# Patient Record
Sex: Female | Born: 1945 | Race: White | Hispanic: No | Smoking: Former smoker
Health system: Southern US, Community
[De-identification: ages and names within clinical notes are randomized; demographics above are authoritative.]

## PROBLEM LIST (undated history)

## (undated) DIAGNOSIS — K219 Gastro-esophageal reflux disease without esophagitis: Secondary | ICD-10-CM

## (undated) DIAGNOSIS — C801 Malignant (primary) neoplasm, unspecified: Secondary | ICD-10-CM

## (undated) DIAGNOSIS — R002 Palpitations: Secondary | ICD-10-CM

## (undated) DIAGNOSIS — M1990 Unspecified osteoarthritis, unspecified site: Secondary | ICD-10-CM

## (undated) DIAGNOSIS — J40 Bronchitis, not specified as acute or chronic: Secondary | ICD-10-CM

## (undated) DIAGNOSIS — I10 Essential (primary) hypertension: Secondary | ICD-10-CM

## (undated) DIAGNOSIS — E785 Hyperlipidemia, unspecified: Secondary | ICD-10-CM

## (undated) DIAGNOSIS — I739 Peripheral vascular disease, unspecified: Secondary | ICD-10-CM

## (undated) DIAGNOSIS — J189 Pneumonia, unspecified organism: Secondary | ICD-10-CM

## (undated) HISTORY — DX: Malignant (primary) neoplasm, unspecified: C80.1

## (undated) HISTORY — DX: Hyperlipidemia, unspecified: E78.5

## (undated) HISTORY — DX: Essential (primary) hypertension: I10

## (undated) HISTORY — DX: Bronchitis, not specified as acute or chronic: J40

## (undated) HISTORY — DX: Peripheral vascular disease, unspecified: I73.9

## (undated) HISTORY — DX: Unspecified osteoarthritis, unspecified site: M19.90

## (undated) HISTORY — DX: Pneumonia, unspecified organism: J18.9

## (undated) HISTORY — DX: Gastro-esophageal reflux disease without esophagitis: K21.9

## (undated) HISTORY — DX: Palpitations: R00.2

---

## 1969-02-10 HISTORY — PX: ABDOMINAL HYSTERECTOMY: SHX81

## 2002-02-10 HISTORY — PX: PR VEIN BYPASS GRAFT,AORTO-FEM-POP: 35551

## 2004-02-11 HISTORY — PX: VENTRAL HERNIA REPAIR: SHX424

## 2008-12-11 ENCOUNTER — Emergency Department (HOSPITAL_COMMUNITY): Admission: EM | Admit: 2008-12-11 | Discharge: 2008-12-11 | Payer: Self-pay | Admitting: Emergency Medicine

## 2010-03-14 ENCOUNTER — Encounter (INDEPENDENT_AMBULATORY_CARE_PROVIDER_SITE_OTHER): Payer: Medicare Other

## 2010-03-14 ENCOUNTER — Ambulatory Visit: Admit: 2010-03-14 | Payer: Self-pay | Admitting: Vascular Surgery

## 2010-03-14 ENCOUNTER — Encounter (INDEPENDENT_AMBULATORY_CARE_PROVIDER_SITE_OTHER): Payer: Medicare Other | Admitting: Vascular Surgery

## 2010-03-14 DIAGNOSIS — Z48812 Encounter for surgical aftercare following surgery on the circulatory system: Secondary | ICD-10-CM

## 2010-03-14 DIAGNOSIS — I739 Peripheral vascular disease, unspecified: Secondary | ICD-10-CM

## 2010-03-14 DIAGNOSIS — I70219 Atherosclerosis of native arteries of extremities with intermittent claudication, unspecified extremity: Secondary | ICD-10-CM

## 2010-03-25 NOTE — Assessment & Plan Note (Signed)
OFFICE VISIT  CHRISSI, CROW DOB:  October 15, 1945                                       03/14/2010 CHART#:[id number]  HISTORY OF PRESENT ILLNESS:  The patient is a 65 year old female referred by Dr. Duanne Guess for followup of peripheral arterial disease.  The patient had aortobifemoral bypass graft performed in 2004 in [place] by Dr. Addison Lank.  She recently moved to [place] from [place] 3 years ago and wanted to have followup with a vascular surgeon in the area. Apparently before her aortobifemoral bypass she had 3 toes on the right foot which were black in color and these healed up after her operation. Of note, she did have to have a ventral hernia repair with mesh in 2005 after her aortobifemoral bypass graft.  She has not really had any problems since then.  She currently denies any symptoms of claudication or rest pain.  She has had no problems of nonhealing ulcers.  CHRONIC MEDICAL PROBLEMS:  Include hypertension and elevated cholesterol.  These are both currently followed by Dr. Duanne Guess.  PAST SURGICAL HISTORY:  As mentioned above.  In addition, she had a hysterectomy in 1971.  SOCIAL HISTORY:  She is single.  She has 2 children.  She is a former smoker, quit in 2004.  She does not consume alcohol regularly.  FAMILY HISTORY:  Remarkable for her mother who had pancreatic cancer, her father who had coronary disease at age 19.  Her brothers have both had cardiac disease at age less than 76.  REVIEW OF SYSTEMS:  Full 12 point review of systems was performed with the patient today.  This was remarkable for some recent weight gain. She is 5 feet 4 inches, 210 pounds.  She has some occasional pain in her thighs when walking.  This did not sound like claudication, it sounded more like muscle strain. CARDIAC:  She has a history of palpitations. GI:  She has a history of reflux, hiatal hernia. NEUROLOGIC:  She has some occasional headaches and  dizziness. MUSCULOSKELETAL:  She has multiple joint and arthritis pain. URINARY:  She has some urinary frequency. SKIN:  She recently had a rash on her right pretibial area. All other systems were negative.  MEDICATIONS: 1. Include Lipitor 40 mg once a day. 2. Aspirin 325 mg once a day. 3. Lisinopril 20 mg once a day. 4. Loratadine 10 mg once a day. 5. Nexium 40 mg once a day.  ALLERGIES:  She has an allergy listed to mercurochrome.  PHYSICAL EXAMINATION:  Vital signs:  Blood pressure is 106/69 in the left arm, heart rate 65 and regular, respirations 16.  HEENT: Unremarkable.  Neck:  Has 2+ carotid pulses without bruit.  Chest: Clear to auscultation.  Cardiac:  Regular rate and rhythm without murmur.  Abdomen:  Obese, soft, nontender, nondistended.  Well-healed midline incision with no obvious hernia.  Musculoskeletal:  Shows no major joint deformities.  Neurologic:  Shows symmetric upper extremity and lower extremity motor strength which is 5/5.  Skin:  Has a papular rash on the right pretibial area.  Otherwise no other ulcers or rashes. Extremities:  She has 2+ radial, femoral and dorsalis pedis pulses bilaterally.  She had bilateral ABIs performed today which were 1.12 on the right, 1.05 on the left with biphasic normal waveforms.  In summary, the patient has a history of peripheral arterial disease. She currently  is asymptomatic.  She has had a previous aortobifemoral bypass graft.  Will continue to follow her long-term to make sure that she does not have these symptoms or recurrence of problems.  She will be scheduled for repeat ABIs in 1 year's time.  She will follow up sooner if she has any problems before then.    Janetta Hora. Aaleigha Bozza, MD Electronically Signed  CEF/MEDQ  D:  03/14/2010  T:  03/15/2010  Job:  [id number]  cc:   Maryelizabeth Rowan, M.D.

## 2010-04-30 ENCOUNTER — Other Ambulatory Visit: Payer: Self-pay | Admitting: Family Medicine

## 2010-04-30 DIAGNOSIS — M8580 Other specified disorders of bone density and structure, unspecified site: Secondary | ICD-10-CM

## 2010-04-30 DIAGNOSIS — M1990 Unspecified osteoarthritis, unspecified site: Secondary | ICD-10-CM

## 2010-04-30 DIAGNOSIS — Z1231 Encounter for screening mammogram for malignant neoplasm of breast: Secondary | ICD-10-CM

## 2010-05-10 ENCOUNTER — Ambulatory Visit
Admission: RE | Admit: 2010-05-10 | Discharge: 2010-05-10 | Disposition: A | Discharge status: Home / Self Care | Payer: Medicare Other | Source: Ambulatory Visit | Attending: Family Medicine | Admitting: Family Medicine

## 2010-05-10 DIAGNOSIS — M8580 Other specified disorders of bone density and structure, unspecified site: Secondary | ICD-10-CM

## 2010-05-10 DIAGNOSIS — Z1231 Encounter for screening mammogram for malignant neoplasm of breast: Secondary | ICD-10-CM

## 2011-03-18 ENCOUNTER — Encounter (INDEPENDENT_AMBULATORY_CARE_PROVIDER_SITE_OTHER): Payer: Medicare Other | Admitting: *Deleted

## 2011-03-18 DIAGNOSIS — Z01818 Encounter for other preprocedural examination: Secondary | ICD-10-CM

## 2011-03-18 DIAGNOSIS — I70309 Unspecified atherosclerosis of unspecified type of bypass graft(s) of the extremities, unspecified extremity: Secondary | ICD-10-CM

## 2011-03-18 DIAGNOSIS — I70409 Unspecified atherosclerosis of autologous vein bypass graft(s) of the extremities, unspecified extremity: Secondary | ICD-10-CM

## 2011-03-18 DIAGNOSIS — Z48812 Encounter for surgical aftercare following surgery on the circulatory system: Secondary | ICD-10-CM

## 2011-03-21 ENCOUNTER — Other Ambulatory Visit: Payer: Self-pay | Admitting: *Deleted

## 2011-03-21 DIAGNOSIS — Z48812 Encounter for surgical aftercare following surgery on the circulatory system: Secondary | ICD-10-CM

## 2011-03-21 DIAGNOSIS — I739 Peripheral vascular disease, unspecified: Secondary | ICD-10-CM

## 2011-03-24 ENCOUNTER — Encounter: Payer: Self-pay | Admitting: Vascular Surgery

## 2012-02-11 DIAGNOSIS — J189 Pneumonia, unspecified organism: Secondary | ICD-10-CM

## 2012-02-11 HISTORY — DX: Pneumonia, unspecified organism: J18.9

## 2012-03-09 ENCOUNTER — Encounter: Payer: Self-pay | Admitting: Vascular Surgery

## 2012-03-17 ENCOUNTER — Encounter: Payer: Self-pay | Admitting: Neurosurgery

## 2012-03-18 ENCOUNTER — Ambulatory Visit (INDEPENDENT_AMBULATORY_CARE_PROVIDER_SITE_OTHER): Payer: Medicare Other | Admitting: Neurosurgery

## 2012-03-18 ENCOUNTER — Encounter (INDEPENDENT_AMBULATORY_CARE_PROVIDER_SITE_OTHER): Payer: Medicare Other | Admitting: *Deleted

## 2012-03-18 ENCOUNTER — Encounter: Payer: Self-pay | Admitting: Neurosurgery

## 2012-03-18 VITALS — BP 90/54 | HR 76 | Resp 16 | Ht 65.5 in | Wt 194.0 lb

## 2012-03-18 DIAGNOSIS — Z48812 Encounter for surgical aftercare following surgery on the circulatory system: Secondary | ICD-10-CM

## 2012-03-18 DIAGNOSIS — I739 Peripheral vascular disease, unspecified: Secondary | ICD-10-CM

## 2012-03-18 DIAGNOSIS — M25559 Pain in unspecified hip: Secondary | ICD-10-CM

## 2012-03-18 DIAGNOSIS — R209 Unspecified disturbances of skin sensation: Secondary | ICD-10-CM

## 2012-03-18 DIAGNOSIS — R200 Anesthesia of skin: Secondary | ICD-10-CM | POA: Insufficient documentation

## 2012-03-18 NOTE — Progress Notes (Signed)
[facility] P[facility]/PVD Office Note  CC: PAD surveillance Referring Physician: Fields  History of Present Illness: 67 year old female patient of Dr. Darrick Penna who is status post aortobifemoral bypass graft in 2005. The patient denies claudication or rest pain. The patient does have some complaints of numbness in her right third digit of her hand which is most likely carpal tunel according to the patient.  Past Medical History  Diagnosis Date  . Hypertension   . Hyperlipidemia   . Heart palpitations   . GERD (gastroesophageal reflux disease)     With Hiatal Hernia  . Arthritis   . Bronchitis     ROS: [x]  Positive   [ ]  Denies    General: [ ]  Weight loss, [ ]  Fever, [ ]  chills Neurologic: [ ]  Dizziness, [ ]  Blackouts, [ ]  Seizure [ ]  Stroke, [ ]  "Mini stroke", [ ]  Slurred speech, [ ]  Temporary blindness; [ ]  weakness in arms or legs, [ ]  Hoarseness Cardiac: [ ]  Chest pain/pressure, [ ]  Shortness of breath at rest [ ]  Shortness of breath with exertion, [ ]  Atrial fibrillation or irregular heartbeat Vascular: [ ]  Pain in legs with walking, [ ]  Pain in legs at rest, [ ]  Pain in legs at night,  [ ]  Non-healing ulcer, [ ]  Blood clot in vein/DVT,   Pulmonary: [ ]  Home oxygen, [ ]  Productive cough, [ ]  Coughing up blood, [ ]  Asthma,  [ ]  Wheezing Musculoskeletal:  [ ]  Arthritis, [ ]  Low back pain, [ ]  Joint pain Hematologic: [ ]  Easy Bruising, [ ]  Anemia; [ ]  Hepatitis Gastrointestinal: [ ]  Blood in stool, [ ]  Gastroesophageal Reflux/heartburn, [ ]  Trouble swallowing Urinary: [ ]  chronic Kidney disease, [ ]  on HD - [ ]  MWF or [ ]  TTHS, [ ]  Burning with urination, [ ]  Difficulty urinating Skin: [ ]  Rashes, [ ]  Wounds Psychological: [ ]  Anxiety, [ ]  Depression   Social History History  Substance Use Topics  . Smoking status: Former Smoker    Quit date: 04/11/2002  . Smokeless tobacco: Not on file  . Alcohol Use: No    Family History Family History  Problem  Relation Age of Onset  . Cancer Mother   . Heart disease Father   . Heart disease Brother   . Heart disease Brother     No Known Allergies  Current Outpatient Prescriptions  Medication Sig Dispense Refill  . aspirin 325 MG tablet Take 325 mg by mouth daily.      Marland Kitchen atorvastatin (LIPITOR) 40 MG tablet Take 40 mg by mouth daily.      Marland Kitchen esomeprazole (NEXIUM) 40 MG capsule Take 40 mg by mouth daily before breakfast.      . lisinopril (PRINIVIL,ZESTRIL) 20 MG tablet Take 20 mg by mouth daily.      Marland Kitchen loratadine (CLARITIN) 10 MG tablet Take 10 mg by mouth daily.        Physical Examination  Filed Vitals:   03/18/12 1404  BP: 90/54  Pulse: 76  Resp: 16    Body mass index is 31.79 kg/(m^2).  General:  WDWN in NAD Gait: Normal HEENT: WNL Eyes: Pupils equal Pulmonary: normal non-labored breathing , without Rales, rhonchi,  wheezing Cardiac: RRR, without  Murmurs, rubs or gallops; No carotid bruits Abdomen: soft, NT, no masses Skin: no rashes, ulcers noted Vascular Exam/Pulses: Palpable femoral and lower extremity pulses bilaterally  Extremities without ischemic changes, no Gangrene , no cellulitis; no open wounds;  Musculoskeletal: no muscle wasting or atrophy  Neurologic: A&O X 3; Appropriate Affect ; SENSATION: normal; MOTOR FUNCTION:  moving all extremities equally. Speech is fluent/normal  Non-Invasive Vascular Imaging: ABIs today were 0.94 and triphasic on the right, 1.08 and triphasic on the left which is consistent with previous exam one year ago  ASSESSMENT/PLAN: Asymptomatic patient will followup in one year with repeat ABIs. The patient's questions were encouraged and answered, she is in agreement with this plan.  Lauree Chandler ANP  Clinic M.D.: [facility]

## 2012-03-19 NOTE — Addendum Note (Signed)
Addended by: Sharee Pimple on: 03/19/2012 01:25 PM   Modules accepted: Orders

## 2013-03-17 ENCOUNTER — Encounter: Payer: Self-pay | Admitting: Family

## 2013-03-18 ENCOUNTER — Inpatient Hospital Stay (HOSPITAL_COMMUNITY): Admission: RE | Admit: 2013-03-18 | Payer: Medicare Other | Source: Ambulatory Visit

## 2013-03-18 ENCOUNTER — Ambulatory Visit: Payer: Medicare Other | Admitting: Family

## 2018-11-09 ENCOUNTER — Other Ambulatory Visit: Payer: Self-pay

## 2018-11-09 ENCOUNTER — Ambulatory Visit (HOSPITAL_COMMUNITY)
Admission: EM | Admit: 2018-11-09 | Discharge: 2018-11-09 | Disposition: A | Discharge status: Home / Self Care | Payer: Medicare Other | Attending: Family Medicine | Admitting: Family Medicine

## 2018-11-09 ENCOUNTER — Encounter (HOSPITAL_COMMUNITY): Payer: Self-pay | Admitting: Emergency Medicine

## 2018-11-09 DIAGNOSIS — H60332 Swimmer's ear, left ear: Secondary | ICD-10-CM

## 2018-11-09 DIAGNOSIS — H66002 Acute suppurative otitis media without spontaneous rupture of ear drum, left ear: Secondary | ICD-10-CM

## 2018-11-09 MED ORDER — FLUCONAZOLE 150 MG PO TABS: 150.0000 mg | ORAL_TABLET | Freq: Once | ORAL | 0 refills | Status: AC

## 2018-11-09 MED ORDER — AMOXICILLIN-POT CLAVULANATE 875-125 MG PO TABS: 1.0000 | ORAL_TABLET | Freq: Two times a day (BID) | ORAL | 0 refills | Status: AC

## 2018-11-09 MED ORDER — FLUTICASONE PROPIONATE 50 MCG/ACT NA SUSP: 1.0000 | Freq: Every day | NASAL | 0 refills | Status: AC

## 2018-11-09 MED ORDER — NEOMYCIN-POLYMYXIN-HC 3.5-10000-1 OT SUSP: 3.0000 [drp] | Freq: Three times a day (TID) | OTIC | 0 refills | Status: AC

## 2018-11-09 NOTE — ED Provider Notes (Signed)
[facility]    CSN: [id number] Arrival date & time: 11/09/18  1032      History   Chief Complaint Chief Complaint  Patient presents with  . Otalgia    HPI Diane Mendoza is a 73 y.o. female history of hypertension, hyperlipidemia, GERD, presenting today for evaluation of left ear pain.  Patient states that over the past week she initially had muffled sensation and sensation of water in her ear.  Recently she she has started to develop pain.  She feels as if her outer ear is itchy and wet.  She notes that she has history of previous issues with her ears.  She takes daily loratadine.  Denies fevers.  Denies associated congestion cough or sore throat.  Denies fevers.  HPI  Past Medical History:  Diagnosis Date  . Arthritis   . Bronchitis   . Cancer (HCC)    Cervical  . GERD (gastroesophageal reflux disease)    With Hiatal Hernia  . Heart palpitations   . Hyperlipidemia   . Hypertension   . Peripheral vascular disease ([place])   . Pneumonia Jan. 2014    Patient Active Problem List   Diagnosis Date Noted  . Peripheral vascular disease, unspecified ([place]) 03/18/2012  . Numbness of finger 03/18/2012  . Hip pain 03/18/2012    Past Surgical History:  Procedure Laterality Date  . ABDOMINAL HYSTERECTOMY  1971  . PR VEIN BYPASS GRAFT,AORTO-FEM-POP  2004   Done at [facility] in [place],  Dr. Gavin Pound  . VENTRAL HERNIA REPAIR  2006   x6 repairs with mesh insertion    OB History   No obstetric history on file.      Home Medications    Prior to Admission medications   Medication Sig Start Date End Date Taking? Authorizing Provider  amoxicillin-clavulanate (AUGMENTIN) 875-125 MG tablet Take 1 tablet by mouth every 12 (twelve) hours for 7 days. 11/09/18 11/16/18  Lakshya Mcgillicuddy C, PA-C  aspirin 325 MG tablet Take 325 mg by mouth daily.    [provider]  atorvastatin (LIPITOR) 40 MG tablet Take 40 mg by mouth daily.    [provider]   esomeprazole (NEXIUM) 40 MG capsule Take 40 mg by mouth daily before breakfast.    [provider]  fluconazole (DIFLUCAN) 150 MG tablet Take 1 tablet (150 mg total) by mouth once for 1 dose. 11/09/18 11/09/18  Randee Huston C, PA-C  fluticasone (FLONASE) 50 MCG/ACT nasal spray Place 1-2 sprays into both nostrils daily for 7 days. 11/09/18 11/16/18  Mataio Mele C, PA-C  lisinopril (PRINIVIL,ZESTRIL) 20 MG tablet Take 20 mg by mouth daily.    [provider]  lisinopril-hydrochlorothiazide (PRINZIDE,ZESTORETIC) 10-12.5 MG per tablet Take 1 tablet by mouth daily.    [provider]  loratadine (CLARITIN) 10 MG tablet Take 10 mg by mouth daily.    [provider]  neomycin-polymyxin-hydrocortisone (CORTISPORIN) 3.5-10000-1 OTIC suspension Place 3 drops into the left ear 3 (three) times daily for 7 days. 11/09/18 11/16/18  Geovani Tootle, Elesa Hacker, PA-C    Family History Family History  Problem Relation Age of Onset  . Cancer Mother        Pancreatic  . Heart disease Father        Heart Disease before age 24  . Cancer Father   . Diabetes Father   . Heart attack Father   . Heart disease Brother        Heart Disease before age 76  . Heart  attack Brother   . Heart disease Brother   . Heart attack Paternal Aunt   . Heart attack Paternal Uncle   . Stroke Paternal Grandmother     Social History Social History   Tobacco Use  . Smoking status: Former Smoker    Quit date: 04/11/2002    Years since quitting: 16.5  . Smokeless tobacco: Never Used  Substance Use Topics  . Alcohol use: No  . Drug use: No     Allergies   Chromium   Review of Systems Review of Systems  Constitutional: Negative for activity change, appetite change, chills, fatigue and fever.  HENT: Positive for ear discharge, ear pain and hearing loss. Negative for congestion, rhinorrhea, sinus pressure, sore throat and trouble swallowing.   Eyes: Negative for discharge and redness.   Respiratory: Negative for cough, chest tightness and shortness of breath.   Cardiovascular: Negative for chest pain.  Gastrointestinal: Negative for abdominal pain, diarrhea, nausea and vomiting.  Musculoskeletal: Negative for myalgias.  Skin: Negative for rash.  Neurological: Negative for dizziness, light-headedness and headaches.     Physical Exam Triage Vital Signs ED Triage Vitals  Enc Vitals Group     BP 11/09/18 1058 139/72     Pulse Rate 11/09/18 1058 77     Resp 11/09/18 1058 18     Temp 11/09/18 1058 97.6 F (36.4 C)     Temp Source 11/09/18 1058 Temporal     SpO2 11/09/18 1058 95 %     Weight --      Height --      Head Circumference --      Peak Flow --      Pain Score 11/09/18 1059 4     Pain Loc --      Pain Edu? --      Excl. in [place]? --    No data found.  Updated Vital Signs BP 139/72 (BP Location: Left Arm)   Pulse 77   Temp 97.6 F (36.4 C) (Temporal)   Resp 18   SpO2 95%   Visual Acuity Right Eye Distance:   Left Eye Distance:   Bilateral Distance:    Right Eye Near:   Left Eye Near:    Bilateral Near:     Physical Exam Vitals signs and nursing note reviewed.  Constitutional:      General: She is not in acute distress.    Appearance: She is well-developed.  HENT:     Head: Normocephalic and atraumatic.     Ears:     Comments: Right canal and TM without abnormality  Left canal edematous swollen and with debris present within canal, TM partially visualized and appears irregular and dull    Nose:     Comments: Nasal mucosa pink, nonswollen turbinates    Mouth/Throat:     Comments: Oral mucosa pink and moist, no tonsillar enlargement or exudate. Posterior pharynx patent and nonerythematous, no uvula deviation or swelling. Normal phonation.  Eyes:     Conjunctiva/sclera: Conjunctivae normal.  Neck:     Musculoskeletal: Neck supple.  Cardiovascular:     Rate and Rhythm: Normal rate and regular rhythm.     Heart sounds: No murmur.   Pulmonary:     Effort: Pulmonary effort is normal. No respiratory distress.     Breath sounds: Normal breath sounds.  Abdominal:     Palpations: Abdomen is soft.     Tenderness: There is no abdominal tenderness.  Skin:    General: Skin is  warm and dry.  Neurological:     Mental Status: She is alert.      UC Treatments / Results  Labs (all labs ordered are listed, but only abnormal results are displayed) Labs Reviewed - No data to display  EKG   Radiology No results found.  Procedures Procedures (including critical care time)  Medications Ordered in UC Medications - No data to display  Initial Impression / Assessment and Plan / UC Course  I have reviewed the triage vital signs and the nursing notes.  Pertinent labs & imaging results that were available during my care of the patient were reviewed by me and considered in my medical decision making (see chart for details).     Patient appears to have otitis media and externa.  Will initiate on Augmentin as well as provide Cortisporin to help with itching/pain sensation.  Also initiating on Flonase to further help with any underlying eustachian tube dysfunction that contributed to onset of symptoms.  Diflucan for any yeast symptoms.  Monitor for resolution,Discussed strict return precautions. Patient verbalized understanding and is agreeable with plan.  Final Clinical Impressions(s) / UC Diagnoses   Final diagnoses:  Non-recurrent acute suppurative otitis media of left ear without spontaneous rupture of tympanic membrane  Acute swimmer's ear of left side     Discharge Instructions     Begin taking Augmentin twice daily for the next week to treat your infection.  Please take with food on stomach to avoid diarrhea. You may also use Cortisporin eardrops in left ear to help with itching swelling and pain Flonase nasal spray 1 to 2 spray in each nostril daily, continue Claritin/loratadine May use Diflucan if developing  symptoms of yeast Keep ear clean and dry  Follow-up if not resolving   ED Prescriptions    Medication Sig Dispense Auth. Provider   amoxicillin-clavulanate (AUGMENTIN) 875-125 MG tablet Take 1 tablet by mouth every 12 (twelve) hours for 7 days. 14 tablet Honi Name C, PA-C   fluconazole (DIFLUCAN) 150 MG tablet Take 1 tablet (150 mg total) by mouth once for 1 dose. 2 tablet Patryce Depriest C, PA-C   fluticasone (FLONASE) 50 MCG/ACT nasal spray Place 1-2 sprays into both nostrils daily for 7 days. 1 g Jahrell Hamor C, PA-C   neomycin-polymyxin-hydrocortisone (CORTISPORIN) 3.5-10000-1 OTIC suspension Place 3 drops into the left ear 3 (three) times daily for 7 days. 10 mL Mayumi Summerson, [place] C, PA-C     PDMP not reviewed this encounter.   Janith Lima, [place] 11/09/18 1130

## 2018-11-09 NOTE — Discharge Instructions (Signed)
Begin taking Augmentin twice daily for the next week to treat your infection.  Please take with food on stomach to avoid diarrhea. You may also use Cortisporin eardrops in left ear to help with itching swelling and pain Flonase nasal spray 1 to 2 spray in each nostril daily, continue Claritin/loratadine May use Diflucan if developing symptoms of yeast Keep ear clean and dry  Follow-up if not resolving

## 2018-11-09 NOTE — ED Triage Notes (Signed)
Pt sts left ear pain

## 2020-02-08 ENCOUNTER — Encounter (HOSPITAL_COMMUNITY): Payer: Self-pay | Admitting: Emergency Medicine

## 2020-02-08 ENCOUNTER — Inpatient Hospital Stay (HOSPITAL_COMMUNITY)
Admission: EM | Admit: 2020-02-08 | Discharge: 2020-03-13 | DRG: 871 | Disposition: E | Payer: Medicare PPO | Attending: Internal Medicine | Admitting: Internal Medicine

## 2020-02-08 ENCOUNTER — Other Ambulatory Visit: Payer: Self-pay

## 2020-02-08 ENCOUNTER — Inpatient Hospital Stay (HOSPITAL_COMMUNITY): Payer: Medicare PPO

## 2020-02-08 ENCOUNTER — Encounter (HOSPITAL_COMMUNITY): Payer: Self-pay

## 2020-02-08 ENCOUNTER — Emergency Department (HOSPITAL_COMMUNITY): Payer: Medicare PPO

## 2020-02-08 ENCOUNTER — Ambulatory Visit (HOSPITAL_COMMUNITY)
Admission: EM | Admit: 2020-02-08 | Discharge: 2020-02-08 | Disposition: A | Discharge status: Home / Self Care | Payer: Medicare PPO

## 2020-02-08 DIAGNOSIS — U071 COVID-19: Secondary | ICD-10-CM | POA: Diagnosis present

## 2020-02-08 DIAGNOSIS — R739 Hyperglycemia, unspecified: Secondary | ICD-10-CM | POA: Diagnosis present

## 2020-02-08 DIAGNOSIS — E739 Lactose intolerance, unspecified: Secondary | ICD-10-CM | POA: Diagnosis present

## 2020-02-08 DIAGNOSIS — Z833 Family history of diabetes mellitus: Secondary | ICD-10-CM

## 2020-02-08 DIAGNOSIS — Z87891 Personal history of nicotine dependence: Secondary | ICD-10-CM

## 2020-02-08 DIAGNOSIS — I10 Essential (primary) hypertension: Secondary | ICD-10-CM | POA: Diagnosis present

## 2020-02-08 DIAGNOSIS — R0600 Dyspnea, unspecified: Secondary | ICD-10-CM | POA: Diagnosis not present

## 2020-02-08 DIAGNOSIS — Z90710 Acquired absence of both cervix and uterus: Secondary | ICD-10-CM | POA: Diagnosis not present

## 2020-02-08 DIAGNOSIS — A084 Viral intestinal infection, unspecified: Secondary | ICD-10-CM | POA: Diagnosis present

## 2020-02-08 DIAGNOSIS — J159 Unspecified bacterial pneumonia: Secondary | ICD-10-CM | POA: Diagnosis present

## 2020-02-08 DIAGNOSIS — Z6833 Body mass index (BMI) 33.0-33.9, adult: Secondary | ICD-10-CM | POA: Diagnosis not present

## 2020-02-08 DIAGNOSIS — N179 Acute kidney failure, unspecified: Secondary | ICD-10-CM | POA: Diagnosis present

## 2020-02-08 DIAGNOSIS — E871 Hypo-osmolality and hyponatremia: Secondary | ICD-10-CM | POA: Diagnosis present

## 2020-02-08 DIAGNOSIS — Z515 Encounter for palliative care: Secondary | ICD-10-CM

## 2020-02-08 DIAGNOSIS — Z66 Do not resuscitate: Secondary | ICD-10-CM | POA: Diagnosis not present

## 2020-02-08 DIAGNOSIS — R54 Age-related physical debility: Secondary | ICD-10-CM | POA: Diagnosis present

## 2020-02-08 DIAGNOSIS — Z885 Allergy status to narcotic agent status: Secondary | ICD-10-CM

## 2020-02-08 DIAGNOSIS — Z888 Allergy status to other drugs, medicaments and biological substances status: Secondary | ICD-10-CM

## 2020-02-08 DIAGNOSIS — F419 Anxiety disorder, unspecified: Secondary | ICD-10-CM | POA: Diagnosis present

## 2020-02-08 DIAGNOSIS — E1165 Type 2 diabetes mellitus with hyperglycemia: Secondary | ICD-10-CM | POA: Diagnosis not present

## 2020-02-08 DIAGNOSIS — J9601 Acute respiratory failure with hypoxia: Secondary | ICD-10-CM | POA: Diagnosis present

## 2020-02-08 DIAGNOSIS — J1282 Pneumonia due to coronavirus disease 2019: Secondary | ICD-10-CM | POA: Diagnosis present

## 2020-02-08 DIAGNOSIS — E669 Obesity, unspecified: Secondary | ICD-10-CM | POA: Diagnosis present

## 2020-02-08 DIAGNOSIS — Z79899 Other long term (current) drug therapy: Secondary | ICD-10-CM

## 2020-02-08 DIAGNOSIS — I739 Peripheral vascular disease, unspecified: Secondary | ICD-10-CM | POA: Diagnosis present

## 2020-02-08 DIAGNOSIS — K922 Gastrointestinal hemorrhage, unspecified: Secondary | ICD-10-CM

## 2020-02-08 DIAGNOSIS — Z823 Family history of stroke: Secondary | ICD-10-CM

## 2020-02-08 DIAGNOSIS — R0902 Hypoxemia: Secondary | ICD-10-CM

## 2020-02-08 DIAGNOSIS — Z8541 Personal history of malignant neoplasm of cervix uteri: Secondary | ICD-10-CM

## 2020-02-08 DIAGNOSIS — L409 Psoriasis, unspecified: Secondary | ICD-10-CM | POA: Diagnosis present

## 2020-02-08 DIAGNOSIS — M8580 Other specified disorders of bone density and structure, unspecified site: Secondary | ICD-10-CM | POA: Diagnosis present

## 2020-02-08 DIAGNOSIS — E876 Hypokalemia: Secondary | ICD-10-CM | POA: Diagnosis not present

## 2020-02-08 DIAGNOSIS — E785 Hyperlipidemia, unspecified: Secondary | ICD-10-CM | POA: Diagnosis present

## 2020-02-08 DIAGNOSIS — K219 Gastro-esophageal reflux disease without esophagitis: Secondary | ICD-10-CM | POA: Diagnosis present

## 2020-02-08 DIAGNOSIS — R710 Precipitous drop in hematocrit: Secondary | ICD-10-CM | POA: Diagnosis not present

## 2020-02-08 DIAGNOSIS — I4891 Unspecified atrial fibrillation: Secondary | ICD-10-CM

## 2020-02-08 DIAGNOSIS — A4189 Other specified sepsis: Principal | ICD-10-CM | POA: Diagnosis present

## 2020-02-08 DIAGNOSIS — Z8249 Family history of ischemic heart disease and other diseases of the circulatory system: Secondary | ICD-10-CM

## 2020-02-08 DIAGNOSIS — E860 Dehydration: Secondary | ICD-10-CM | POA: Diagnosis present

## 2020-02-08 DIAGNOSIS — Z95828 Presence of other vascular implants and grafts: Secondary | ICD-10-CM

## 2020-02-08 DIAGNOSIS — Z800 Family history of malignant neoplasm of digestive organs: Secondary | ICD-10-CM

## 2020-02-08 DIAGNOSIS — A419 Sepsis, unspecified organism: Secondary | ICD-10-CM

## 2020-02-08 DIAGNOSIS — F32A Depression, unspecified: Secondary | ICD-10-CM | POA: Diagnosis present

## 2020-02-08 DIAGNOSIS — Z7982 Long term (current) use of aspirin: Secondary | ICD-10-CM

## 2020-02-08 DIAGNOSIS — I4819 Other persistent atrial fibrillation: Secondary | ICD-10-CM | POA: Diagnosis present

## 2020-02-08 DIAGNOSIS — T380X5A Adverse effect of glucocorticoids and synthetic analogues, initial encounter: Secondary | ICD-10-CM | POA: Diagnosis not present

## 2020-02-08 DIAGNOSIS — J90 Pleural effusion, not elsewhere classified: Secondary | ICD-10-CM | POA: Diagnosis present

## 2020-02-08 LAB — CBC
HCT: 45.4 % (ref 36.0–46.0)
Hemoglobin: 15.7 g/dL — ABNORMAL HIGH (ref 12.0–15.0)
MCH: 30.7 pg (ref 26.0–34.0)
MCHC: 34.6 g/dL (ref 30.0–36.0)
MCV: 88.7 fL (ref 80.0–100.0)
Platelets: 196 10*3/uL (ref 150–400)
RBC: 5.12 MIL/uL — ABNORMAL HIGH (ref 3.87–5.11)
RDW: 13.3 % (ref 11.5–15.5)
WBC: 5.7 10*3/uL (ref 4.0–10.5)
nRBC: 0 % (ref 0.0–0.2)

## 2020-02-08 LAB — LACTATE DEHYDROGENASE: LDH: 360 U/L — ABNORMAL HIGH (ref 98–192)

## 2020-02-08 LAB — BASIC METABOLIC PANEL
Anion gap: 13 (ref 5–15)
BUN: 20 mg/dL (ref 8–23)
CO2: 21 mmol/L — ABNORMAL LOW (ref 22–32)
Calcium: 8.2 mg/dL — ABNORMAL LOW (ref 8.9–10.3)
Chloride: 96 mmol/L — ABNORMAL LOW (ref 98–111)
Creatinine, Ser: 1.2 mg/dL — ABNORMAL HIGH (ref 0.44–1.00)
GFR, Estimated: 48 mL/min — ABNORMAL LOW (ref >60)
Glucose, Bld: 172 mg/dL — ABNORMAL HIGH (ref 70–99)
Potassium: 3.9 mmol/L (ref 3.5–5.1)
Sodium: 130 mmol/L — ABNORMAL LOW (ref 135–145)

## 2020-02-08 LAB — LACTIC ACID, PLASMA: Lactic Acid, Venous: 1.5 mmol/L (ref 0.5–1.9)

## 2020-02-08 LAB — HEPATIC FUNCTION PANEL
ALT: 67 U/L — ABNORMAL HIGH (ref 0–44)
AST: 77 U/L — ABNORMAL HIGH (ref 15–41)
Albumin: 2.7 g/dL — ABNORMAL LOW (ref 3.5–5.0)
Alkaline Phosphatase: 42 U/L (ref 38–126)
Bilirubin, Direct: 0.2 mg/dL (ref 0.0–0.2)
Indirect Bilirubin: 0.3 mg/dL (ref 0.3–0.9)
Total Bilirubin: 0.5 mg/dL (ref 0.3–1.2)
Total Protein: 5.6 g/dL — ABNORMAL LOW (ref 6.5–8.1)

## 2020-02-08 LAB — TSH: TSH: 1.246 u[IU]/mL (ref 0.350–4.500)

## 2020-02-08 LAB — POC SARS CORONAVIRUS 2 AG -  ED: SARS Coronavirus 2 Ag: NEGATIVE

## 2020-02-08 LAB — RESP PANEL BY RT-PCR (FLU A&B, COVID) ARPGX2
Influenza A by PCR: NEGATIVE
Influenza B by PCR: NEGATIVE
SARS Coronavirus 2 by RT PCR: POSITIVE — AB

## 2020-02-08 LAB — CBG MONITORING, ED: Glucose-Capillary: 190 mg/dL — ABNORMAL HIGH (ref 70–99)

## 2020-02-08 LAB — FERRITIN: Ferritin: 2256 ng/mL — ABNORMAL HIGH (ref 11–307)

## 2020-02-08 LAB — FIBRINOGEN: Fibrinogen: 466 mg/dL (ref 210–475)

## 2020-02-08 LAB — C-REACTIVE PROTEIN: CRP: 11.8 mg/dL — ABNORMAL HIGH (ref <1.0)

## 2020-02-08 LAB — PROCALCITONIN: Procalcitonin: 18.67 ng/mL

## 2020-02-08 LAB — LIPASE, BLOOD: Lipase: 29 U/L (ref 11–51)

## 2020-02-08 LAB — POC OCCULT BLOOD, ED: Fecal Occult Bld: POSITIVE — AB

## 2020-02-08 LAB — D-DIMER, QUANTITATIVE: D-Dimer, Quant: 2.39 ug/mL-FEU — ABNORMAL HIGH (ref 0.00–0.50)

## 2020-02-08 MED ORDER — PROCHLORPERAZINE EDISYLATE 10 MG/2ML IJ SOLN
10.0000 mg | Freq: Once | INTRAMUSCULAR | Status: AC
  Administered 2020-02-08: 20:00:00 10 mg via INTRAVENOUS
  Filled 2020-02-08: qty 2

## 2020-02-08 MED ORDER — SODIUM CHLORIDE 0.9 % IV SOLN
80.0000 mg | Freq: Once | INTRAVENOUS | Status: AC
  Administered 2020-02-08: 80 mg via INTRAVENOUS
  Filled 2020-02-08: qty 80

## 2020-02-08 MED ORDER — GUAIFENESIN-DM 100-10 MG/5ML PO SYRP
10.0000 mL | ORAL_SOLUTION | ORAL | Status: DC | PRN
  Administered 2020-02-10 – 2020-02-26 (×2): 10 mL via ORAL
  Filled 2020-02-08 (×2): qty 10

## 2020-02-08 MED ORDER — HYDROCOD POLST-CPM POLST ER 10-8 MG/5ML PO SUER
5.0000 mL | Freq: Two times a day (BID) | ORAL | Status: DC | PRN
  Administered 2020-02-12: 5 mL via ORAL
  Filled 2020-02-08: qty 5

## 2020-02-08 MED ORDER — SODIUM CHLORIDE 0.9 % IV SOLN
100.0000 mg | Freq: Every day | INTRAVENOUS | Status: DC
  Administered 2020-02-09 – 2020-02-10 (×2): 100 mg via INTRAVENOUS
  Filled 2020-02-08: qty 20
  Filled 2020-02-08: qty 100
  Filled 2020-02-08: qty 20

## 2020-02-08 MED ORDER — SODIUM CHLORIDE 0.9 % IV SOLN: 200.0000 mg | Freq: Once | INTRAVENOUS | Status: DC

## 2020-02-08 MED ORDER — ONDANSETRON HCL 4 MG/2ML IJ SOLN
4.0000 mg | Freq: Four times a day (QID) | INTRAMUSCULAR | Status: DC | PRN
  Administered 2020-02-08 – 2020-02-15 (×10): 4 mg via INTRAVENOUS
  Filled 2020-02-08 (×11): qty 2

## 2020-02-08 MED ORDER — SODIUM CHLORIDE 0.9 % IV BOLUS: 500.0000 mL | Freq: Once | INTRAVENOUS | Status: DC

## 2020-02-08 MED ORDER — SODIUM CHLORIDE 0.9 % IV BOLUS
1000.0000 mL | Freq: Once | INTRAVENOUS | Status: AC
  Administered 2020-02-08: 18:00:00 1000 mL via INTRAVENOUS

## 2020-02-08 MED ORDER — DILTIAZEM HCL-DEXTROSE 125-5 MG/125ML-% IV SOLN (PREMIX)
10.0000 mg/h | INTRAVENOUS | Status: DC
  Administered 2020-02-08: 18:00:00 10 mg/h via INTRAVENOUS
  Filled 2020-02-08: qty 125

## 2020-02-08 MED ORDER — VITAMIN D 25 MCG (1000 UNIT) PO TABS
1000.0000 [IU] | ORAL_TABLET | Freq: Every day | ORAL | Status: DC
  Administered 2020-02-08 – 2020-02-29 (×20): 1000 [IU] via ORAL
  Filled 2020-02-08 (×21): qty 1

## 2020-02-08 MED ORDER — METHYLPREDNISOLONE SODIUM SUCC 125 MG IJ SOLR
45.0000 mg | Freq: Two times a day (BID) | INTRAMUSCULAR | Status: AC
  Administered 2020-02-08 – 2020-02-11 (×6): 45 mg via INTRAVENOUS
  Filled 2020-02-08 (×6): qty 2

## 2020-02-08 MED ORDER — SODIUM CHLORIDE 0.9 % IV SOLN: INTRAVENOUS | Status: AC

## 2020-02-08 MED ORDER — ASCORBIC ACID 500 MG PO TABS
500.0000 mg | ORAL_TABLET | Freq: Every day | ORAL | Status: DC
  Administered 2020-02-08 – 2020-02-29 (×20): 500 mg via ORAL
  Filled 2020-02-08 (×21): qty 1

## 2020-02-08 MED ORDER — ALBUTEROL SULFATE HFA 108 (90 BASE) MCG/ACT IN AERS
1.0000 | INHALATION_SPRAY | RESPIRATORY_TRACT | Status: DC | PRN
  Administered 2020-02-12 – 2020-02-29 (×3): 2 via RESPIRATORY_TRACT
  Filled 2020-02-08 (×2): qty 6.7

## 2020-02-08 MED ORDER — ZINC SULFATE 220 (50 ZN) MG PO CAPS
220.0000 mg | ORAL_CAPSULE | Freq: Every day | ORAL | Status: DC
  Administered 2020-02-08 – 2020-02-29 (×20): 220 mg via ORAL
  Filled 2020-02-08 (×21): qty 1

## 2020-02-08 MED ORDER — DILTIAZEM HCL-DEXTROSE 125-5 MG/125ML-% IV SOLN (PREMIX)
5.0000 mg/h | INTRAVENOUS | Status: DC
  Administered 2020-02-08: 19:00:00 10 mg/h via INTRAVENOUS
  Administered 2020-02-09: 03:00:00 12.5 mg/h via INTRAVENOUS
  Filled 2020-02-08: qty 125

## 2020-02-08 MED ORDER — IOHEXOL 350 MG/ML SOLN
75.0000 mL | Freq: Once | INTRAVENOUS | Status: AC | PRN
  Administered 2020-02-09: 75 mL via INTRAVENOUS

## 2020-02-08 MED ORDER — ACETAMINOPHEN 325 MG PO TABS
650.0000 mg | ORAL_TABLET | Freq: Four times a day (QID) | ORAL | Status: DC | PRN
  Administered 2020-02-09 – 2020-02-18 (×5): 650 mg via ORAL
  Filled 2020-02-08 (×7): qty 2

## 2020-02-08 MED ORDER — PREDNISONE 5 MG PO TABS
50.0000 mg | ORAL_TABLET | Freq: Every day | ORAL | Status: DC
  Filled 2020-02-08: qty 2

## 2020-02-08 MED ORDER — SODIUM CHLORIDE 0.9 % IV SOLN
8.0000 mg/h | INTRAVENOUS | Status: AC
  Administered 2020-02-08 – 2020-02-11 (×6): 8 mg/h via INTRAVENOUS
  Filled 2020-02-08 (×9): qty 80

## 2020-02-08 MED ORDER — ONDANSETRON HCL 4 MG/2ML IJ SOLN
4.0000 mg | Freq: Once | INTRAMUSCULAR | Status: AC
  Administered 2020-02-08: 18:00:00 4 mg via INTRAVENOUS
  Filled 2020-02-08: qty 2

## 2020-02-08 MED ORDER — PANTOPRAZOLE SODIUM 40 MG IV SOLR
40.0000 mg | Freq: Two times a day (BID) | INTRAVENOUS | Status: DC
  Administered 2020-02-12 – 2020-02-18 (×13): 40 mg via INTRAVENOUS
  Filled 2020-02-08 (×13): qty 40

## 2020-02-08 MED ORDER — ACETAMINOPHEN 325 MG PO TABS
650.0000 mg | ORAL_TABLET | Freq: Once | ORAL | Status: AC
  Administered 2020-02-08: 20:00:00 650 mg via ORAL
  Filled 2020-02-08: qty 2

## 2020-02-08 NOTE — ED Notes (Signed)
Patient is being discharged from the Urgent Care and sent to the Emergency Department via [facility]. Per Dr.Hagler, patient is in need of higher level of care due to significant illness/ further work up. Patient is aware and verbalizes understanding of plan of care.  Vitals:   01/18/2020 1005  BP: (!) 104/35  Pulse: (!) 102  Resp: (!) 24  Temp: 98 F (36.7 C)  SpO2: 92%

## 2020-02-08 NOTE — ED Provider Notes (Signed)
[facility] EMERGENCY DEPARTMENT Provider Note   CSN: [id number] Arrival date & time: 01/24/2020  1057     History Chief Complaint  Patient presents with  . Covid Positive  . Weakness    Diane Mendoza is a 74 y.o. female.  HPI 74 year old female with a history of cervical cancer, hyperlipidemia, hypertension, GERD, bypass surgery presents to the ER with complaints of nausea, vomiting, black stools over the last month.  She states that she was seen at a [facility] ER 2 days ago and tested positive for Covid.  She states that she was sent home and was told to treat her symptoms.  No history of A. fib, not on anticoagulation.  She arrived with a oxygen saturation at 90% on room air.  No home O2 requirements.  2 L nasal cannula applied.  Denies any chest pain or abdominal pain.  Per chart review, no positive Covid test in our system currently.  She is not vaccinated.  Per chart review, patient seen at [facility] with [place] on 12/26.  Originally thought that she tested +3 weeks ago, however this was incorrect.  Had taken an at home test and her son was also positive.  Originally there was not hypoxic.  EKG from that visit showed sinus rhythm with occasional PVC complexes.      Past Medical History:  Diagnosis Date  . Arthritis   . Bronchitis   . Cancer (HCC)    Cervical  . GERD (gastroesophageal reflux disease)    With Hiatal Hernia  . Heart palpitations   . Hyperlipidemia   . Hypertension   . Peripheral vascular disease ([place])   . Pneumonia Jan. 2014    Patient Active Problem List   Diagnosis Date Noted  . COVID-19 virus infection 02/07/2020  . Sepsis ([place]) 01/14/2020  . Acute hypoxemic respiratory failure ([place]) 01/20/2020  . GI bleed 01/29/2020  . Atrial fibrillation with rapid ventricular response ([place]) 01/14/2020  . Peripheral vascular disease, unspecified ([place]) 03/18/2012  . Numbness of finger 03/18/2012  . Hip pain 03/18/2012     Past Surgical History:  Procedure Laterality Date  . ABDOMINAL HYSTERECTOMY  1971  . PR VEIN BYPASS GRAFT,AORTO-FEM-POP  2004   Done at [facility] in [place],  Dr. Gavin Pound  . VENTRAL HERNIA REPAIR  2006   x6 repairs with mesh insertion     OB History   No obstetric history on file.     Family History  Problem Relation Age of Onset  . Cancer Mother        Pancreatic  . Heart disease Father        Heart Disease before age 15  . Cancer Father   . Diabetes Father   . Heart attack Father   . Heart disease Brother        Heart Disease before age 49  . Heart attack Brother   . Heart disease Brother   . Heart attack Paternal Aunt   . Heart attack Paternal Uncle   . Stroke Paternal Grandmother     Social History   Tobacco Use  . Smoking status: Former Smoker    Quit date: 04/11/2002    Years since quitting: 17.8  . Smokeless tobacco: Never Used  Substance Use Topics  . Alcohol use: No  . Drug use: No    Home Medications Prior to Admission medications   Medication Sig Start Date End Date Taking? Authorizing Provider  acetaminophen (TYLENOL) 325 MG tablet  Take 650 mg by mouth every 6 (six) hours as needed for mild pain, moderate pain or fever.   Yes [provider]  ibuprofen (ADVIL) 200 MG tablet Take 600 mg by mouth every 8 (eight) hours as needed for mild pain, moderate pain or headache.   Yes [provider]  fluticasone (FLONASE) 50 MCG/ACT nasal spray Place 1-2 sprays into both nostrils daily for 7 days. Patient not taking: Reported on 02/09/2020 11/09/18 11/16/18  Wieters, Madelynn Done C, PA-C    Allergies    Morphine and related, Chromium, and Lactose intolerance (gi)  Review of Systems   Review of Systems  Constitutional: Negative for chills and fever.  HENT: Negative for ear pain and sore throat.   Eyes: Negative for pain and visual disturbance.  Respiratory: Positive for cough and shortness of breath.   Cardiovascular: Positive for  palpitations. Negative for chest pain and leg swelling.  Gastrointestinal: Positive for blood in stool, nausea and vomiting. Negative for abdominal pain, anal bleeding and diarrhea.  Genitourinary: Negative for dysuria and hematuria.  Musculoskeletal: Negative for arthralgias and back pain.  Skin: Negative for color change and rash.  Neurological: Negative for seizures and syncope.  All other systems reviewed and are negative.   Physical Exam Updated Vital Signs BP (!) 156/83   Pulse (!) 101   Temp 98.1 F (36.7 C) (Oral)   Resp (!) 22   Ht 5\' 5"  (1.651 m)   Wt 89.4 kg   SpO2 91%   BMI 32.78 kg/m   Physical Exam Vitals and nursing note reviewed.  Constitutional:      General: She is not in acute distress.    Appearance: She is well-developed and well-nourished. She is obese. She is ill-appearing. She is not diaphoretic.  HENT:     Head: Normocephalic and atraumatic.  Eyes:     Conjunctiva/sclera: Conjunctivae normal.  Cardiovascular:     Rate and Rhythm: Normal rate. Rhythm irregular.     Pulses: Normal pulses.     Heart sounds: Normal heart sounds. No murmur heard.     Comments: Irregular rhythm, with rates between 115-130 Pulmonary:     Effort: Pulmonary effort is normal. No respiratory distress.     Comments: Mildly tachypneic, however speaking full sentences.  Breath sounds  decreased with mild rales in the bases Abdominal:     General: Abdomen is flat.     Palpations: Abdomen is soft.     Tenderness: There is no abdominal tenderness.  Genitourinary:    Rectum: Guaiac result positive.  Musculoskeletal:        General: No edema.     Cervical back: Normal range of motion and neck supple.  Skin:    General: Skin is warm and dry.     Capillary Refill: Capillary refill takes less than 2 seconds.     Findings: No erythema.  Neurological:     General: No focal deficit present.     Mental Status: She is alert and oriented to person, place, and time.  Psychiatric:         Mood and Affect: Mood and affect normal.     ED Results / Procedures / Treatments   Labs (all labs ordered are listed, but only abnormal results are displayed) Labs Reviewed  RESP PANEL BY RT-PCR (FLU A&B, COVID) ARPGX2 - Abnormal; Notable for the following components:      Result Value   SARS Coronavirus 2 by RT PCR POSITIVE (*)    All other  components within normal limits  BASIC METABOLIC PANEL - Abnormal; Notable for the following components:   Sodium 130 (*)    Chloride 96 (*)    CO2 21 (*)    Glucose, Bld 172 (*)    Creatinine, Ser 1.20 (*)    Calcium 8.2 (*)    GFR, Estimated 48 (*)    All other components within normal limits  CBC - Abnormal; Notable for the following components:   RBC 5.12 (*)    Hemoglobin 15.7 (*)    All other components within normal limits  FERRITIN - Abnormal; Notable for the following components:   Ferritin 2,256 (*)    All other components within normal limits  C-REACTIVE PROTEIN - Abnormal; Notable for the following components:   CRP 11.8 (*)    All other components within normal limits  LACTATE DEHYDROGENASE - Abnormal; Notable for the following components:   LDH 360 (*)    All other components within normal limits  D-DIMER, QUANTITATIVE (NOT AT [facility]) - Abnormal; Notable for the following components:   D-Dimer, Quant 2.39 (*)    All other components within normal limits  CBC WITH DIFFERENTIAL/PLATELET - Abnormal; Notable for the following components:   Lymphs Abs 0.4 (*)    All other components within normal limits  COMPREHENSIVE METABOLIC PANEL - Abnormal; Notable for the following components:   Sodium 130 (*)    CO2 17 (*)    Glucose, Bld 177 (*)    Calcium 7.6 (*)    Total Protein 5.4 (*)    Albumin 2.8 (*)    AST 59 (*)    ALT 60 (*)    All other components within normal limits  C-REACTIVE PROTEIN - Abnormal; Notable for the following components:   CRP 10.9 (*)    All other components within normal limits  D-DIMER,  QUANTITATIVE (NOT AT [facility]) - Abnormal; Notable for the following components:   D-Dimer, Quant 2.40 (*)    All other components within normal limits  HEPATIC FUNCTION PANEL - Abnormal; Notable for the following components:   Total Protein 5.6 (*)    Albumin 2.7 (*)    AST 77 (*)    ALT 67 (*)    All other components within normal limits  CBC WITH DIFFERENTIAL/PLATELET - Abnormal; Notable for the following components:   Lymphs Abs 0.5 (*)    All other components within normal limits  COMPREHENSIVE METABOLIC PANEL - Abnormal; Notable for the following components:   Glucose, Bld 177 (*)    Calcium 7.9 (*)    Total Protein 5.4 (*)    Albumin 2.6 (*)    All other components within normal limits  C-REACTIVE PROTEIN - Abnormal; Notable for the following components:   CRP 6.0 (*)    All other components within normal limits  D-DIMER, QUANTITATIVE (NOT AT [facility]) - Abnormal; Notable for the following components:   D-Dimer, Quant 2.41 (*)    All other components within normal limits  URINALYSIS, ROUTINE W REFLEX MICROSCOPIC - Abnormal; Notable for the following components:   APPearance HAZY (*)    Ketones, ur 20 (*)    Protein, ur 30 (*)    Bacteria, UA FEW (*)    All other components within normal limits  CBC WITH DIFFERENTIAL/PLATELET - Abnormal; Notable for the following components:   Neutro Abs 8.2 (*)    Lymphs Abs 0.4 (*)    Abs Immature Granulocytes 0.22 (*)    All other components within normal limits  COMPREHENSIVE METABOLIC PANEL - Abnormal; Notable for the following components:   Glucose, Bld 186 (*)    Calcium 7.8 (*)    Total Protein 5.5 (*)    Albumin 2.5 (*)    All other components within normal limits  C-REACTIVE PROTEIN - Abnormal; Notable for the following components:   CRP 2.7 (*)    All other components within normal limits  D-DIMER, QUANTITATIVE (NOT AT [facility]) - Abnormal; Notable for the following components:   D-Dimer, Quant 2.51 (*)    All other components  within normal limits  HEPARIN LEVEL (UNFRACTIONATED) - Abnormal; Notable for the following components:   Heparin Unfractionated 1.01 (*)    All other components within normal limits  APTT - Abnormal; Notable for the following components:   aPTT 66 (*)    All other components within normal limits  BRAIN NATRIURETIC PEPTIDE - Abnormal; Notable for the following components:   B Natriuretic Peptide 380.7 (*)    All other components within normal limits  C-REACTIVE PROTEIN - Abnormal; Notable for the following components:   CRP 2.7 (*)    All other components within normal limits  CBC WITH DIFFERENTIAL/PLATELET - Abnormal; Notable for the following components:   RBC 3.40 (*)    Hemoglobin 10.6 (*)    HCT 31.7 (*)    Neutro Abs 8.8 (*)    Lymphs Abs 0.4 (*)    Abs Immature Granulocytes 0.24 (*)    All other components within normal limits  COMPREHENSIVE METABOLIC PANEL - Abnormal; Notable for the following components:   Glucose, Bld 266 (*)    Calcium 7.9 (*)    Total Protein 5.3 (*)    Albumin 2.5 (*)    All other components within normal limits  D-DIMER, QUANTITATIVE (NOT AT [facility]) - Abnormal; Notable for the following components:   D-Dimer, Quant 1.59 (*)    All other components within normal limits  GLUCOSE, CAPILLARY - Abnormal; Notable for the following components:   Glucose-Capillary 203 (*)    All other components within normal limits  HEPARIN LEVEL (UNFRACTIONATED) - Abnormal; Notable for the following components:   Heparin Unfractionated 0.94 (*)    All other components within normal limits  HEMOGLOBIN A1C - Abnormal; Notable for the following components:   Hgb A1c MFr Bld 6.2 (*)    All other components within normal limits  GLUCOSE, CAPILLARY - Abnormal; Notable for the following components:   Glucose-Capillary 249 (*)    All other components within normal limits  C-REACTIVE PROTEIN - Abnormal; Notable for the following components:   CRP 2.6 (*)    All other  components within normal limits  COMPREHENSIVE METABOLIC PANEL - Abnormal; Notable for the following components:   Glucose, Bld 177 (*)    BUN 31 (*)    Calcium 8.0 (*)    Total Protein 5.0 (*)    Albumin 2.3 (*)    All other components within normal limits  D-DIMER, QUANTITATIVE (NOT AT [facility]) - Abnormal; Notable for the following components:   D-Dimer, Quant 3.84 (*)    All other components within normal limits  GLUCOSE, CAPILLARY - Abnormal; Notable for the following components:   Glucose-Capillary 205 (*)    All other components within normal limits  GLUCOSE, CAPILLARY - Abnormal; Notable for the following components:   Glucose-Capillary 158 (*)    All other components within normal limits  GLUCOSE, CAPILLARY - Abnormal; Notable for the following components:   Glucose-Capillary 150 (*)  All other components within normal limits  GLUCOSE, CAPILLARY - Abnormal; Notable for the following components:   Glucose-Capillary 159 (*)    All other components within normal limits  CBC WITH DIFFERENTIAL/PLATELET - Abnormal; Notable for the following components:   RBC 3.82 (*)    Hemoglobin 11.7 (*)    HCT 34.6 (*)    Lymphs Abs 0.5 (*)    Abs Immature Granulocytes 0.13 (*)    All other components within normal limits  GLUCOSE, CAPILLARY - Abnormal; Notable for the following components:   Glucose-Capillary 184 (*)    All other components within normal limits  GLUCOSE, CAPILLARY - Abnormal; Notable for the following components:   Glucose-Capillary 173 (*)    All other components within normal limits  GLUCOSE, CAPILLARY - Abnormal; Notable for the following components:   Glucose-Capillary 189 (*)    All other components within normal limits  GLUCOSE, CAPILLARY - Abnormal; Notable for the following components:   Glucose-Capillary 165 (*)    All other components within normal limits  GLUCOSE, CAPILLARY - Abnormal; Notable for the following components:   Glucose-Capillary 169 (*)    All  other components within normal limits  CBC - Abnormal; Notable for the following components:   WBC 10.6 (*)    All other components within normal limits  BASIC METABOLIC PANEL - Abnormal; Notable for the following components:   Sodium 146 (*)    Glucose, Bld 172 (*)    BUN 42 (*)    Calcium 8.0 (*)    All other components within normal limits  GLUCOSE, CAPILLARY - Abnormal; Notable for the following components:   Glucose-Capillary 153 (*)    All other components within normal limits  GLUCOSE, CAPILLARY - Abnormal; Notable for the following components:   Glucose-Capillary 169 (*)    All other components within normal limits  GLUCOSE, CAPILLARY - Abnormal; Notable for the following components:   Glucose-Capillary 182 (*)    All other components within normal limits  GLUCOSE, CAPILLARY - Abnormal; Notable for the following components:   Glucose-Capillary 166 (*)    All other components within normal limits  BASIC METABOLIC PANEL - Abnormal; Notable for the following components:   Potassium 3.4 (*)    Glucose, Bld 195 (*)    BUN 29 (*)    Calcium 7.8 (*)    All other components within normal limits  MAGNESIUM - Abnormal; Notable for the following components:   Magnesium 2.5 (*)    All other components within normal limits  GLUCOSE, CAPILLARY - Abnormal; Notable for the following components:   Glucose-Capillary 198 (*)    All other components within normal limits  GLUCOSE, CAPILLARY - Abnormal; Notable for the following components:   Glucose-Capillary 170 (*)    All other components within normal limits  GLUCOSE, CAPILLARY - Abnormal; Notable for the following components:   Glucose-Capillary 166 (*)    All other components within normal limits  GLUCOSE, CAPILLARY - Abnormal; Notable for the following components:   Glucose-Capillary 181 (*)    All other components within normal limits  GLUCOSE, CAPILLARY - Abnormal; Notable for the following components:   Glucose-Capillary 216  (*)    All other components within normal limits  GLUCOSE, CAPILLARY - Abnormal; Notable for the following components:   Glucose-Capillary 238 (*)    All other components within normal limits  MAGNESIUM - Abnormal; Notable for the following components:   Magnesium 2.5 (*)    All other components within normal  limits  COMPREHENSIVE METABOLIC PANEL - Abnormal; Notable for the following components:   Glucose, Bld 214 (*)    BUN 27 (*)    Calcium 7.9 (*)    Total Protein 5.1 (*)    Albumin 2.4 (*)    All other components within normal limits  GLUCOSE, CAPILLARY - Abnormal; Notable for the following components:   Glucose-Capillary 194 (*)    All other components within normal limits  GLUCOSE, CAPILLARY - Abnormal; Notable for the following components:   Glucose-Capillary 175 (*)    All other components within normal limits  GLUCOSE, CAPILLARY - Abnormal; Notable for the following components:   Glucose-Capillary 201 (*)    All other components within normal limits  GLUCOSE, CAPILLARY - Abnormal; Notable for the following components:   Glucose-Capillary 173 (*)    All other components within normal limits  GLUCOSE, CAPILLARY - Abnormal; Notable for the following components:   Glucose-Capillary 213 (*)    All other components within normal limits  GLUCOSE, CAPILLARY - Abnormal; Notable for the following components:   Glucose-Capillary 223 (*)    All other components within normal limits  GLUCOSE, CAPILLARY - Abnormal; Notable for the following components:   Glucose-Capillary 232 (*)    All other components within normal limits  GLUCOSE, CAPILLARY - Abnormal; Notable for the following components:   Glucose-Capillary 177 (*)    All other components within normal limits  CBG MONITORING, ED - Abnormal; Notable for the following components:   Glucose-Capillary 190 (*)    All other components within normal limits  POC OCCULT BLOOD, ED - Abnormal; Notable for the following components:    Fecal Occult Bld POSITIVE (*)    All other components within normal limits  CULTURE, BLOOD (ROUTINE X 2)  CULTURE, BLOOD (ROUTINE X 2)  LIPASE, BLOOD  FIBRINOGEN  PROCALCITONIN  TSH  LACTIC ACID, PLASMA  PROCALCITONIN  MAGNESIUM  PROCALCITONIN  MAGNESIUM  PHOSPHORUS  CBC WITH DIFFERENTIAL/PLATELET  COMPREHENSIVE METABOLIC PANEL  CBC  POC SARS CORONAVIRUS 2 AG -  ED    EKG EKG Interpretation  Date/Time:  Wednesday February 08 2020 11:43:42 EST Ventricular Rate:  112 PR Interval:    QRS Duration: 80 QT Interval:  320 QTC Calculation: 436 R Axis:   -32 Text Interpretation: Atrial fibrillation with rapid ventricular response Left axis deviation Septal infarct , age undetermined Abnormal ECG No STEMI Confirmed by Octaviano Glow [phone]) on 02/02/2020 4:10:52 PM   Radiology DG Chest Port 1 View  Result Date: 02/16/2020 CLINICAL DATA:  COVID pneumonia EXAM: PORTABLE CHEST 1 VIEW COMPARISON:  02/11/2020 FINDINGS: Pulmonary insufflation has decreased and lung volumes are small, though symmetric. Superimposed pulmonary infiltrates appear improved, particularly within the perihilar and right lower lung zone. No pneumothorax. Tiny bilateral pleural effusions may be present. Cardiac size within normal limits. No acute bone abnormality. Right upper extremity PICC line tip is seen at the superior cavoatrial junction. IMPRESSION: Improving multifocal pulmonary infiltrates, likely infectious. Diminishing pulmonary insufflation. Electronically Signed   By: Fidela Salisbury MD   On: 02/16/2020 05:17    Procedures .Critical Care Performed by: Garald Balding, PA-C Authorized by: Garald Balding, PA-C   Critical care provider statement:    Critical care time (minutes):  45   Critical care was necessary to treat or prevent imminent or life-threatening deterioration of the following conditions:  Cardiac failure and respiratory failure   Critical care was time spent personally by me on the  following activities:  Discussions  with consultants, evaluation of patient's response to treatment, examination of patient, ordering and performing treatments and interventions, ordering and review of laboratory studies, ordering and review of radiographic studies, pulse oximetry, re-evaluation of patient's condition, obtaining history from patient or surrogate and review of old charts   (including critical care time)  Medications Ordered in ED Medications  guaiFENesin-dextromethorphan (ROBITUSSIN DM) 100-10 MG/5ML syrup 10 mL (10 mLs Oral Given 01/15/2020 0013)  chlorpheniramine-HYDROcodone (TUSSIONEX) 10-8 MG/5ML suspension 5 mL (5 mLs Oral Given 02/19/2020 0503)  ascorbic acid (VITAMIN C) tablet 500 mg (500 mg Oral Given 02/16/20 0924)  zinc sulfate capsule 220 mg (220 mg Oral Given 02/16/20 0924)  cholecalciferol (VITAMIN D3) tablet 1,000 Units (1,000 Units Oral Given 02/16/20 0924)  acetaminophen (TYLENOL) tablet 650 mg (650 mg Oral Given 02/14/20 0930)  0.9 %  sodium chloride infusion (0 mLs Intravenous Stopped 02/09/20 1120)  pantoprazole (PROTONIX) 80 mg in sodium chloride 0.9 % 100 mL (0.8 mg/mL) infusion (0 mg/hr Intravenous Stopped 02/11/20 2324)  pantoprazole (PROTONIX) injection 40 mg (40 mg Intravenous Given 02/16/20 2205)  albuterol (VENTOLIN HFA) 108 (90 Base) MCG/ACT inhaler 1-2 puff (2 puffs Inhalation Given 03/11/2020 0941)  ondansetron (ZOFRAN) injection 4 mg (4 mg Intravenous Given 02/15/20 2236)  atorvastatin (LIPITOR) tablet 40 mg (40 mg Oral Given 02/16/20 0925)  promethazine (PHENERGAN) injection 6.25 mg (6.25 mg Intravenous Given 02/15/20 0455)  nystatin (MYCOSTATIN/NYSTOP) topical powder ( Topical Given 02/16/20 2209)  baricitinib (OLUMIANT) tablet 4 mg (4 mg Oral Given 02/16/20 0925)  sodium chloride flush (NS) 0.9 % injection 10-40 mL (20 mLs Intracatheter Given 02/16/20 2208)  Chlorhexidine Gluconate Cloth 2 % PADS 6 each (6 each Topical Given 02/15/20 0900)  chlorhexidine (PERIDEX) 0.12 % solution  15 mL (15 mLs Mouth Rinse Given 02/16/20 2208)  MEDLINE mouth rinse (15 mLs Mouth Rinse Given 02/16/20 1600)  sodium chloride (OCEAN) 0.65 % nasal spray 1 spray (1 spray Each Nare Given 02/29/2020 0942)  hydrOXYzine (ATARAX/VISTARIL) tablet 25 mg (25 mg Oral Given 02/15/20 2209)  labetalol (NORMODYNE) injection 10-20 mg (20 mg Intravenous Given 02/15/20 1815)  polyethylene glycol (MIRALAX / GLYCOLAX) packet 17 g (17 g Oral Given 02/16/20 2205)  feeding supplement (ENSURE ENLIVE / ENSURE PLUS) liquid 237 mL (237 mLs Oral Given 02/16/20 1400)  diltiazem (CARDIZEM) tablet 60 mg (60 mg Oral Given 02/16/20 2333)  methylPREDNISolone sodium succinate (SOLU-MEDROL) 40 mg/mL injection 40 mg (40 mg Intravenous Given 02/16/20 2209)  mometasone-formoterol (DULERA) 100-5 MCG/ACT inhaler 2 puff (2 puffs Inhalation Given 02/16/20 2025)  insulin aspart (novoLOG) injection 0-15 Units (3 Units Subcutaneous Given 02/16/20 2332)  ondansetron (ZOFRAN) injection 4 mg (4 mg Intravenous Given 03-03-20 1744)  sodium chloride 0.9 % bolus 1,000 mL (0 mLs Intravenous Stopped 03-03-20 1914)  prochlorperazine (COMPAZINE) injection 10 mg (10 mg Intravenous Given 03-03-20 1937)  acetaminophen (TYLENOL) tablet 650 mg (650 mg Oral Given 03-03-2020 1934)  methylPREDNISolone sodium succinate (SOLU-MEDROL) 125 mg/2 mL injection 45 mg (45 mg Intravenous Given 02/11/20 0841)  pantoprazole (PROTONIX) 80 mg in sodium chloride 0.9 % 100 mL IVPB (0 mg Intravenous Stopped 02/09/20 0039)  iohexol (OMNIPAQUE) 350 MG/ML injection 75 mL (75 mLs Intravenous Contrast Given 02/09/20 0008)  remdesivir 100 mg in sodium chloride 0.9 % 100 mL IVPB ( Intravenous Stopped 02/13/20 0956)  azithromycin (ZITHROMAX) 500 mg in sodium chloride 0.9 % 250 mL IVPB ( Intravenous Stopped 02/14/20 1816)  cefTRIAXone (ROCEPHIN) 2 g in sodium chloride 0.9 % 100 mL IVPB ( Intravenous Stopped 02/14/20 1802)  diltiazem (CARDIZEM) 1 mg/mL load via infusion 15 mg (15 mg Intravenous Bolus from Bag 02/11/20  0830)  digoxin (LANOXIN) 0.25 MG/ML injection 0.25 mg (0.25 mg Intravenous Given 02/11/20 1705)  furosemide (LASIX) injection 40 mg (40 mg Intravenous Given 02/11/20 1228)  furosemide (LASIX) injection 40 mg (40 mg Intravenous Given 03/05/2020 1154)  potassium chloride SA (KLOR-CON) CR tablet 40 mEq (40 mEq Oral Given 02/15/20 0639)  furosemide (LASIX) injection 40 mg (40 mg Intravenous Given 02/15/20 1400)  furosemide (LASIX) injection 40 mg (40 mg Intravenous Given 02/16/20 2013)    ED Course  I have reviewed the triage vital signs and the nursing notes.  Pertinent labs & imaging results that were available during my care of the patient were reviewed by me and considered in my medical decision making (see chart for details).  Clinical Course as of 02/16/20 2337  Wed Feb 08, 2020  1658 74 yo female here with covid tested positive 2 days (but feeling unwell for 1 month), having diarrhea, fatigue, fevers, malaise.  Not vaccinated for covid. Also found to be in new onset A Fib here.  Unclear how long in A Fib.  HR 130-150 bpm in the room.  Labs with Cr 1.2 (unknown baseline), Na 130, K 3.9, GFR 48.  Fecal occult positive but hgb 15.7.  Doubt massive GI bleed.  We'll give zofran for nausea, 1 L IVF, and start with 10 mg diltiazem for rate control.  She is also requiring 2L [place] for hypoxia.  She'll be admitted to the hospital for Covid hypoxia and new onset A Fib. [MT]    Clinical Course User Index [MT] Wyvonnia Dusky, MD   MDM Rules/Calculators/A&P                         74 year old female who tested positive for Covid 2 days ago, with nausea, vomiting, weakness, shortness of breath and dark stools.  On arrival, she is alert, oriented, speaking full sentences, chronically ill-appearing.  Vitals on arrival with hypoxia in the high 80s on room air.  Satting mid 90s on 2 L nasal cannula.  Afebrile.  Heart rate between 115-130, irregular consistent with new onset A. Fib.  Labs ordered in triage, reviewed and  interpreted by myself. CBC without leukocytosis, hemoglobin 15.7.  BMP with mild hyponatremia, CO2 of 21, evidence of dehydration with a creatinine of 1.2.  GFR 48.  Glucose of 190.  Hemoccult positive.  X-ray ordered in triage with no evidence of acute cardiopulmonary disease  EKG with evidence of A. fib with RVR.  MDM: Patient with new onset A. fib, heme positive stool.  COVID positive. Not anticoagulated.  Unclear when A. fib started.  We will start with gentle fluid rehydration, Cardizem. Zofran provided.   6:05pm: Reevaluation, patient still remains tachycardic with a rate between 130-150.  Still complaining of nausea.  Receiving fluids currently.  We will add Compazine, start on Cardizem drip.  Consulted hospitalist team. Spoke with Dr. Marlowe Sax who will admit   This was a shared visit with my supervising physician Dr. Langston Masker who independently saw and evaluated the patient & provided guidance in evaluation/management/disposition ,in agreement with care.     Final Clinical Impression(s) / ED Diagnoses Final diagnoses:  Hypoxia  S/P PICC central line placement  Pneumonia due to COVID-19 virus    Rx / DC Orders ED Discharge Orders    None       Garald Balding, PA-C 02/16/20  2337    Wyvonnia Dusky, MD 02/17/20 [phone]

## 2020-02-08 NOTE — ED Triage Notes (Addendum)
Patient has cough and congestion.  Patient reports a fever.  Patient complains of nausea, vomiting, diarrhea states has had these symptoms for 4 weeks, not getting any better Patient has had a positive covid test reported 2 days ago.

## 2020-02-08 NOTE — H&P (Signed)
History and Physical    Diane Mendoza [id number] DOB: [birth year] DOA: 01/25/2020  PCP: Pcp, No Patient coming from: Home  Chief Complaint: Generalized weakness, Covid positive   HPI: Diane Mendoza is a 73 y.o. female with medical history significant of hypertension, hyperlipidemia, PVD, GERD, history of cervical cancer status presenting the ED with complaints of generalized weakness after testing positive for COVID 3 days ago.  Patient states she has been feeling ill for the past 4 weeks.  Her symptoms include fevers, chills, cough, shortness of breath, nausea, vomiting, and diarrhea.  Also having dark stools for the past 4 weeks.  She does not take any blood thinners at home.  Patient states she was previously on aspirin but has not taken it for the past 4 years.  States she went to get tested for Covid at [facility] 3 days ago and the test came back positive.  Also states she had another Covid test done at home 2 days prior to going to [facility] and that was also positive.  Denies chest pain or palpitations.  She has not been vaccinated against Covid.  States she has other family members who are feeling ill at present and her son recently tested positive for Covid.  States she had a colonoscopy done over 10 years ago and was told it was normal.  She does not think she has ever had an endoscopy done   ED Course: Febrile with temperature 101.3 F.  Found to be in new onset A. fib with rate up to 130s. SPO2 88% on room air, improved with 2 L supplemental oxygen.  Tachypneic with respiratory rate up to 30s.  Not hypotensive.  WBC 5.7, hemoglobin 15.7, hematocrit 45.4, platelet 196K.  Sodium 130, potassium 3.9, chloride 96, bicarb 21, anion gap 13, BUN 20, creatinine 1.2, glucose 172.  Lipase normal.  FOBT positive.  Chest x-ray showing no acute disease.  Patient was started on Cardizem infusion.  Also given Tylenol, Zofran, Compazine, and 1 L normal saline bolus.  Review of Systems:  All systems reviewed and  apart from history of presenting illness, are negative.  Past Medical History:  Diagnosis Date  . Arthritis   . Bronchitis   . Cancer (HCC)    Cervical  . GERD (gastroesophageal reflux disease)    With Hiatal Hernia  . Heart palpitations   . Hyperlipidemia   . Hypertension   . Peripheral vascular disease ([place])   . Pneumonia Jan. 2014    Past Surgical History:  Procedure Laterality Date  . ABDOMINAL HYSTERECTOMY  1971  . PR VEIN BYPASS GRAFT,AORTO-FEM-POP  2004   Done at [facility] in [place],  Dr. Gavin Pound  . VENTRAL HERNIA REPAIR  2006   x6 repairs with mesh insertion     reports that she quit smoking about 17 years ago. She has never used smokeless tobacco. She reports that she does not drink alcohol and does not use drugs.  Allergies  Allergen Reactions  . Chromium     Family History  Problem Relation Age of Onset  . Cancer Mother        Pancreatic  . Heart disease Father        Heart Disease before age 92  . Cancer Father   . Diabetes Father   . Heart attack Father   . Heart disease Brother        Heart Disease before age 80  . Heart attack Brother   . Heart disease Brother   . Heart  attack Paternal Aunt   . Heart attack Paternal Uncle   . Stroke Paternal Grandmother     Prior to Admission medications   Medication Sig Start Date End Date Taking? Authorizing Provider  aspirin 325 MG tablet Take 325 mg by mouth daily.    [provider]  atorvastatin (LIPITOR) 40 MG tablet Take 40 mg by mouth daily.    [provider]  esomeprazole (NEXIUM) 40 MG capsule Take 40 mg by mouth daily before breakfast.    [provider]  fluticasone (FLONASE) 50 MCG/ACT nasal spray Place 1-2 sprays into both nostrils daily for 7 days. 11/09/18 11/16/18  Wieters, Hallie C, PA-C  lisinopril (PRINIVIL,ZESTRIL) 20 MG tablet Take 20 mg by mouth daily.    [provider]  lisinopril-hydrochlorothiazide (PRINZIDE,ZESTORETIC) 10-12.5 MG per  tablet Take 1 tablet by mouth daily.    [provider]  loratadine (CLARITIN) 10 MG tablet Take 10 mg by mouth daily.    [provider]  ondansetron (ZOFRAN-ODT) 4 MG disintegrating tablet Take by mouth. 02/06/20   [provider]    Physical Exam: Vitals:   01/18/2020 2000 01/20/2020 2015 01/29/2020 2030 02/10/2020 2100  BP: 129/62 113/60 (!) 134/59 (!) 145/63  Pulse: 79 (!) 103 78 (!) 48  Resp: (!) 35 (!) 32 (!) 27 (!) 33  Temp:      TempSrc:      SpO2: 90% 91% 90% 92%    Physical Exam Constitutional:      General: She is not in acute distress. HENT:     Head: Normocephalic and atraumatic.  Eyes:     Extraocular Movements: Extraocular movements intact.     Conjunctiva/sclera: Conjunctivae normal.  Cardiovascular:     Rate and Rhythm: Tachycardia present. Rhythm irregular.     Pulses: Normal pulses.  Pulmonary:     Breath sounds: No wheezing.     Comments: Slightly tachypneic with respiratory rate in the 20s Coarse breath sounds appreciated at the bases Abdominal:     General: Bowel sounds are normal. There is no distension.     Palpations: Abdomen is soft.     Tenderness: There is no abdominal tenderness. There is no guarding or rebound.  Musculoskeletal:        General: No swelling or tenderness.     Cervical back: Normal range of motion and neck supple.  Skin:    General: Skin is warm and dry.  Neurological:     General: No focal deficit present.     Mental Status: She is alert and oriented to person, place, and time.     Labs on Admission: I have personally reviewed following labs and imaging studies  CBC: Recent Labs  Lab 01/13/2020 1153  WBC 5.7  HGB 15.7*  HCT 45.4  MCV 88.7  PLT [id number]   Basic Metabolic Panel: Recent Labs  Lab 01/26/2020 1153  NA 130*  K 3.9  CL 96*  CO2 21*  GLUCOSE 172*  BUN 20  CREATININE 1.20*  CALCIUM 8.2*   GFR: CrCl cannot be calculated (Unknown ideal weight.). Liver Function Tests: No results for  input(s): AST, ALT, ALKPHOS, BILITOT, PROT, ALBUMIN in the last 168 hours. Recent Labs  Lab 01/19/2020 1153  LIPASE 29   No results for input(s): AMMONIA in the last 168 hours. Coagulation Profile: No results for input(s): INR, PROTIME in the last 168 hours. Cardiac Enzymes: No results for input(s): CKTOTAL, CKMB, CKMBINDEX, TROPONINI in the last 168 hours. BNP (last 3 results)  No results for input(s): PROBNP in the last 8760 hours. HbA1C: No results for input(s): HGBA1C in the last 72 hours. CBG: Recent Labs  Lab 02/07/2020 1148  GLUCAP 190*   Lipid Profile: No results for input(s): CHOL, HDL, LDLCALC, TRIG, CHOLHDL, LDLDIRECT in the last 72 hours. Thyroid Function Tests: No results for input(s): TSH, T4TOTAL, FREET4, T3FREE, THYROIDAB in the last 72 hours. Anemia Panel: No results for input(s): VITAMINB12, FOLATE, FERRITIN, TIBC, IRON, RETICCTPCT in the last 72 hours. Urine analysis: No results found for: COLORURINE, APPEARANCEUR, [place], [place], GLUCOSEU, HGBUR, BILIRUBINUR, KETONESUR, PROTEINUR, UROBILINOGEN, NITRITE, LEUKOCYTESUR  Radiological Exams on Admission: DG Chest Portable 1 View  Result Date: 01/22/2020 CLINICAL DATA:  Cough, congestion, nausea, vomiting, fever and diarrhea for 4 weeks. The patient tested positive for COVID-19 2 days ago. EXAM: PORTABLE CHEST 1 VIEW COMPARISON:  None. FINDINGS: The lungs are clear. Heart size is normal. Aortic atherosclerosis. No pneumothorax or pleural fluid. No acute or focal bony abnormality. IMPRESSION: No acute disease. Aortic Atherosclerosis (ICD10-I70.0). Electronically Signed   By: Inge Rise M.D.   On: 01/12/2020 12:20    EKG: Independently reviewed.  A. fib with RVR, no prior tracing for comparison.  Assessment/Plan Principal Problem:   COVID-19 virus infection Active Problems:   Sepsis ([place])   Acute hypoxemic respiratory failure (HCC)   GI bleed   Atrial fibrillation with rapid ventricular response (HCC)    Sepsis and acute hypoxemic respiratory failure secondary to suspected COVID-19 viral pneumonia: Meets sepsis criteria - 2 SIRS (fever, tachycardia, tachypnea) and has a source of infection. Rapid Covid antigen test done in the ED negative, however, patient had a positive SARS-CoV-2 PCR test on 02/05/2020 at [place] [facility] (result under Care Everywhere).  In addition, she also reports having a positive Covid test at home 2 days prior to her [facility] visit.  Chest x-ray not suggestive of pneumonia, however, suspicion for COVID-19 viral pneumonia remains high given hypoxemia.  SPO2 88% on room air, currently requiring 2 L supplemental oxygen. -Remdesivir -IV Solu-Medrol 0.5 mg/kg every 12 hours -Check inflammatory markers including ferritin, fibrinogen, D-dimer, CRP, LDH -Check procalcitonin level -Check lactic acid level -If D-dimer elevated, order CT angiogram chest to also assess for PE.  Otherwise order CT chest for further evaluation. -Vitamin C, zinc, vitamin D -Antitussives as needed -Tylenol as needed for fevers -Bronchodilator as needed -Daily CBC with differential, CMP, CRP, D-dimer -Airborne and contact precautions -Incentive spirometry, flutter valve -Encourage prone positioning -Continuous pulse ox -Supplemental oxygen as needed to keep oxygen saturation above 90% -Blood culture x2 ordered  Melena/ active GI bleed: Patient reports having melena for the past 4 weeks.  FOBT positive, however, hemoglobin stable at 15.7.  Aspirin is listed in home medications but patient states she has not taken it for the past 4 years.  Tachycardic in the setting of new onset A. fib with RVR.  Blood pressure stable. -IV PPI and bolus started.  I have sent a secure chat message to Dr. Havery Moros from [facility] requesting GI consultation.  New onset A. fib with RVR: No documented history of A. fib and found to be in A. fib with rate up to 130s on arrival to the ED.  She was started on Cardizem infusion.   Continues to be in A. fib but rate now improved to 100-110.  Possible precipitating factor is acute viral illness. -Continue Cardizem infusion. CHA2DS2VASc 4 - after discussion with the patient, decision has been made to hold off starting anticoagulation given positive  Hemoccult testing and concern for active GI bleed.  Echocardiogram ordered.  Check TSH level.  COVID-19 viral gastroenteritis: Patient is complaining of nausea, vomiting, and diarrhea.  Abdominal exam benign.  Lipase normal. -Check LFTs, antiemetic as needed, gentle IV fluid hydration  Mild hyponatremia: Likely due to poor oral intake, vomiting, and diarrhea in the setting of acute viral illness. -Gentle IV fluid hydration and continue to monitor sodium level  Mild normal anion gap metabolic acidosis: Likely due to diarrhea.  Bicarb 21, anion gap 13. -Gentle IV fluid hydration and continue to monitor metabolic panel  Mild AKI: Likely prerenal due to dehydration in the setting of vomiting and diarrhea.  BUN 20, creatinine 1.2.  Per labs under Care Everywhere, creatinine was previously 0.7-0.9. -Gentle IV fluid hydration and continue to monitor renal function  Hypertension: Stable. -Hold antihypertensives at this time given concern for sepsis  Hyperlipidemia -Resume home statin after pharmacy med rec is done.  DVT prophylaxis: SCDs Code Status: Patient wishes to be full code. Family Communication: No family available at this time. Disposition Plan: Status is: Inpatient  Remains inpatient appropriate because:Ongoing diagnostic testing needed not appropriate for outpatient work up, IV treatments appropriate due to intensity of illness or inability to take PO and Inpatient level of care appropriate due to severity of illness   Dispo: The patient is from: Home              Anticipated d/c is to: Home              Anticipated d/c date is: > 3 days              Patient currently is not medically stable to d/c.  The medical  decision making on this patient was of high complexity and the patient is at high risk for clinical deterioration, therefore this is a level 3 visit.  John Giovanni MD [facility]  If 7PM-7AM, please contact night-coverage [email or web address]  01/20/2020, 9:37 PM

## 2020-02-08 NOTE — ED Notes (Signed)
Pt placed on 2L [place] per 88% on RA

## 2020-02-08 NOTE — ED Notes (Signed)
Dr hagler spoke to patient in in-take room.  Patient to go to ED by EMS

## 2020-02-08 NOTE — ED Notes (Signed)
Notified [facility]

## 2020-02-08 NOTE — ED Triage Notes (Signed)
Pt bib ems for generalized weakness, pt tested positive for COVID 2 days ago but has been feeling bad for a month. Pt is not  Vaccinated for COVID. Pt 90% on room air. 2L [place] applied in triage

## 2020-02-08 NOTE — ED Notes (Signed)
Please call son peter when the patient is discharged  his number  is [phone]

## 2020-02-09 ENCOUNTER — Encounter (HOSPITAL_COMMUNITY): Payer: Self-pay | Admitting: Internal Medicine

## 2020-02-09 ENCOUNTER — Inpatient Hospital Stay (HOSPITAL_COMMUNITY): Payer: Medicare PPO

## 2020-02-09 DIAGNOSIS — I4891 Unspecified atrial fibrillation: Secondary | ICD-10-CM

## 2020-02-09 DIAGNOSIS — U071 COVID-19: Secondary | ICD-10-CM

## 2020-02-09 LAB — CBC WITH DIFFERENTIAL/PLATELET
Abs Immature Granulocytes: 0.02 10*3/uL (ref 0.00–0.07)
Basophils Absolute: 0 10*3/uL (ref 0.0–0.1)
Basophils Relative: 0 %
Eosinophils Absolute: 0 10*3/uL (ref 0.0–0.5)
Eosinophils Relative: 0 %
HCT: 40 % (ref 36.0–46.0)
Hemoglobin: 13.2 g/dL (ref 12.0–15.0)
Immature Granulocytes: 1 %
Lymphocytes Relative: 9 %
Lymphs Abs: 0.4 10*3/uL — ABNORMAL LOW (ref 0.7–4.0)
MCH: 30.3 pg (ref 26.0–34.0)
MCHC: 33 g/dL (ref 30.0–36.0)
MCV: 92 fL (ref 80.0–100.0)
Monocytes Absolute: 0.2 10*3/uL (ref 0.1–1.0)
Monocytes Relative: 4 %
Neutro Abs: 3.8 10*3/uL (ref 1.7–7.7)
Neutrophils Relative %: 86 %
Platelets: 151 10*3/uL (ref 150–400)
RBC: 4.35 MIL/uL (ref 3.87–5.11)
RDW: 13.4 % (ref 11.5–15.5)
WBC: 4.4 10*3/uL (ref 4.0–10.5)
nRBC: 0 % (ref 0.0–0.2)

## 2020-02-09 LAB — COMPREHENSIVE METABOLIC PANEL
ALT: 60 U/L — ABNORMAL HIGH (ref 0–44)
AST: 59 U/L — ABNORMAL HIGH (ref 15–41)
Albumin: 2.8 g/dL — ABNORMAL LOW (ref 3.5–5.0)
Alkaline Phosphatase: 41 U/L (ref 38–126)
Anion gap: 12 (ref 5–15)
BUN: 22 mg/dL (ref 8–23)
CO2: 17 mmol/L — ABNORMAL LOW (ref 22–32)
Calcium: 7.6 mg/dL — ABNORMAL LOW (ref 8.9–10.3)
Chloride: 101 mmol/L (ref 98–111)
Creatinine, Ser: 0.95 mg/dL (ref 0.44–1.00)
GFR, Estimated: >60 mL/min (ref >60)
Glucose, Bld: 177 mg/dL — ABNORMAL HIGH (ref 70–99)
Potassium: 4.1 mmol/L (ref 3.5–5.1)
Sodium: 130 mmol/L — ABNORMAL LOW (ref 135–145)
Total Bilirubin: 0.7 mg/dL (ref 0.3–1.2)
Total Protein: 5.4 g/dL — ABNORMAL LOW (ref 6.5–8.1)

## 2020-02-09 LAB — ECHOCARDIOGRAM COMPLETE
Area-P 1/2: 2.95 cm2
Height: 65 in
S' Lateral: 2.6 cm
Weight: 3152 oz

## 2020-02-09 LAB — D-DIMER, QUANTITATIVE: D-Dimer, Quant: 2.4 ug/mL-FEU — ABNORMAL HIGH (ref 0.00–0.50)

## 2020-02-09 LAB — C-REACTIVE PROTEIN: CRP: 10.9 mg/dL — ABNORMAL HIGH (ref <1.0)

## 2020-02-09 MED ORDER — ATORVASTATIN CALCIUM 40 MG PO TABS
40.0000 mg | ORAL_TABLET | Freq: Every day | ORAL | Status: DC
  Administered 2020-02-11 – 2020-02-29 (×18): 40 mg via ORAL
  Filled 2020-02-09 (×20): qty 1

## 2020-02-09 MED ORDER — SODIUM BICARBONATE 8.4 % IV SOLN
INTRAVENOUS | Status: DC
  Filled 2020-02-09 (×5): qty 1000

## 2020-02-09 NOTE — Consult Note (Addendum)
[facility] Consult: 8:39 AM 02/09/2020  LOS: 1 day    Referring Provider: Dr. Doristine Bosworth Primary Care Physician:  Minneola Cary Primary Gastroenterologist: unassigned.      Reason for Consultation:     HPI: Diane Mendoza is a 74 y.o. female.  Resides in [facility].   PMH HTN.  HLD.  Psoriasis.  Depression.  Cervical cancer, hysterectomy age 38.  Peripheral vascular disease.  S/p bil aorto-fem/pop) bypass grafting.  Multiple abdominal ventral hernia repairs, mesh in place.  Osteopenia. Colonoscopy greater than 10 years ago, patient states it was normal.  No previous EGD  Experiencing persistent dyspnea, weakness, fevers, congestion for at least a month. 5 days of black stools and anorexia, nausea, dry heaving.  Went to her local ED in [place] on 12/26, Covid test positive.  Blood pressure elevated, febrile, mild hypokalemia (3.3). Hgb 15.1.  Son says she received medications for nausea.  Plan was oral rehydration, oral potassium supplementation, prescription for Zofran as needed. For several days she has been alternating Tylenol with ibuprofen taking up to 1800 mg ibuprofen daily.  However the black stools started before the ibuprofen.  No abdominal pain.  Came to the [facility] ED today with ongoing respiratory symptoms of cough, congestion, weakness. Hgb 13.2.  Platelets, WBCs normal, MCV 92 Sodium 130. Normal T bili and alk phos.  AST/ALT 77/67  >> 59/60.  Transaminases were normal on 12/26 CXR without active or chronic disease CT angio chest: Multifocal patchy airspace disease bilaterally predominantly at the bases consistent with atypical viral pneumonia.   Past Medical History:  Diagnosis Date  . Arthritis   . Bronchitis   . Cancer (HCC)    Cervical  . GERD  (gastroesophageal reflux disease)    With Hiatal Hernia  . Heart palpitations   . Hyperlipidemia   . Hypertension   . Peripheral vascular disease ([place])   . Pneumonia Jan. 2014    Past Surgical History:  Procedure Laterality Date  . ABDOMINAL HYSTERECTOMY  1971  . PR VEIN BYPASS GRAFT,AORTO-FEM-POP  2004   Done at [facility] in [place],  Dr. Gavin Pound  . VENTRAL HERNIA REPAIR  2006   x6 repairs with mesh insertion    Prior to Admission medications   Medication Sig Start Date End Date Taking? Authorizing Provider  aspirin 325 MG tablet Take 325 mg by mouth daily.    [provider]  atorvastatin (LIPITOR) 40 MG tablet Take 40 mg by mouth daily.    [provider]  esomeprazole (NEXIUM) 40 MG capsule Take 40 mg by mouth daily before breakfast.    [provider]  fluticasone (FLONASE) 50 MCG/ACT nasal spray Place 1-2 sprays into both nostrils daily for 7 days. 11/09/18 11/16/18  Wieters, Hallie C, PA-C  lisinopril (PRINIVIL,ZESTRIL) 20 MG tablet Take 20 mg by mouth daily.    [provider]  lisinopril-hydrochlorothiazide (PRINZIDE,ZESTORETIC) 10-12.5 MG per tablet Take 1 tablet by mouth daily.    [provider]  loratadine (CLARITIN) 10 MG tablet Take 10 mg by  mouth daily.    [provider]  ondansetron (ZOFRAN-ODT) 4 MG disintegrating tablet Take by mouth. 02/06/20   [provider]    Scheduled Meds: . vitamin C  500 mg Oral Daily  . cholecalciferol  1,000 Units Oral Daily  . methylPREDNISolone (SOLU-MEDROL) injection  45 mg Intravenous Q12H   Followed by  . [START ON 02/11/2020] predniSONE  50 mg Oral Daily  . [START ON 03/12/2020] pantoprazole  40 mg Intravenous Q12H  . zinc sulfate  220 mg Oral Daily   Infusions: . diltiazem (CARDIZEM) infusion Stopped (01/18/2020 1845)  . diltiazem (CARDIZEM) infusion 7.5 mg/hr (02/09/20 0659)  . pantoprozole (PROTONIX) infusion 8 mg/hr (01/27/2020 2345)  . remdesivir 200 mg in  sodium chloride 0.9% 250 mL IVPB Stopped (01/18/2020 2121)   Followed by  . remdesivir 100 mg in NS 100 mL     PRN Meds: acetaminophen, albuterol, chlorpheniramine-HYDROcodone, guaiFENesin-dextromethorphan, ondansetron (ZOFRAN) IV   Allergies as of 02/07/2020 - Review Complete 01/11/2020  Allergen Reaction Noted  . Chromium  11/09/2018    Family History  Problem Relation Age of Onset  . Cancer Mother        Pancreatic  . Heart disease Father        Heart Disease before age 29  . Cancer Father   . Diabetes Father   . Heart attack Father   . Heart disease Brother        Heart Disease before age 50  . Heart attack Brother   . Heart disease Brother   . Heart attack Paternal Aunt   . Heart attack Paternal Uncle   . Stroke Paternal Grandmother     Social History   Socioeconomic History  . Marital status: Unknown    Spouse name: Not on file  . Number of children: Not on file  . Years of education: Not on file  . Highest education level: Not on file  Occupational History  . Not on file  Tobacco Use  . Smoking status: Former Smoker    Quit date: 04/11/2002    Years since quitting: 17.8  . Smokeless tobacco: Never Used  Substance and Sexual Activity  . Alcohol use: No  . Drug use: No  . Sexual activity: Not on file  Other Topics Concern  . Not on file  Social History Narrative  . Not on file   Social Determinants of Health   Financial Resource Strain: Not on file  Food Insecurity: Not on file  Transportation Needs: Not on file  Physical Activity: Not on file  Stress: Not on file  Social Connections: Not on file  Intimate Partner Violence: Not on file    REVIEW OF SYSTEMS: Constitutional: Weakness, fatigue ENT:  No nose bleeds Pulm: Nonproductive cough CV:  No palpitations, no LE edema.  No angina GU:  No hematuria, no frequency GI: See HPI Heme: Denies unusual or excessive bleeding or bruising Transfusions: None Neuro:  No headaches, no peripheral tingling  or numbness.  No syncope, no seizures Derm:  No itching, no rash or sores.  Endocrine:  No sweats or chills.  No polyuria or dysuria Immunization: Not vaccinated for COVID-19 Travel:  None beyond local counties in last few months.    PHYSICAL EXAM: Vital signs in last 24 hours: Vitals:   02/09/20 0615 02/09/20 0645  BP: (!) 119/95 (!) 95/46  Pulse: 60 (!) 57  Resp: (!) 25 (!) 22  Temp:    SpO2: 91% 93%   Wt Readings from  Last 3 Encounters:  01/24/2020 89.4 kg  03/18/12 88 kg    General: Patient does not look acutely ill.  She is comfortable and able to provide history Head: No facial asymmetry or swelling.  No signs of head trauma. Eyes: No scleral icterus, no conjunctival pallor.  EOMI Ears: Not hard of hearing Nose: No congestion or discharge Mouth: Very few teeth remain.  Mucosa is pink, moist, clear.  Tongue midline Neck: JVD, no masses, no thyromegaly.   Lungs: Clear bil,  No labored breathing, no cough Heart: RRR.  No MRG.  S1, S2 present Abdomen: Tender, not distended.  Slightly obese.  Long well-healed midline surgical scar consistent with her aorto bypass.   Rectal: Not performed Musc/Skeltl: No joint redness, swelling or gross deformity.  Some resolving minimal erythema in the medial right knee (she fell onto this knee around Thanksgiving) Extremities: No CCE Neurologic: Oriented x3.  No tremors, no gross weakness or deficits Skin: Punctate erythematous spots on the cheeks.  No sores, no rashes, no ulcers Tattoos: None Nodes: No cervical adenopathy Psych: Calm, cooperative, fluid speech.  Intake/Output from previous day: No intake/output data recorded. Intake/Output this shift: No intake/output data recorded.  LAB RESULTS: Recent Labs    01/14/2020 1153 02/09/20 0232  WBC 5.7 4.4  HGB 15.7* 13.2  HCT 45.4 40.0  PLT 196 151   BMET Lab Results  Component Value Date   NA 130 (L) 02/09/2020   NA 130 (L) 01/21/2020   K 4.1 02/09/2020   K 3.9 02/05/2020    CL 101 02/09/2020   CL 96 (L) 02/06/2020   CO2 17 (L) 02/09/2020   CO2 21 (L) 01/27/2020   GLUCOSE 177 (H) 02/09/2020   GLUCOSE 172 (H) 01/31/2020   BUN 22 02/09/2020   BUN 20 01/13/2020   CREATININE 0.95 02/09/2020   CREATININE 1.20 (H) 02/02/2020   CALCIUM 7.6 (L) 02/09/2020   CALCIUM 8.2 (L) 01/12/2020   LFT Recent Labs    01/12/2020 2114 02/09/20 0232  PROT 5.6* 5.4*  ALBUMIN 2.7* 2.8*  AST 77* 59*  ALT 67* 60*  ALKPHOS 42 41  BILITOT 0.5 0.7  BILIDIR 0.2  --   IBILI 0.3  --    PT/INR No results found for: INR, PROTIME Hepatitis Panel No results for input(s): HEPBSAG, HCVAB, HEPAIGM, HEPBIGM in the last 72 hours. C-Diff No components found for: CDIFF Lipase     Component Value Date/Time   LIPASE 29 01/30/2020 1153    Drugs of Abuse  No results found for: LABOPIA, COCAINSCRNUR, LABBENZ, AMPHETMU, THCU, LABBARB   RADIOLOGY STUDIES: CT ANGIO CHEST PE W OR WO CONTRAST  Result Date: 02/09/2020 CLINICAL DATA:  COVID positive shortness of breath EXAM: CT ANGIOGRAPHY CHEST WITH CONTRAST TECHNIQUE: Multidetector CT imaging of the chest was performed using the standard protocol during bolus administration of intravenous contrast. Multiplanar CT image reconstructions and MIPs were obtained to evaluate the vascular anatomy. CONTRAST:  43m OMNIPAQUE IOHEXOL 350 MG/ML SOLN COMPARISON:  None. FINDINGS: Cardiovascular: There is a optimal opacification of the pulmonary arteries. There is no central,segmental, or subsegmental filling defects within the pulmonary arteries. The heart is normal in size. No pericardial effusion or thickening. No evidence right heart strain. There is normal three-vessel brachiocephalic anatomy without proximal stenosis. Scattered aortic atherosclerosis is seen. Coronary artery calcifications are present. Mediastinum/Nodes: No hilar, mediastinal, or axillary adenopathy. Thyroid gland, trachea, and esophagus demonstrate no significant findings.  Lungs/Pleura: Multifocal patchy airspace opacities are seen predominantly within the periphery  of both lungs. This is most notable at both lung bases. No pleural effusion or pneumothorax. Upper Abdomen: No acute abnormalities present in the visualized portions of the upper abdomen. Musculoskeletal: No chest wall abnormality. No acute or significant osseous findings. Review of the MIP images confirms the above findings. IMPRESSION: No central, segmental, or subsegmental pulmonary embolism. Multifocal patchy airspace opacities throughout both lungs, predominantly at both lung bases consistent with atypical viral pneumonia. Electronically Signed   By: Prudencio Pair M.D.   On: 02/09/2020 00:38   DG Chest Portable 1 View  Result Date: 01/16/2020 CLINICAL DATA:  Cough, congestion, nausea, vomiting, fever and diarrhea for 4 weeks. The patient tested positive for COVID-19 2 days ago. EXAM: PORTABLE CHEST 1 VIEW COMPARISON:  None. FINDINGS: The lungs are clear. Heart size is normal. Aortic atherosclerosis. No pneumothorax or pleural fluid. No acute or focal bony abnormality. IMPRESSION: No acute disease. Aortic Atherosclerosis (ICD10-I70.0). Electronically Signed   By: Inge Rise M.D.   On: 01/15/2020 12:20     IMPRESSION:   *  Melenic stool, nausea, dry heaves,anorexia x 5 d Pt taking high dose Ibuprofen in recent days Protonix drip in place  *   Covid 19 + PNA.   Remdesivir, Solu-Medrol in place   PLAN:     *   EGD, timing per Dr. Lyndel Safe  *   Continue Protonix drip.  *    Follow hemoglobins  *    Clear liquid diet.   Azucena Freed  02/09/2020, 8:39 AM Phone [phone]   Attending physician's note   I have reviewed the chart and didn't examine the patient. I agree with the Advanced Practitioner's note, impression and recommendations.   COVID-19 PNA with SIRS and respiratory failure on O2/remdesivir/Solu-Medrol Melena with stable Hb 13.  Patient on nonsteroidals.  No active  bleeding currently. A. fib with RVR  Plan: -IV Protonix. -Trend CBC. -EGD only if there is any active bleeding.  Otherwise, recommend as an outpatient. -Stop all nonsteroidals. -Note that she needs screening colon as outpt as well.   Carmell Austria, MD [facility] [phone]

## 2020-02-09 NOTE — ED Notes (Signed)
Notified provider r/t BP and HR trending down on diltiazem gtt 107/51 and HR of 64. Order given to stop diltiazem gtt.

## 2020-02-09 NOTE — Progress Notes (Signed)
  Echocardiogram 2D Echocardiogram has been performed.  Augustine Radar 02/09/2020, 1:57 PM

## 2020-02-09 NOTE — ED Notes (Signed)
First contact. Change of shift. Pt resting in bed. NAdn

## 2020-02-09 NOTE — Progress Notes (Addendum)
PROGRESS NOTE    Diane Mendoza  [id number] DOB: [birth year] DOA: 01/29/2020 PCP: Pcp, No   Brief Narrative:   HPI: Diane Mendoza is a 74 y.o. female with medical history significant of hypertension, hyperlipidemia, PVD, GERD, history of cervical cancer status presenting the ED with complaints of generalized weakness after testing positive for COVID 3 days ago.  Patient states she has been feeling ill for the past 4 weeks.  Her symptoms include fevers, chills, cough, shortness of breath, nausea, vomiting, and diarrhea.  Also having dark stools for the past 4 weeks.  She does not take any blood thinners at home.  Patient states she was previously on aspirin but has not taken it for the past 4 years.  States she went to get tested for Covid at [place] 3 days ago and the test came back positive.  Also states she had another Covid test done at home 2 days prior to going to [place] and that was also positive.  Denies chest pain or palpitations.  She has not been vaccinated against Covid.  States she has other family members who are feeling ill at present and her son recently tested positive for Covid.  States she had a colonoscopy done over 10 years ago and was told it was normal.  She does not think she has ever had an endoscopy done   ED Course: Febrile with temperature 101.3 F.  Found to be in new onset A. fib with rate up to 130s. SPO2 88% on room air, improved with 2 L supplemental oxygen.  Tachypneic with respiratory rate up to 30s.  Not hypotensive.  WBC 5.7, hemoglobin 15.7, hematocrit 45.4, platelet 196K.  Sodium 130, potassium 3.9, chloride 96, bicarb 21, anion gap 13, BUN 20, creatinine 1.2, glucose 172.  Lipase normal.  FOBT positive.   Assessment & Plan:   Principal Problem:   COVID-19 virus infection Active Problems:   Sepsis ([place])   Acute hypoxemic respiratory failure (HCC)   GI bleed   Atrial fibrillation with rapid ventricular response (HCC)   Sepsis and acute hypoxemic respiratory  failure secondary to suspected COVID-19 viral pneumonia: Meets sepsis criteria - 2 SIRS (fever, tachycardia, tachypnea) and has a source of infection. Rapid Covid antigen test done in the ED negative, however, patient had a positive SARS-CoV-2 PCR test on 02/05/2020 at [facility] (result under Care Everywhere) and then PCR was positive here as well..  In addition, she also reports having a positive Covid test at home 2 days prior to her [place] visit.  Chest x-ray not suggestive of pneumonia, however, suspicion for COVID-19 viral pneumonia remains high given hypoxemia.  She is currently requiring 2 L of oxygen.  Continue remdesivir and Solu-Medrol.  No indication of baricitinib yet. -Check inflammatory markers including ferritin, fibrinogen, D-dimer, CRP, LDH D-dimer elevated, CT angiogram negative for PE. -Vitamin C, zinc, vitamin D -Antitussives as needed -Tylenol as needed for fevers -Bronchodilator as needed -Daily CBC with differential, CMP, CRP, D-dimer -Airborne and contact precautions -Incentive spirometry, flutter valve -Encourage prone positioning -Continuous pulse ox -Supplemental oxygen as needed to keep oxygen saturation above 90% -Blood culture x2 ordered  Melena/ active GI bleed: Patient reports having melena for the past 4 weeks.  FOBT positive, however, hemoglobin stable at 15.7 upon presentation which dropped to over 13 now.  Aspirin is listed in home medications but patient states she has not taken it for the past 4 years.  GI consulted.  Plan for EGD at some point in time.  Continue Protonix drip.  Monitor H&H every 12 hours.  Transfuse as needed.  New onset A. fib with RVR: No documented history of A. fib and found to be in A. fib with rate up to 130s on arrival to the ED.  She was started on Cardizem infusion.  She has now converted to sinus rhythm and she is on medium dose of Cardizem.  Plan is to wean her down and monitor without any rate control medications. CHA2DS2VASc  4 -admitting hospitalist had a discussion with the patient and decision has been made to hold off starting anticoagulation given positive Hemoccult testing and concern for active GI bleed.  Echocardiogram ordered.  TSH normal.  COVID-19 viral gastroenteritis: Patient is complaining of nausea, vomiting, and diarrhea.  Abdominal exam benign.  Lipase normal. -Check LFTs, antiemetic as needed, gentle IV fluid hydration  Mild hyponatremia: Likely due to poor oral intake, vomiting, and diarrhea in the setting of acute viral illness. Stable.  Continue gentle IV hydration.  Mild normal anion gap metabolic acidosis: Likely due to diarrhea.  Bicarb slightly down.  We will switch fluids to bicarb infusion.  Mild AKI: Resolved.  Function  Hypertension: She was hypotensive earlier but now blood pressure is within normal range.  Continue to hold antihypertensives for now.  Hyperlipidemia -Resume home statin after pharmacy med rec is done.    DVT prophylaxis: SCDs Start: 02/04/2020 2016   Code Status: Full Code  Family Communication: None present at bedside.  Tried calling patient's son Diane Mendoza but no answer.  Left voicemail. Addendum: Son returned phone call.  Updated him.  Answered questions.  Status is: Inpatient  Remains inpatient appropriate because:Ongoing diagnostic testing needed not appropriate for outpatient work up   Dispo: The patient is from: Home              Anticipated d/c is to: Home              Anticipated d/c date is: 2 days              Patient currently is not medically stable to d/c.        Estimated body mass index is 32.78 kg/m as calculated from the following:   Height as of this encounter: 5\' 5"  (1.651 m).   Weight as of this encounter: 89.4 kg.      Nutritional status:               Consultants:   GI  Procedures:   None  Antimicrobials:  Anti-infectives (From admission, onward)   Start     Dose/Rate Route Frequency Ordered Stop    02/09/20 1000  remdesivir 100 mg in sodium chloride 0.9 % 100 mL IVPB       "Followed by" Linked Group Details   100 mg 200 mL/hr over 30 Minutes Intravenous Daily 01/25/2020 2020 02/13/20 0959   01/15/2020 2030  remdesivir 200 mg in sodium chloride 0.9% 250 mL IVPB       "Followed by" Linked Group Details   200 mg 580 mL/hr over 30 Minutes Intravenous Once 01/21/2020 2020           Subjective: Patient seen and examined.  She states that she feels better.  No shortness of breath or any other complaint.  Objective: Vitals:   02/09/20 0700 02/09/20 0800 02/09/20 0815 02/09/20 0915  BP: (!) 102/53 (!) 142/50 (!) 130/57 (!) 114/28  Pulse: (!) 58 70 69 78  Resp: (!) 21 (!) 30 (!) 28 19  Temp:      TempSrc:      SpO2: 92% (!) 89% 90% (!) 89%  Weight:      Height:       No intake or output data in the 24 hours ending 02/09/20 1016 Filed Weights   01/13/2020 2139  Weight: 89.4 kg    Examination:  General exam: Appears calm and comfortable  Respiratory system: Clear to auscultation. Respiratory effort normal. Cardiovascular system: S1 & S2 heard, RRR. No JVD, murmurs, rubs, gallops or clicks. No pedal edema. Gastrointestinal system: Abdomen is nondistended, soft and nontender. No organomegaly or masses felt. Normal bowel sounds heard. Central nervous system: Alert and oriented. No focal neurological deficits. Extremities: Symmetric 5 x 5 power. Skin: No rashes, lesions or ulcers Psychiatry: Judgement and insight appear normal. Mood & affect appropriate.    Data Reviewed: I have personally reviewed following labs and imaging studies  CBC: Recent Labs  Lab 01/18/2020 1153 02/09/20 0232  WBC 5.7 4.4  NEUTROABS  --  3.8  HGB 15.7* 13.2  HCT 45.4 40.0  MCV 88.7 92.0  PLT 196 [id number]   Basic Metabolic Panel: Recent Labs  Lab 01/12/2020 1153 02/09/20 0232  NA 130* 130*  K 3.9 4.1  CL 96* 101  CO2 21* 17*  GLUCOSE 172* 177*  BUN 20 22  CREATININE 1.20* 0.95  CALCIUM 8.2* 7.6*    GFR: Estimated Creatinine Clearance: 57.4 mL/min (by C-G formula based on SCr of 0.95 mg/dL). Liver Function Tests: Recent Labs  Lab 01/15/2020 2114 02/09/20 0232  AST 77* 59*  ALT 67* 60*  ALKPHOS 42 41  BILITOT 0.5 0.7  PROT 5.6* 5.4*  ALBUMIN 2.7* 2.8*   Recent Labs  Lab 02/05/2020 1153  LIPASE 29   No results for input(s): AMMONIA in the last 168 hours. Coagulation Profile: No results for input(s): INR, PROTIME in the last 168 hours. Cardiac Enzymes: No results for input(s): CKTOTAL, CKMB, CKMBINDEX, TROPONINI in the last 168 hours. BNP (last 3 results) No results for input(s): PROBNP in the last 8760 hours. HbA1C: No results for input(s): HGBA1C in the last 72 hours. CBG: Recent Labs  Lab 01/20/2020 1148  GLUCAP 190*   Lipid Profile: No results for input(s): CHOL, HDL, LDLCALC, TRIG, CHOLHDL, LDLDIRECT in the last 72 hours. Thyroid Function Tests: Recent Labs    01/28/2020 2114  TSH 1.246   Anemia Panel: Recent Labs    01/13/2020 2114  FERRITIN 2,256*   Sepsis Labs: Recent Labs  Lab 02/06/2020 2114  PROCALCITON 18.67  LATICACIDVEN 1.5    Recent Results (from the past 240 hour(s))  Resp Panel by RT-PCR (Flu A&B, Covid) Nasopharyngeal Swab     Status: Abnormal   Collection Time: 01/15/2020  6:28 PM   Specimen: Nasopharyngeal Swab; Nasopharyngeal(NP) swabs in vial transport medium  Result Value Ref Range Status   SARS Coronavirus 2 by RT PCR POSITIVE (A) NEGATIVE Final    Comment: RESULT CALLED TO, READ BACK BY AND VERIFIED WITH: Rick Duff RN 02/07/2020 AT 2213 SK  (NOTE) SARS-CoV-2 target nucleic acids are DETECTED.  The SARS-CoV-2 RNA is generally detectable in upper respiratory specimens during the acute phase of infection. Positive results are indicative of the presence of the identified virus, but do not rule out bacterial infection or co-infection with other pathogens not detected by the test. Clinical correlation with patient history and other  diagnostic information is necessary to determine patient infection status. The expected result is Negative.  Fact Sheet for Patients:  BloggerCourse.com  Fact Sheet for Healthcare Providers: SeriousBroker.it  This test is not yet approved or cleared by the [place] FDA and  has been authorized for detection and/or diagnosis of SARS-CoV-2 by FDA under an Emergency Use Authorization (EUA).  This EUA will remain in effect (meaning this test can be  used) for the duration of  the COVID-19 declaration under Section 564(b)(1) of the Act, 21 U.S.C. section 360bbb-3(b)(1), unless the authorization is terminated or revoked sooner.     Influenza A by PCR NEGATIVE NEGATIVE Final   Influenza B by PCR NEGATIVE NEGATIVE Final    Comment: (NOTE) The Xpert Xpress SARS-CoV-2/FLU/RSV plus assay is intended as an aid in the diagnosis of influenza from Nasopharyngeal swab specimens and should not be used as a sole basis for treatment. Nasal washings and aspirates are unacceptable for Xpert Xpress SARS-CoV-2/FLU/RSV testing.  Fact Sheet for Patients: BloggerCourse.com  Fact Sheet for Healthcare Providers: SeriousBroker.it  This test is not yet approved or cleared by the [place] FDA and has been authorized for detection and/or diagnosis of SARS-CoV-2 by FDA under an Emergency Use Authorization (EUA). This EUA will remain in effect (meaning this test can be used) for the duration of the COVID-19 declaration under Section 564(b)(1) of the Act, 21 U.S.C. section 360bbb-3(b)(1), unless the authorization is terminated or revoked.  Performed at [facility], [address] [place], [place] [address]   Culture, blood (Routine X 2) w Reflex to ID Panel     Status: None (Preliminary result)   Collection Time: 02/07/2020  9:14 PM   Specimen: BLOOD LEFT ARM  Result Value Ref Range Status    Specimen Description BLOOD LEFT ARM  Final   Special Requests   Final    BOTTLES DRAWN AEROBIC AND ANAEROBIC Blood Culture adequate volume   Culture   Final    NO GROWTH < 12 HOURS Performed at [facility], [address], [place], [place] [address]    Report Status PENDING  Incomplete      Radiology Studies: CT ANGIO CHEST PE W OR WO CONTRAST  Result Date: 02/09/2020 CLINICAL DATA:  COVID positive shortness of breath EXAM: CT ANGIOGRAPHY CHEST WITH CONTRAST TECHNIQUE: Multidetector CT imaging of the chest was performed using the standard protocol during bolus administration of intravenous contrast. Multiplanar CT image reconstructions and MIPs were obtained to evaluate the vascular anatomy. CONTRAST:  35mL OMNIPAQUE IOHEXOL 350 MG/ML SOLN COMPARISON:  None. FINDINGS: Cardiovascular: There is a optimal opacification of the pulmonary arteries. There is no central,segmental, or subsegmental filling defects within the pulmonary arteries. The heart is normal in size. No pericardial effusion or thickening. No evidence right heart strain. There is normal three-vessel brachiocephalic anatomy without proximal stenosis. Scattered aortic atherosclerosis is seen. Coronary artery calcifications are present. Mediastinum/Nodes: No hilar, mediastinal, or axillary adenopathy. Thyroid gland, trachea, and esophagus demonstrate no significant findings. Lungs/Pleura: Multifocal patchy airspace opacities are seen predominantly within the periphery of both lungs. This is most notable at both lung bases. No pleural effusion or pneumothorax. Upper Abdomen: No acute abnormalities present in the visualized portions of the upper abdomen. Musculoskeletal: No chest wall abnormality. No acute or significant osseous findings. Review of the MIP images confirms the above findings. IMPRESSION: No central, segmental, or subsegmental pulmonary embolism. Multifocal patchy airspace opacities throughout both lungs, predominantly at  both lung bases consistent with atypical viral pneumonia. Electronically Signed   By: Jonna Clark M.D.   On: 02/09/2020 00:38  DG Chest Portable 1 View  Result Date: 01/20/2020 CLINICAL DATA:  Cough, congestion, nausea, vomiting, fever and diarrhea for 4 weeks. The patient tested positive for COVID-19 2 days ago. EXAM: PORTABLE CHEST 1 VIEW COMPARISON:  None. FINDINGS: The lungs are clear. Heart size is normal. Aortic atherosclerosis. No pneumothorax or pleural fluid. No acute or focal bony abnormality. IMPRESSION: No acute disease. Aortic Atherosclerosis (ICD10-I70.0). Electronically Signed   By: Drusilla Kanner M.D.   On: 02/07/2020 12:20    Scheduled Meds: . vitamin C  500 mg Oral Daily  . cholecalciferol  1,000 Units Oral Daily  . methylPREDNISolone (SOLU-MEDROL) injection  45 mg Intravenous Q12H   Followed by  . [START ON 02/11/2020] predniSONE  50 mg Oral Daily  . [START ON 03/06/2020] pantoprazole  40 mg Intravenous Q12H  . zinc sulfate  220 mg Oral Daily   Continuous Infusions: . diltiazem (CARDIZEM) infusion Stopped (01/17/2020 1845)  . diltiazem (CARDIZEM) infusion 7.5 mg/hr (02/09/20 0659)  . pantoprozole (PROTONIX) infusion 8 mg/hr (01/23/2020 2345)  . remdesivir 200 mg in sodium chloride 0.9% 250 mL IVPB Stopped (02/07/2020 2121)   Followed by  . remdesivir 100 mg in NS 100 mL 100 mg (02/09/20 0953)     LOS: 1 day   Time spent: 37 minutes   Hughie Closs, MD [facility]  02/09/2020, 10:16 AM   To contact the attending provider between 7A-7P or the covering provider during after hours 7P-7A, please log into the web site [email or web address].

## 2020-02-09 NOTE — ED Notes (Signed)
Notified provider r/t increasing O2 demand. Pt currently on 6L sating 87-89%

## 2020-02-09 NOTE — ED Notes (Signed)
Got patient up to the bedside toilet patient has call bell in reach 

## 2020-02-10 ENCOUNTER — Encounter (HOSPITAL_COMMUNITY): Admission: EM | Disposition: E | Payer: Self-pay | Source: Home / Self Care | Attending: Internal Medicine

## 2020-02-10 DIAGNOSIS — K922 Gastrointestinal hemorrhage, unspecified: Secondary | ICD-10-CM | POA: Diagnosis not present

## 2020-02-10 DIAGNOSIS — I4891 Unspecified atrial fibrillation: Secondary | ICD-10-CM

## 2020-02-10 DIAGNOSIS — U071 COVID-19: Secondary | ICD-10-CM | POA: Diagnosis not present

## 2020-02-10 LAB — COMPREHENSIVE METABOLIC PANEL
ALT: 43 U/L (ref 0–44)
AST: 37 U/L (ref 15–41)
Albumin: 2.6 g/dL — ABNORMAL LOW (ref 3.5–5.0)
Alkaline Phosphatase: 45 U/L (ref 38–126)
Anion gap: 12 (ref 5–15)
BUN: 16 mg/dL (ref 8–23)
CO2: 23 mmol/L (ref 22–32)
Calcium: 7.9 mg/dL — ABNORMAL LOW (ref 8.9–10.3)
Chloride: 101 mmol/L (ref 98–111)
Creatinine, Ser: 0.73 mg/dL (ref 0.44–1.00)
GFR, Estimated: >60 mL/min (ref >60)
Glucose, Bld: 177 mg/dL — ABNORMAL HIGH (ref 70–99)
Potassium: 3.7 mmol/L (ref 3.5–5.1)
Sodium: 136 mmol/L (ref 135–145)
Total Bilirubin: 0.5 mg/dL (ref 0.3–1.2)
Total Protein: 5.4 g/dL — ABNORMAL LOW (ref 6.5–8.1)

## 2020-02-10 LAB — CBC WITH DIFFERENTIAL/PLATELET
Abs Immature Granulocytes: 0.05 10*3/uL (ref 0.00–0.07)
Basophils Absolute: 0 10*3/uL (ref 0.0–0.1)
Basophils Relative: 0 %
Eosinophils Absolute: 0 10*3/uL (ref 0.0–0.5)
Eosinophils Relative: 0 %
HCT: 36.4 % (ref 36.0–46.0)
Hemoglobin: 12.6 g/dL (ref 12.0–15.0)
Immature Granulocytes: 1 %
Lymphocytes Relative: 8 %
Lymphs Abs: 0.5 10*3/uL — ABNORMAL LOW (ref 0.7–4.0)
MCH: 30.1 pg (ref 26.0–34.0)
MCHC: 34.6 g/dL (ref 30.0–36.0)
MCV: 86.9 fL (ref 80.0–100.0)
Monocytes Absolute: 0.4 10*3/uL (ref 0.1–1.0)
Monocytes Relative: 6 %
Neutro Abs: 5.8 10*3/uL (ref 1.7–7.7)
Neutrophils Relative %: 85 %
Platelets: 203 10*3/uL (ref 150–400)
RBC: 4.19 MIL/uL (ref 3.87–5.11)
RDW: 13.5 % (ref 11.5–15.5)
WBC: 6.8 10*3/uL (ref 4.0–10.5)
nRBC: 0 % (ref 0.0–0.2)

## 2020-02-10 LAB — D-DIMER, QUANTITATIVE: D-Dimer, Quant: 2.41 ug/mL-FEU — ABNORMAL HIGH (ref 0.00–0.50)

## 2020-02-10 LAB — URINALYSIS, ROUTINE W REFLEX MICROSCOPIC
Bilirubin Urine: NEGATIVE
Glucose, UA: NEGATIVE mg/dL
Hgb urine dipstick: NEGATIVE
Ketones, ur: 20 mg/dL — AB
Leukocytes,Ua: NEGATIVE
Nitrite: NEGATIVE
Protein, ur: 30 mg/dL — AB
Specific Gravity, Urine: 1.025 (ref 1.005–1.030)
pH: 6 (ref 5.0–8.0)

## 2020-02-10 LAB — C-REACTIVE PROTEIN: CRP: 6 mg/dL — ABNORMAL HIGH (ref <1.0)

## 2020-02-10 SURGERY — ESOPHAGOGASTRODUODENOSCOPY (EGD) WITH PROPOFOL
Anesthesia: Monitor Anesthesia Care

## 2020-02-10 MED ORDER — FENTANYL CITRATE (PF) 100 MCG/2ML IJ SOLN
12.5000 ug | INTRAMUSCULAR | Status: DC | PRN
  Administered 2020-02-10: 12.5 ug via INTRAVENOUS
  Filled 2020-02-10: qty 2

## 2020-02-10 MED ORDER — SODIUM CHLORIDE 0.9 % IV SOLN
100.0000 mg | Freq: Every day | INTRAVENOUS | Status: AC
  Administered 2020-02-11 – 2020-02-13 (×3): 100 mg via INTRAVENOUS
  Filled 2020-02-10 (×3): qty 20

## 2020-02-10 MED ORDER — MORPHINE SULFATE (PF) 2 MG/ML IV SOLN
1.0000 mg | INTRAVENOUS | Status: DC | PRN
  Filled 2020-02-10: qty 1

## 2020-02-10 MED ORDER — SODIUM CHLORIDE 0.9 % IV SOLN
2.0000 g | INTRAVENOUS | Status: AC
  Administered 2020-02-10 – 2020-02-14 (×5): 2 g via INTRAVENOUS
  Filled 2020-02-10 (×5): qty 20

## 2020-02-10 MED ORDER — PROMETHAZINE HCL 25 MG/ML IJ SOLN
6.2500 mg | Freq: Four times a day (QID) | INTRAMUSCULAR | Status: DC | PRN
  Administered 2020-02-10 – 2020-02-15 (×4): 6.25 mg via INTRAVENOUS
  Filled 2020-02-10 (×5): qty 1

## 2020-02-10 MED ORDER — METOPROLOL TARTRATE 12.5 MG HALF TABLET
12.5000 mg | ORAL_TABLET | Freq: Two times a day (BID) | ORAL | Status: DC
Start: 2020-02-10 — End: 2020-02-12
  Administered 2020-02-10 – 2020-02-11 (×3): 12.5 mg via ORAL
  Filled 2020-02-10 (×5): qty 1

## 2020-02-10 MED ORDER — SODIUM CHLORIDE 0.9 % IV SOLN
500.0000 mg | INTRAVENOUS | Status: AC
  Administered 2020-02-10 – 2020-02-14 (×5): 500 mg via INTRAVENOUS
  Filled 2020-02-10 (×5): qty 500

## 2020-02-10 NOTE — Progress Notes (Addendum)
Patient given diet for today. Plan is for EGD tomorrow to evaluate recent melena. Patient has new onset Afib with RVR and she is going to need Eliquis. Currently on PPI infusion. Will need outpatient colonoscopy at later date ( after resolution of COVID19).   Hgb 12.6, down from 15.7 on admission two days ago.      Addendum: Due to need for anticoagulation, would proceed with EGD tomorrow with Covid precautions. RG

## 2020-02-10 NOTE — Progress Notes (Signed)
Pt complained of pain and nausea.  It was too early for zofran so MD paged and an order for morphine and phenergran received.  This RN attempted to administer med and patient stated that "I cannot take morphine because it makes me crazy".  RN asked patient to explain what she meant and patient stated "It makes me really mean ; when I took it last time, I was throwing things at people and started to curse everyone that came near me".  MD was notified and an order for fentanyl received.  She is in bed resting.  Will continue to monitor.

## 2020-02-10 NOTE — Progress Notes (Addendum)
PROGRESS NOTE    Diane Mendoza  [id number] DOB: [birth year] DOA: 02/05/2020 PCP: Pcp, No   Brief Narrative:   HPI: Diane Mendoza is a 74 y.o. female with medical history significant of hypertension, hyperlipidemia, PVD, GERD, history of cervical cancer status presenting the ED with complaints of generalized weakness after testing positive for COVID 3 days ago.  Patient states she has been feeling ill for the past 4 weeks.  Her symptoms include fevers, chills, cough, shortness of breath, nausea, vomiting, and diarrhea.  Also having dark stools for the past 4 weeks.  She does not take any blood thinners at home.  Patient states she was previously on aspirin but has not taken it for the past 4 years.  States she went to get tested for Covid at [facility] 3 days ago and the test came back positive.  Also states she had another Covid test done at home 2 days prior to going to [facility] and that was also positive.  Denies chest pain or palpitations.  She has not been vaccinated against Covid.  States she has other family members who are feeling ill at present and her son recently tested positive for Covid.  States she had a colonoscopy done over 10 years ago and was told it was normal.  She does not think she has ever had an endoscopy done   ED Course: Febrile with temperature 101.3 F.  Found to be in new onset A. fib with rate up to 130s. SPO2 88% on room air, improved with 2 L supplemental oxygen.  Tachypneic with respiratory rate up to 30s.  Not hypotensive.  WBC 5.7, hemoglobin 15.7, hematocrit 45.4, platelet 196K.  Sodium 130, potassium 3.9, chloride 96, bicarb 21, anion gap 13, BUN 20, creatinine 1.2, glucose 172.  Lipase normal.  FOBT positive.  Subjective: Patient seen and examined.  She states that she feels better.  No shortness of breath or any other complaint.  Assessment & Plan:   Principal Problem:   COVID-19 virus infection Active Problems:   Sepsis ([facility])   Acute hypoxemic respiratory failure  (HCC)   GI bleed   Atrial fibrillation with rapid ventricular response (HCC)   Sepsis and acute hypoxemic respiratory failure secondary to suspected COVID-19 viral pneunia -She is unvaccinated -  Continue remdesivir and Solu-Medrol.  No indication of baricitinib yet(especially with elevated procalcitonin) this morning she is on 4 L nasal cannula -Check inflammatory markers including ferritin, fibrinogen, D-dimer, CRP, LDH - D-dimer elevated, CT angiogram negative for PE. -Antitussives as needed -Tylenol as needed for fevers -Bronchodilator as needed -Airborne and contact precautions -Incentive spirometry, flutter valve -Encourage prone positioning -Continuous pulse ox -Supplemental oxygen as needed to keep oxygen saturation above 90% -Blood culture x2 ordered -pro calcitonin elevated at 18, will start on Ceftin and azithromycin for presumed bacterial pneumonia, will check UA as well.  Melena/ active GI bleed: - Patient reports having melena for the past 4 weeks.  FOBT positive, however, hemoglobin stable  -  Aspirin is listed in home medications but patient states she has not taken it for the past 4 years.  GI consulted.  Plan for EGD tomorrow, as she will need to be evaluated before starting Eliquis.  continue Protonix drip.  Monitor H&H every 12 hours.  Transfuse as needed.  New onset A. fib with RVR:  - No documented history of A. fib and found to be in A. fib with rate up to 130s on arrival to the ED.   - She  was started on Cardizem infusion.  She has now converted to sinus rhythm and she is on medium dose of Cardizem.  Plan is to wean her down and monitor without any rate control medications. CHA2DS2VASc 4 -admitting hospitalist had a discussion with the patient and decision has been made to hold off starting anticoagulation given positive Hemoccult testing and concern for active GI bleed.  Echocardiogram ordered.  TSH normal.   COVID-19 viral gastroenteritis: Patient is  complaining of nausea, vomiting, and diarrhea.  Abdominal exam benign.  Lipase normal. -Check LFTs, antiemetic as needed, gentle IV fluid hydration  Mild hyponatremia: Likely due to poor oral intake, vomiting, and diarrhea in the setting of acute viral illness. Stable.  Continue gentle IV hydration.  Mild normal anion gap metabolic acidosis: Likely due to diarrhea.  Bicarb slightly down.  We will switch fluids to bicarb infusion.  Mild AKI: Resolved.  Function  Hypertension: She was hypotensive earlier but now blood pressure started to increase, with metoprolol specially in the setting of new diagnosis of A. fib .  Hyperlipidemia -Resume home statin after pharmacy med rec is done.    DVT prophylaxis: SCDs Start: 02/07/2020 2016   Code Status: Full Code  Family Communication: None present at bedside.  D/W son  12/31.  Remains inpatient appropriate because:Ongoing diagnostic testing needed not appropriate for outpatient work up   Dispo: The patient is from: Home              Anticipated d/c is to: Home              Anticipated d/c date is: 2 days              Patient currently is not medically stable to d/c.        Estimated body mass index is 32.78 kg/m as calculated from the following:   Height as of this encounter: 5\' 5"  (1.651 m).   Weight as of this encounter: 89.4 kg.              Consultants:   GI  Procedures:   None  Antimicrobials:  Anti-infectives (From admission, onward)   Start     Dose/Rate Route Frequency Ordered Stop   02/11/20 1000  remdesivir 100 mg in sodium chloride 0.9 % 100 mL IVPB        100 mg 200 mL/hr over 30 Minutes Intravenous Daily 02/01/2020 1426 02/14/20 0959   02/09/20 1000  remdesivir 100 mg in sodium chloride 0.9 % 100 mL IVPB  Status:  Discontinued       "Followed by" Linked Group Details   100 mg 200 mL/hr over 30 Minutes Intravenous Daily 01/31/2020 2020 02/09/2020 1426   01/30/2020 2030  remdesivir 200 mg in sodium  chloride 0.9% 250 mL IVPB  Status:  Discontinued       "Followed by" Linked Group Details   200 mg 580 mL/hr over 30 Minutes Intravenous Once 01/19/2020 2020 02/05/2020 1426           Objective: Vitals:   01/14/2020 0001 01/23/2020 0406 01/24/2020 0800 01/16/2020 1200  BP: 139/65 (!) 154/62 105/60 (!) 151/64  Pulse: 86 97 74   Resp: 20 18  (!) 24  Temp: 98.6 F (37 C) 98.3 F (36.8 C) 98.5 F (36.9 C) 98.4 F (36.9 C)  TempSrc: Oral Oral Axillary Oral  SpO2: 92% (!) 87% (!) 89%   Weight:      Height:        Intake/Output Summary (Last  24 hours) at 01/15/2020 1521 Last data filed at 01/15/2020 0456 Gross per 24 hour  Intake 1259.84 ml  Output --  Net 1259.84 ml   Filed Weights   01/25/2020 2139  Weight: 89.4 kg    Examination:  Awake Alert, Oriented X 3,frail, No new F.N deficits, Normal affect Symmetrical Chest wall movement, Good air movement bilaterally, CTAB RRR,No Gallops,Rubs or new Murmurs, No Parasternal Heave +ve B.Sounds, Abd Soft, No tenderness, No rebound - guarding or rigidity. No Cyanosis, Clubbing or edema, No new Rash or bruise      Data Reviewed: I have personally reviewed following labs and imaging studies  CBC: Recent Labs  Lab 02/07/2020 1153 02/09/20 0232 02/06/2020 0342  WBC 5.7 4.4 6.8  NEUTROABS  --  3.8 5.8  HGB 15.7* 13.2 12.6  HCT 45.4 40.0 36.4  MCV 88.7 92.0 86.9  PLT 196 151 [id number]   Basic Metabolic Panel: Recent Labs  Lab 01/27/2020 1153 02/09/20 0232 01/23/2020 0342  NA 130* 130* 136  K 3.9 4.1 3.7  CL 96* 101 101  CO2 21* 17* 23  GLUCOSE 172* 177* 177*  BUN 20 22 16   CREATININE 1.20* 0.95 0.73  CALCIUM 8.2* 7.6* 7.9*   GFR: Estimated Creatinine Clearance: 68.2 mL/min (by C-G formula based on SCr of 0.73 mg/dL). Liver Function Tests: Recent Labs  Lab 02/07/2020 2114 02/09/20 0232 01/24/2020 0342  AST 77* 59* 37  ALT 67* 60* 43  ALKPHOS 42 41 45  BILITOT 0.5 0.7 0.5  PROT 5.6* 5.4* 5.4*  ALBUMIN 2.7* 2.8* 2.6*   Recent  Labs  Lab 01/22/2020 1153  LIPASE 29   No results for input(s): AMMONIA in the last 168 hours. Coagulation Profile: No results for input(s): INR, PROTIME in the last 168 hours. Cardiac Enzymes: No results for input(s): CKTOTAL, CKMB, CKMBINDEX, TROPONINI in the last 168 hours. BNP (last 3 results) No results for input(s): PROBNP in the last 8760 hours. HbA1C: No results for input(s): HGBA1C in the last 72 hours. CBG: Recent Labs  Lab 02/05/2020 1148  GLUCAP 190*   Lipid Profile: No results for input(s): CHOL, HDL, LDLCALC, TRIG, CHOLHDL, LDLDIRECT in the last 72 hours. Thyroid Function Tests: Recent Labs    01/14/2020 2114  TSH 1.246   Anemia Panel: Recent Labs    01/24/2020 2114  FERRITIN 2,256*   Sepsis Labs: Recent Labs  Lab 01/12/2020 2114  PROCALCITON 18.67  LATICACIDVEN 1.5    Recent Results (from the past 240 hour(s))  Resp Panel by RT-PCR (Flu A&B, Covid) Nasopharyngeal Swab     Status: Abnormal   Collection Time: 02/09/2020  6:28 PM   Specimen: Nasopharyngeal Swab; Nasopharyngeal(NP) swabs in vial transport medium  Result Value Ref Range Status   SARS Coronavirus 2 by RT PCR POSITIVE (A) NEGATIVE Final    Comment: RESULT CALLED TO, READ BACK BY AND VERIFIED WITH: Rick Duff RN 01/19/2020 AT 2213 SK  (NOTE) SARS-CoV-2 target nucleic acids are DETECTED.  The SARS-CoV-2 RNA is generally detectable in upper respiratory specimens during the acute phase of infection. Positive results are indicative of the presence of the identified virus, but do not rule out bacterial infection or co-infection with other pathogens not detected by the test. Clinical correlation with patient history and other diagnostic information is necessary to determine patient infection status. The expected result is Negative.  Fact Sheet for Patients: EntrepreneurPulse.com.au  Fact Sheet for Healthcare Providers: IncredibleEmployment.be  This test is not yet  approved or cleared by the [place]  States FDA and  has been authorized for detection and/or diagnosis of SARS-CoV-2 by FDA under an Emergency Use Authorization (EUA).  This EUA will remain in effect (meaning this test can be  used) for the duration of  the COVID-19 declaration under Section 564(b)(1) of the Act, 21 U.S.C. section 360bbb-3(b)(1), unless the authorization is terminated or revoked sooner.     Influenza A by PCR NEGATIVE NEGATIVE Final   Influenza B by PCR NEGATIVE NEGATIVE Final    Comment: (NOTE) The Xpert Xpress SARS-CoV-2/FLU/RSV plus assay is intended as an aid in the diagnosis of influenza from Nasopharyngeal swab specimens and should not be used as a sole basis for treatment. Nasal washings and aspirates are unacceptable for Xpert Xpress SARS-CoV-2/FLU/RSV testing.  Fact Sheet for Patients: EntrepreneurPulse.com.au  Fact Sheet for Healthcare Providers: IncredibleEmployment.be  This test is not yet approved or cleared by the [place] FDA and has been authorized for detection and/or diagnosis of SARS-CoV-2 by FDA under an Emergency Use Authorization (EUA). This EUA will remain in effect (meaning this test can be used) for the duration of the COVID-19 declaration under Section 564(b)(1) of the Act, 21 U.S.C. section 360bbb-3(b)(1), unless the authorization is terminated or revoked.  Performed at [facility], [place] [address] [place], [place] [address]   Culture, blood (Routine X 2) w Reflex to ID Panel     Status: None (Preliminary result)   Collection Time: 01/30/2020  9:14 PM   Specimen: BLOOD LEFT ARM  Result Value Ref Range Status   Specimen Description BLOOD LEFT ARM  Final   Special Requests   Final    BOTTLES DRAWN AEROBIC AND ANAEROBIC Blood Culture adequate volume   Culture   Final    NO GROWTH 2 DAYS Performed at [facility], [place] [address], [place], [place] [address]    Report Status PENDING   Incomplete  Culture, blood (Routine X 2) w Reflex to ID Panel     Status: None (Preliminary result)   Collection Time: 02/09/20  9:07 AM   Specimen: Site Not Specified; Blood  Result Value Ref Range Status   Specimen Description SITE NOT SPECIFIED  Final   Special Requests   Final    BOTTLES DRAWN AEROBIC AND ANAEROBIC Blood Culture results may not be optimal due to an excessive volume of blood received in culture bottles   Culture   Final    NO GROWTH < 24 HOURS Performed at [facility] [address], [place], [place] [address]    Report Status PENDING  Incomplete      Radiology Studies: CT ANGIO CHEST PE W OR WO CONTRAST  Result Date: 02/09/2020 CLINICAL DATA:  COVID positive shortness of breath EXAM: CT ANGIOGRAPHY CHEST WITH CONTRAST TECHNIQUE: Multidetector CT imaging of the chest was performed using the standard protocol during bolus administration of intravenous contrast. Multiplanar CT image reconstructions and MIPs were obtained to evaluate the vascular anatomy. CONTRAST:  32mL OMNIPAQUE IOHEXOL 350 MG/ML SOLN COMPARISON:  None. FINDINGS: Cardiovascular: There is a optimal opacification of the pulmonary arteries. There is no central,segmental, or subsegmental filling defects within the pulmonary arteries. The heart is normal in size. No pericardial effusion or thickening. No evidence right heart strain. There is normal three-vessel brachiocephalic anatomy without proximal stenosis. Scattered aortic atherosclerosis is seen. Coronary artery calcifications are present. Mediastinum/Nodes: No hilar, mediastinal, or axillary adenopathy. Thyroid gland, trachea, and esophagus demonstrate no significant findings. Lungs/Pleura: Multifocal patchy airspace opacities are seen  predominantly within the periphery of both lungs. This is most notable at both lung bases. No pleural effusion or pneumothorax. Upper Abdomen: No acute abnormalities present in the visualized portions of the upper  abdomen. Musculoskeletal: No chest wall abnormality. No acute or significant osseous findings. Review of the MIP images confirms the above findings. IMPRESSION: No central, segmental, or subsegmental pulmonary embolism. Multifocal patchy airspace opacities throughout both lungs, predominantly at both lung bases consistent with atypical viral pneumonia. Electronically Signed   By: Jonna Clark M.D.   On: 02/09/2020 00:38   ECHOCARDIOGRAM COMPLETE  Result Date: 02/09/2020    ECHOCARDIOGRAM REPORT   Patient Name:   TRISTYNN COUPAL Date of Exam: 02/09/2020 Medical Rec #:  [id number]  Height:       65.0 in Accession #:    [id number] Weight:       197.0 lb Date of Birth:  1945/04/23   BSA:          1.965 m Patient Age:    74 years   BP:           95/46 mmHg Patient Gender: F          HR:           74 bpm. Exam Location:  Inpatient Procedure: 2D Echo, Color Doppler and Cardiac Doppler Indications:    I48.91* Unspeicified atrial fibrillation  History:        Patient has no prior history of Echocardiogram examinations.                 Risk Factors:Hypertension and Dyslipidemia.  Sonographer:    Eulah Pont RDCS Referring Phys: [id number] VASUNDHRA RATHORE IMPRESSIONS  1. Left ventricular ejection fraction, by estimation, is 55 to 60%. The left ventricle has normal function. The left ventricle has no regional wall motion abnormalities. Left ventricular diastolic parameters are consistent with Grade II diastolic dysfunction (pseudonormalization).  2. Right ventricular systolic function is normal. The right ventricular size is normal. There is normal pulmonary artery systolic pressure.  3. Left atrial size was mildly dilated.  4. The mitral valve is normal in structure. No evidence of mitral valve regurgitation. No evidence of mitral stenosis.  5. The aortic valve is tricuspid. Aortic valve regurgitation is trivial. Mild to moderate aortic valve sclerosis/calcification is present, without any evidence of aortic stenosis.  6. The  inferior vena cava is dilated in size with >50% respiratory variability, suggesting right atrial pressure of 8 mmHg. FINDINGS  Left Ventricle: Left ventricular ejection fraction, by estimation, is 55 to 60%. The left ventricle has normal function. The left ventricle has no regional wall motion abnormalities. The left ventricular internal cavity size was normal in size. There is  no left ventricular hypertrophy. Left ventricular diastolic parameters are consistent with Grade II diastolic dysfunction (pseudonormalization). Right Ventricle: The right ventricular size is normal. No increase in right ventricular wall thickness. Right ventricular systolic function is normal. There is normal pulmonary artery systolic pressure. The tricuspid regurgitant velocity is 2.15 m/s, and  with an assumed right atrial pressure of 8 mmHg, the estimated right ventricular systolic pressure is 26.5 mmHg. Left Atrium: Left atrial size was mildly dilated. Right Atrium: Right atrial size was normal in size. Pericardium: There is no evidence of pericardial effusion. Mitral Valve: The mitral valve is normal in structure. No evidence of mitral valve regurgitation. No evidence of mitral valve stenosis. Tricuspid Valve: The tricuspid valve is normal in structure. Tricuspid valve regurgitation is not demonstrated. No evidence of tricuspid  stenosis. Aortic Valve: The aortic valve is tricuspid. Aortic valve regurgitation is trivial. Mild to moderate aortic valve sclerosis/calcification is present, without any evidence of aortic stenosis. Pulmonic Valve: The pulmonic valve was normal in structure. Pulmonic valve regurgitation is not visualized. No evidence of pulmonic stenosis. Aorta: The aortic root is normal in size and structure. Venous: The inferior vena cava is dilated in size with greater than 50% respiratory variability, suggesting right atrial pressure of 8 mmHg. IAS/Shunts: No atrial level shunt detected by color flow Doppler.  LEFT  VENTRICLE PLAX 2D LVIDd:         4.50 cm  Diastology LVIDs:         2.60 cm  LV e' medial:    8.49 cm/s LV PW:         0.80 cm  LV E/e' medial:  13.4 LV IVS:        0.90 cm  LV e' lateral:   9.46 cm/s LVOT diam:     2.00 cm  LV E/e' lateral: 12.1 LV SV:         101 LV SV Index:   51 LVOT Area:     3.14 cm  RIGHT VENTRICLE RV S prime:     14.80 cm/s TAPSE (M-mode): 2.7 cm LEFT ATRIUM             Index       RIGHT ATRIUM           Index LA diam:        3.70 cm 1.88 cm/m  RA Area:     16.30 cm LA Vol (A2C):   57.9 ml 29.46 ml/m RA Volume:   39.70 ml  20.20 ml/m LA Vol (A4C):   75.0 ml 38.16 ml/m LA Biplane Vol: 67.6 ml 34.39 ml/m  AORTIC VALVE LVOT Vmax:   147.33 cm/s LVOT Vmean:  98.767 cm/s LVOT VTI:    0.320 m  AORTA Ao Root diam: 3.00 cm Ao Asc diam:  2.70 cm MITRAL VALVE                TRICUSPID VALVE MV Area (PHT): 2.95 cm     TR Peak grad:   18.5 mmHg MV Decel Time: 257 msec     TR Vmax:        215.00 cm/s MV E velocity: 114.00 cm/s MV A velocity: 71.70 cm/s   SHUNTS MV E/A ratio:  1.59         Systemic VTI:  0.32 m                             Systemic Diam: 2.00 cm Donato Schultz MD Electronically signed by Donato Schultz MD Signature Date/Time: 02/09/2020/2:52:28 PM    Final     Scheduled Meds: . vitamin C  500 mg Oral Daily  . atorvastatin  40 mg Oral Daily  . cholecalciferol  1,000 Units Oral Daily  . methylPREDNISolone (SOLU-MEDROL) injection  45 mg Intravenous Q12H   Followed by  . [START ON 02/11/2020] predniSONE  50 mg Oral Daily  . [START ON 02/15/2020] pantoprazole  40 mg Intravenous Q12H  . zinc sulfate  220 mg Oral Daily   Continuous Infusions: . lactated ringers 1,000 mL with sodium bicarbonate 100 mEq infusion 50 mL/hr at 02/04/2020 1129  . pantoprozole (PROTONIX) infusion 8 mg/hr (01/19/2020 0534)  . [START ON 02/11/2020] remdesivir 100 mg in NS 100 mL  LOS: 2 days     Phillips Climes, MD [facility]  01/25/2020, 3:21 PM   To contact the attending provider between  7A-7P or the covering provider during after hours 7P-7A, please log into the web site [email or web address].

## 2020-02-10 NOTE — TOC Initial Note (Signed)
Transition of Care [facility]) - Initial/Assessment Note    Patient Details  Name: Diane Mendoza MRN: [id number] Date of Birth: [birth year]  [facility] CM/SW Contact:    Lockie Pares, RN Phone Number: 01/29/2020, 9:51 AM  Clinical Narrative:                 Admitted for COVID pneumonia, not vaccinated, has other sick contacts at home. Lives with Adult relatives. In new atrial fibrillation, will likely need DOAC however patient halso having some dark stools, H&H stable. .  Will likely need home oxygen and DME, Home health. CM will follow for needs.   Expected Discharge Plan: Home w Home Health Services Barriers to Discharge: Continued Medical Work up   Patient Goals and CMS Choice        Expected Discharge Plan and Services Expected Discharge Plan: Home w Home Health Services   Discharge Planning Services: CM Consult                                          Prior Living Arrangements/Services   Lives with:: Adult Children Patient language and need for interpreter reviewed:: Yes        Need for Family Participation in Patient Care: Yes (Comment) Care giver support system in place?: Yes (comment)   Criminal Activity/Legal Involvement Pertinent to Current Situation/Hospitalization: No - Comment as needed  Activities of Daily Living Home Assistive Devices/Equipment: Eyeglasses ADL Screening (condition at time of admission) Patient's cognitive ability adequate to safely complete daily activities?: Yes Is the patient deaf or have difficulty hearing?: No Does the patient have difficulty seeing, even when wearing glasses/contacts?: No Does the patient have difficulty concentrating, remembering, or making decisions?: No Patient able to express need for assistance with ADLs?: No Does the patient have difficulty dressing or bathing?: No Independently performs ADLs?: No Communication: Independent Dressing (OT): Independent Grooming: Independent Feeding:  Independent Bathing: Independent Toileting: Independent In/Out Bed: Independent Walks in Home: Independent Does the patient have difficulty walking or climbing stairs?: No Weakness of Legs: None Weakness of Arms/Hands: None  Permission Sought/Granted                  Emotional Assessment       Orientation: : Oriented to Situation,Oriented to  Time,Oriented to Place,Oriented to Self Alcohol / Substance Use: Not Applicable Psych Involvement: No (comment)  Admission diagnosis:  COVID-19 virus infection [U07.1] Patient Active Problem List   Diagnosis Date Noted  . COVID-19 virus infection 01/12/2020  . Sepsis (HCC) 01/19/2020  . Acute hypoxemic respiratory failure (HCC) 01/13/2020  . GI bleed 01/21/2020  . Atrial fibrillation with rapid ventricular response (HCC) 01/11/2020  . Peripheral vascular disease, unspecified (HCC) 03/18/2012  . Numbness of finger 03/18/2012  . Hip pain 03/18/2012   PCP:  Pcp, No Pharmacy:   [facility] [address], [place] - [address] [place] [address] Phone: [phone] Fax: [phone]     Social Determinants of Health (SDOH) Interventions    Readmission Risk Interventions No flowsheet data found.

## 2020-02-10 NOTE — Progress Notes (Incomplete)
Progress Note  Chief Complaint:         ASSESSMENT / PLAN:    # 74 yo female with melena in setting of NSAID use. Hgb down from 15.7 on admit two days ago to 12.6. Normal BUN   # Covid19            SUBJECTIVE:       OBJECTIVE:    Scheduled inpatient medications:  . vitamin C  500 mg Oral Daily  . atorvastatin  40 mg Oral Daily  . cholecalciferol  1,000 Units Oral Daily  . methylPREDNISolone (SOLU-MEDROL) injection  45 mg Intravenous Q12H   Followed by  . [START ON 02/11/2020] predniSONE  50 mg Oral Daily  . [START ON 02/13/2020] pantoprazole  40 mg Intravenous Q12H  . zinc sulfate  220 mg Oral Daily   Continuous inpatient infusions:  . lactated ringers 1,000 mL with sodium bicarbonate 100 mEq infusion 50 mL/hr at 01/25/2020 1129  . pantoprozole (PROTONIX) infusion 8 mg/hr (02/08/2020 0534)  . remdesivir 200 mg in sodium chloride 0.9% 250 mL IVPB Stopped (02/06/2020 2121)   Followed by  . remdesivir 100 mg in NS 100 mL 100 mg (01/21/2020 0917)   PRN inpatient medications: acetaminophen, albuterol, chlorpheniramine-HYDROcodone, fentaNYL (SUBLIMAZE) injection, guaiFENesin-dextromethorphan, ondansetron (ZOFRAN) IV, promethazine  Vital signs in last 24 hours: Temp:  [97.8 F (36.6 C)-98.6 F (37 C)] 98.4 F (36.9 C) (12/31 1200) Pulse Rate:  [63-114] 74 (12/31 0800) Resp:  [18-29] 24 (12/31 1200) BP: (98-154)/(47-86) 151/64 (12/31 1200) SpO2:  [87 %-95 %] 89 % (12/31 0800) Last BM Date: 02/09/20  Intake/Output Summary (Last 24 hours) at 01/19/2020 1407 Last data filed at 02/04/2020 0456 Gross per 24 hour  Intake 1259.84 ml  Output -  Net 1259.84 ml     Physical Exam:  . General: Alert, ***female in NAD . Heart:  Regular rate and rhythm. No lower extremity edema . Pulmonary: Normal respiratory effort . Abdomen: Soft, nondistended, nontender. Normal bowel sounds.  . Neurologic: Alert and oriented . Psych: Pleasant. Cooperative.   Filed Weights   02/09/2020  2139  Weight: 89.4 kg    Intake/Output from previous day: 12/30 0701 - 12/31 0700 In: 1259.8 [P.O.:120; I.V.:1139.8] Out: -  Intake/Output this shift: No intake/output data recorded.    Lab Results: Recent Labs    01/31/2020 1153 02/09/20 0232 01/18/2020 0342  WBC 5.7 4.4 6.8  HGB 15.7* 13.2 12.6  HCT 45.4 40.0 36.4  PLT 196 151 203   BMET Recent Labs    01/21/2020 1153 02/09/20 0232 01/15/2020 0342  NA 130* 130* 136  K 3.9 4.1 3.7  CL 96* 101 101  CO2 21* 17* 23  GLUCOSE 172* 177* 177*  BUN 20 22 16   CREATININE 1.20* 0.95 0.73  CALCIUM 8.2* 7.6* 7.9*   LFT Recent Labs    02/06/2020 2114 02/09/20 0232 02/02/2020 0342  PROT 5.6*   < > 5.4*  ALBUMIN 2.7*   < > 2.6*  AST 77*   < > 37  ALT 67*   < > 43  ALKPHOS 42   < > 45  BILITOT 0.5   < > 0.5  BILIDIR 0.2  --   --   IBILI 0.3  --   --    < > = values in this interval not displayed.   PT/INR No results for input(s): LABPROT, INR in the last 72 hours. Hepatitis Panel No results for input(s): HEPBSAG, HCVAB, [place], HEPBIGM in the  last 72 hours.  CT ANGIO CHEST PE W OR WO CONTRAST  Result Date: 02/09/2020 CLINICAL DATA:  COVID positive shortness of breath EXAM: CT ANGIOGRAPHY CHEST WITH CONTRAST TECHNIQUE: Multidetector CT imaging of the chest was performed using the standard protocol during bolus administration of intravenous contrast. Multiplanar CT image reconstructions and MIPs were obtained to evaluate the vascular anatomy. CONTRAST:  39mL OMNIPAQUE IOHEXOL 350 MG/ML SOLN COMPARISON:  None. FINDINGS: Cardiovascular: There is a optimal opacification of the pulmonary arteries. There is no central,segmental, or subsegmental filling defects within the pulmonary arteries. The heart is normal in size. No pericardial effusion or thickening. No evidence right heart strain. There is normal three-vessel brachiocephalic anatomy without proximal stenosis. Scattered aortic atherosclerosis is seen. Coronary artery  calcifications are present. Mediastinum/Nodes: No hilar, mediastinal, or axillary adenopathy. Thyroid gland, trachea, and esophagus demonstrate no significant findings. Lungs/Pleura: Multifocal patchy airspace opacities are seen predominantly within the periphery of both lungs. This is most notable at both lung bases. No pleural effusion or pneumothorax. Upper Abdomen: No acute abnormalities present in the visualized portions of the upper abdomen. Musculoskeletal: No chest wall abnormality. No acute or significant osseous findings. Review of the MIP images confirms the above findings. IMPRESSION: No central, segmental, or subsegmental pulmonary embolism. Multifocal patchy airspace opacities throughout both lungs, predominantly at both lung bases consistent with atypical viral pneumonia. Electronically Signed   By: Prudencio Pair M.D.   On: 02/09/2020 00:38   ECHOCARDIOGRAM COMPLETE  Result Date: 02/09/2020    ECHOCARDIOGRAM REPORT   Patient Name:   AMII AMBROSI Date of Exam: 02/09/2020 Medical Rec #:  [id number]  Height:       65.0 in Accession #:    [id number] Weight:       197.0 lb Date of Birth:  [birth year]   BSA:          1.965 m Patient Age:    63 years   BP:           95/46 mmHg Patient Gender: F          HR:           74 bpm. Exam Location:  Inpatient Procedure: 2D Echo, Color Doppler and Cardiac Doppler Indications:    I48.91* Unspeicified atrial fibrillation  History:        Patient has no prior history of Echocardiogram examinations.                 Risk Factors:Hypertension and Dyslipidemia.  Sonographer:    Bernadene Person RDCS Referring Phys: [id number] [place]  1. Left ventricular ejection fraction, by estimation, is 55 to 60%. The left ventricle has normal function. The left ventricle has no regional wall motion abnormalities. Left ventricular diastolic parameters are consistent with Grade II diastolic dysfunction (pseudonormalization).  2. Right ventricular systolic function is  normal. The right ventricular size is normal. There is normal pulmonary artery systolic pressure.  3. Left atrial size was mildly dilated.  4. The mitral valve is normal in structure. No evidence of mitral valve regurgitation. No evidence of mitral stenosis.  5. The aortic valve is tricuspid. Aortic valve regurgitation is trivial. Mild to moderate aortic valve sclerosis/calcification is present, without any evidence of aortic stenosis.  6. The inferior vena cava is dilated in size with >50% respiratory variability, suggesting right atrial pressure of 8 mmHg. FINDINGS  Left Ventricle: Left ventricular ejection fraction, by estimation, is 55 to 60%. The left ventricle has normal function. The left ventricle has  no regional wall motion abnormalities. The left ventricular internal cavity size was normal in size. There is  no left ventricular hypertrophy. Left ventricular diastolic parameters are consistent with Grade II diastolic dysfunction (pseudonormalization). Right Ventricle: The right ventricular size is normal. No increase in right ventricular wall thickness. Right ventricular systolic function is normal. There is normal pulmonary artery systolic pressure. The tricuspid regurgitant velocity is 2.15 m/s, and  with an assumed right atrial pressure of 8 mmHg, the estimated right ventricular systolic pressure is [id number] mmHg. Left Atrium: Left atrial size was mildly dilated. Right Atrium: Right atrial size was normal in size. Pericardium: There is no evidence of pericardial effusion. Mitral Valve: The mitral valve is normal in structure. No evidence of mitral valve regurgitation. No evidence of mitral valve stenosis. Tricuspid Valve: The tricuspid valve is normal in structure. Tricuspid valve regurgitation is not demonstrated. No evidence of tricuspid stenosis. Aortic Valve: The aortic valve is tricuspid. Aortic valve regurgitation is trivial. Mild to moderate aortic valve sclerosis/calcification is present, without any  evidence of aortic stenosis. Pulmonic Valve: The pulmonic valve was normal in structure. Pulmonic valve regurgitation is not visualized. No evidence of pulmonic stenosis. Aorta: The aortic root is normal in size and structure. Venous: The inferior vena cava is dilated in size with greater than 50% respiratory variability, suggesting right atrial pressure of 8 mmHg. IAS/Shunts: No atrial level shunt detected by color flow Doppler.  LEFT VENTRICLE PLAX 2D LVIDd:         4.50 cm  Diastology LVIDs:         2.60 cm  LV e' medial:    8.49 cm/s LV PW:         0.80 cm  LV E/e' medial:  13.4 LV IVS:        0.90 cm  LV e' lateral:   9.46 cm/s LVOT diam:     2.00 cm  LV E/e' lateral: 12.1 LV SV:         101 LV SV Index:   51 LVOT Area:     3.14 cm  RIGHT VENTRICLE RV S prime:     14.80 cm/s TAPSE (M-mode): 2.7 cm LEFT ATRIUM             Index       RIGHT ATRIUM           Index LA diam:        3.70 cm 1.88 cm/m  RA Area:     16.30 cm LA Vol (A2C):   57.9 ml 29.46 ml/m RA Volume:   39.70 ml  20.20 ml/m LA Vol (A4C):   75.0 ml 38.16 ml/m LA Biplane Vol: 67.6 ml 34.39 ml/m  AORTIC VALVE LVOT Vmax:   147.33 cm/s LVOT Vmean:  98.767 cm/s LVOT VTI:    0.320 m  AORTA Ao Root diam: 3.00 cm Ao Asc diam:  2.70 cm MITRAL VALVE                TRICUSPID VALVE MV Area (PHT): 2.95 cm     TR Peak grad:   18.5 mmHg MV Decel Time: 257 msec     TR Vmax:        215.00 cm/s MV E velocity: 114.00 cm/s MV A velocity: 71.70 cm/s   SHUNTS MV E/A ratio:  1.59         Systemic VTI:  0.32 m  Systemic Diam: 2.00 cm Donato Schultz MD Electronically signed by Donato Schultz MD Signature Date/Time: 02/09/2020/2:52:28 PM    Final       Principal Problem:   COVID-19 virus infection Active Problems:   Sepsis (HCC)   Acute hypoxemic respiratory failure (HCC)   GI bleed   Atrial fibrillation with rapid ventricular response (HCC)     LOS: 2 days   Willette Cluster ,NP 02/02/2020, 2:07 PM

## 2020-02-11 ENCOUNTER — Encounter (HOSPITAL_COMMUNITY): Payer: Self-pay | Admitting: Certified Registered Nurse Anesthetist

## 2020-02-11 ENCOUNTER — Inpatient Hospital Stay: Payer: Self-pay

## 2020-02-11 ENCOUNTER — Inpatient Hospital Stay (HOSPITAL_COMMUNITY): Payer: Medicare PPO

## 2020-02-11 DIAGNOSIS — I4891 Unspecified atrial fibrillation: Secondary | ICD-10-CM | POA: Diagnosis not present

## 2020-02-11 DIAGNOSIS — U071 COVID-19: Secondary | ICD-10-CM | POA: Diagnosis not present

## 2020-02-11 DIAGNOSIS — J9601 Acute respiratory failure with hypoxia: Secondary | ICD-10-CM | POA: Diagnosis not present

## 2020-02-11 LAB — COMPREHENSIVE METABOLIC PANEL
ALT: 35 U/L (ref 0–44)
AST: 33 U/L (ref 15–41)
Albumin: 2.5 g/dL — ABNORMAL LOW (ref 3.5–5.0)
Alkaline Phosphatase: 42 U/L (ref 38–126)
Anion gap: 11 (ref 5–15)
BUN: 15 mg/dL (ref 8–23)
CO2: 28 mmol/L (ref 22–32)
Calcium: 7.8 mg/dL — ABNORMAL LOW (ref 8.9–10.3)
Chloride: 102 mmol/L (ref 98–111)
Creatinine, Ser: 0.74 mg/dL (ref 0.44–1.00)
GFR, Estimated: >60 mL/min (ref >60)
Glucose, Bld: 186 mg/dL — ABNORMAL HIGH (ref 70–99)
Potassium: 3.9 mmol/L (ref 3.5–5.1)
Sodium: 141 mmol/L (ref 135–145)
Total Bilirubin: 0.8 mg/dL (ref 0.3–1.2)
Total Protein: 5.5 g/dL — ABNORMAL LOW (ref 6.5–8.1)

## 2020-02-11 LAB — CBC WITH DIFFERENTIAL/PLATELET
Abs Immature Granulocytes: 0.22 10*3/uL — ABNORMAL HIGH (ref 0.00–0.07)
Basophils Absolute: 0 10*3/uL (ref 0.0–0.1)
Basophils Relative: 0 %
Eosinophils Absolute: 0 10*3/uL (ref 0.0–0.5)
Eosinophils Relative: 0 %
HCT: 39.4 % (ref 36.0–46.0)
Hemoglobin: 12.9 g/dL (ref 12.0–15.0)
Immature Granulocytes: 2 %
Lymphocytes Relative: 4 %
Lymphs Abs: 0.4 10*3/uL — ABNORMAL LOW (ref 0.7–4.0)
MCH: 29.5 pg (ref 26.0–34.0)
MCHC: 32.7 g/dL (ref 30.0–36.0)
MCV: 90 fL (ref 80.0–100.0)
Monocytes Absolute: 0.5 10*3/uL (ref 0.1–1.0)
Monocytes Relative: 6 %
Neutro Abs: 8.2 10*3/uL — ABNORMAL HIGH (ref 1.7–7.7)
Neutrophils Relative %: 88 %
Platelets: 247 10*3/uL (ref 150–400)
RBC: 4.38 MIL/uL (ref 3.87–5.11)
RDW: 13.5 % (ref 11.5–15.5)
WBC: 9.4 10*3/uL (ref 4.0–10.5)
nRBC: 0 % (ref 0.0–0.2)

## 2020-02-11 LAB — MAGNESIUM: Magnesium: 2.2 mg/dL (ref 1.7–2.4)

## 2020-02-11 LAB — D-DIMER, QUANTITATIVE: D-Dimer, Quant: 2.51 ug/mL-FEU — ABNORMAL HIGH (ref 0.00–0.50)

## 2020-02-11 LAB — HEPARIN LEVEL (UNFRACTIONATED): Heparin Unfractionated: 1.01 IU/mL — ABNORMAL HIGH (ref 0.30–0.70)

## 2020-02-11 LAB — C-REACTIVE PROTEIN: CRP: 2.7 mg/dL — ABNORMAL HIGH (ref <1.0)

## 2020-02-11 LAB — APTT: aPTT: 66 seconds — ABNORMAL HIGH (ref 24–36)

## 2020-02-11 LAB — PROCALCITONIN: Procalcitonin: 2.04 ng/mL

## 2020-02-11 MED ORDER — METHYLPREDNISOLONE SODIUM SUCC 125 MG IJ SOLR
60.0000 mg | Freq: Two times a day (BID) | INTRAMUSCULAR | Status: DC
  Administered 2020-02-11 – 2020-02-16 (×11): 60 mg via INTRAVENOUS
  Filled 2020-02-11 (×11): qty 2

## 2020-02-11 MED ORDER — CHLORHEXIDINE GLUCONATE 0.12 % MT SOLN
15.0000 mL | Freq: Two times a day (BID) | OROMUCOSAL | Status: DC
  Administered 2020-02-11 – 2020-02-29 (×36): 15 mL via OROMUCOSAL
  Filled 2020-02-11 (×33): qty 15

## 2020-02-11 MED ORDER — DILTIAZEM HCL-DEXTROSE 125-5 MG/125ML-% IV SOLN (PREMIX)
5.0000 mg/h | INTRAVENOUS | Status: DC
  Administered 2020-02-11: 5 mg/h via INTRAVENOUS
  Administered 2020-02-11 – 2020-02-12 (×3): 15 mg/h via INTRAVENOUS
  Filled 2020-02-11 (×5): qty 125

## 2020-02-11 MED ORDER — SODIUM CHLORIDE 0.9% FLUSH
10.0000 mL | INTRAVENOUS | Status: DC | PRN
  Administered 2020-02-16: 20 mL
  Administered 2020-02-21 – 2020-02-22 (×2): 10 mL

## 2020-02-11 MED ORDER — DILTIAZEM LOAD VIA INFUSION
15.0000 mg | Freq: Once | INTRAVENOUS | Status: AC
  Administered 2020-02-11: 15 mg via INTRAVENOUS
  Filled 2020-02-11: qty 15

## 2020-02-11 MED ORDER — ORAL CARE MOUTH RINSE
15.0000 mL | Freq: Two times a day (BID) | OROMUCOSAL | Status: DC
  Administered 2020-02-12 – 2020-02-29 (×31): 15 mL via OROMUCOSAL

## 2020-02-11 MED ORDER — NYSTATIN 100000 UNIT/GM EX POWD
Freq: Three times a day (TID) | CUTANEOUS | Status: DC
  Filled 2020-02-11 (×2): qty 15

## 2020-02-11 MED ORDER — HEPARIN (PORCINE) 25000 UT/250ML-% IV SOLN
1000.0000 [IU]/h | INTRAVENOUS | Status: DC
  Administered 2020-02-11: 1300 [IU]/h via INTRAVENOUS
  Administered 2020-02-12: 1000 [IU]/h via INTRAVENOUS
  Filled 2020-02-11 (×2): qty 250

## 2020-02-11 MED ORDER — DIGOXIN 0.25 MG/ML IJ SOLN
0.2500 mg | Freq: Four times a day (QID) | INTRAMUSCULAR | Status: AC
  Administered 2020-02-11 (×2): 0.25 mg via INTRAVENOUS
  Filled 2020-02-11 (×2): qty 2

## 2020-02-11 MED ORDER — BARICITINIB 2 MG PO TABS
4.0000 mg | ORAL_TABLET | Freq: Every day | ORAL | Status: AC
  Administered 2020-02-11 – 2020-02-24 (×13): 4 mg via ORAL
  Filled 2020-02-11: qty 4
  Filled 2020-02-11 (×3): qty 2
  Filled 2020-02-11: qty 4
  Filled 2020-02-11 (×3): qty 2
  Filled 2020-02-11 (×2): qty 4
  Filled 2020-02-11 (×3): qty 2

## 2020-02-11 MED ORDER — CHLORHEXIDINE GLUCONATE CLOTH 2 % EX PADS
6.0000 | MEDICATED_PAD | Freq: Every day | CUTANEOUS | Status: DC
  Administered 2020-02-11 – 2020-02-29 (×17): 6 via TOPICAL

## 2020-02-11 MED ORDER — FUROSEMIDE 10 MG/ML IJ SOLN
40.0000 mg | Freq: Once | INTRAMUSCULAR | Status: AC
  Administered 2020-02-11: 40 mg via INTRAVENOUS
  Filled 2020-02-11: qty 4

## 2020-02-11 NOTE — Progress Notes (Signed)
Peripherally Inserted Central Catheter Placement  The IV Nurse has discussed with the patient and/or persons authorized to consent for the patient, the purpose of this procedure and the potential benefits and risks involved with this procedure.  The benefits include less needle sticks, lab draws from the catheter, and the patient may be discharged home with the catheter. Risks include, but not limited to, infection, bleeding, blood clot (thrombus formation), and puncture of an artery; nerve damage and irregular heartbeat and possibility to perform a PICC exchange if needed/ordered by physician.  Alternatives to this procedure were also discussed.  Bard Power PICC patient education guide, fact sheet on infection prevention and patient information card has been provided to patient /or left at bedside.    PICC Placement Documentation  PICC Triple Lumen 02/11/20 PICC Right Basilic 42 cm 0 cm (Active)  Indication for Insertion or Continuance of Line Vasoactive infusions 02/11/20 1100  Exposed Catheter (cm) 0 cm 02/11/20 1100  Site Assessment Clean;Dry;Intact 02/11/20 1100  Lumen #1 Status Flushed;Blood return noted 02/11/20 1100  Lumen #2 Status Flushed;Blood return noted 02/11/20 1100  Lumen #3 Status Flushed;Blood return noted 02/11/20 1100  Dressing Type Transparent 02/11/20 1100  Dressing Status Clean;Dry;Intact 02/11/20 1100  Antimicrobial disc in place? Yes 02/11/20 1100  Dressing Intervention New dressing 02/11/20 1100  Dressing Change Due 02/18/20 02/11/20 1100       Stacie Glaze Horton 02/11/2020, 11:25 AM

## 2020-02-11 NOTE — Progress Notes (Signed)
Removed PIV from right lateral forearm, dressing and catheter intact. Site moist and pink. Gauze dressing applied.

## 2020-02-11 NOTE — Progress Notes (Signed)
Called son back for update and was unable to reach him.

## 2020-02-11 NOTE — Progress Notes (Addendum)
Brief GI note:  Patient is scheduled to have EGD done today. She is not a candidate for anesthesia (per anesthesia) d/t respiratory failure with associated Covid pneumonia, A. fib with RVR  There is no further GI bleeding. Hb stable.  We have decided to hold off on EGD due to precarious respiratory situation.  I have discussed care with Dr. Huey Bienenstock who agrees to hold off  I would recommend to continue PPIs If [facility] clinically indicated, proceed with [facility] Please call us if we could be of any assistance or if she starts having any GI problems in future  We will sign off for now and will be available if needed  RG

## 2020-02-11 NOTE — Progress Notes (Signed)
ANTICOAGULATION CONSULT NOTE - Initial Consult  Pharmacy Consult for heparin Indication: atrial fibrillation  Allergies  Allergen Reactions  . Morphine And Related Other (See Comments)    Confusion and aggressive behavior.  . Chromium Rash  . Lactose Intolerance (Gi) Diarrhea    Patient Measurements: Height: 5\' 5"  (165.1 cm) Weight: 89.4 kg (197 lb) IBW/kg (Calculated) : 57 Heparin Dosing Weight: 75  Vital Signs: Temp: 98.9 F (37.2 C) (01/01 2100) Temp Source: Axillary (01/01 2100) BP: 142/62 (01/01 2100) Pulse Rate: 82 (01/01 2100)  Labs: Recent Labs    02/09/20 0232 01/22/2020 0342 02/11/20 0442 02/11/20 1554 02/11/20 2202  HGB 13.2 12.6 12.9  --   --   HCT 40.0 36.4 39.4  --   --   PLT 151 203 247  --   --   APTT  --   --   --  66*  --   HEPARINUNFRC  --   --   --   --  1.01*  CREATININE 0.95 0.73 0.74  --   --     Estimated Creatinine Clearance: 68.2 mL/min (by C-G formula based on SCr of 0.74 mg/dL).  Assessment: 75 y.o. female with Afib for heparin.    Goal of Therapy:  Heparin level 0.3-0.5 units/ml Monitor platelets by anticoagulation protocol: Yes   Plan:  Hold heparin x 45 min, then decrease heparin 1000 units/hr Follow-up am labs.   66  02/11/2020 11:00 PM

## 2020-02-11 NOTE — Progress Notes (Signed)
   02/11/20 0816  Assess: MEWS Score  Temp 98.2 F (36.8 C)  BP (!) 160/75  ECG Heart Rate (!) 155  SpO2 (!) 80 %  O2 Device HFNC;Non-rebreather Mask  O2 Flow Rate (L/min) 15 L/min  Assess: MEWS Score  MEWS Temp 0  MEWS Systolic 0  MEWS Pulse 3  MEWS RR 0  MEWS LOC 0  MEWS Score 3  MEWS Score Color Yellow  Assess: if the MEWS score is Yellow or Red  Were vital signs taken at a resting state? Yes  Focused Assessment Change from prior assessment (see assessment flowsheet)  Early Detection of Sepsis Score *See Row Information* Low  MEWS guidelines implemented *See Row Information* Yes  Treat  MEWS Interventions Administered scheduled meds/treatments;Administered prn meds/treatments  Pain Scale 0-10  Pain Score 0  Take Vital Signs  Increase Vital Sign Frequency  Yellow: Q 2hr X 2 then Q 4hr X 2, if remains yellow, continue Q 4hrs  Escalate  MEWS: Escalate Yellow: discuss with charge nurse/RN and consider discussing with provider and RRT  Notify: Charge Nurse/RN  Name of Charge Nurse/RN Notified Elisa  Date Charge Nurse/RN Notified 02/11/20  Time Charge Nurse/RN Notified [phone]  Notify: Provider  Provider Name/Title Dr. Randol Kern  Date Provider Notified 02/11/20  Time Provider Notified [phone]  Notification Type Call  Notification Reason Change in status  Response See new orders  Date of Provider Response 02/11/20  Time of Provider Response [phone]  Document  Patient Outcome Stabilized after interventions  Progress note created (see row info) Yes

## 2020-02-11 NOTE — Evaluation (Signed)
Occupational Therapy Evaluation Patient Details Name: Diane Mendoza MRN: [id number] DOB: 1946/01/01 Today's Date: 02/11/2020    History of Present Illness 75 y.o. female with medical history significant of hypertension, hyperlipidemia, PVD, S/p bil aorto-fem/pop) bypass grafting, GERD, history of cervical cancer COVID unvaccinated  presenting the ED 01/19/2020 with complaints of generalized weakness after testing positive for COVID 3 days ago  symptoms include fevers, chills, cough, shortness of breath, nausea, vomiting, and diarrhea. Admitted for treatment of Sepsis and acute hypoxemic respiratory failure secondary to COVID PNA, Active GI bleed, and new onset of Afib with RVR   Clinical Impression   PTA pt living with family and functioning at independent community level. At time of eval, pt able to complete bed mobility with min guard assist and sit <> stands with min A for steadying support. Upon standing pt c/o nausea and clammy feeling. BP + for orthostasis (see below for detailed vitals). Pt currently limited by poor activity tolerance and cardiopulmonary status to successfully engage in ADLs at desired level of independence. Given current status, recommend HHOT at d/c to progress ADLs in home environment. Will continue to follow as acutely admitted.  Vitals: Pt on 15L HFNC+15L NRB with SpO2 no greater than 90% at rest. With bed mobility SpO2 down to 82%. With standing SpO2 as low as 78%. Pt required ~5-7 min recovery with cues for pursed lip breathing.  BP supine: 135/60 BP after lying back down from standing and feeling nauseous: 88/71 BP after lying supine >1mins with legs elevated 130/67     Follow Up Recommendations  Home health OT;Supervision/Assistance - 24 hour    Equipment Recommendations  3 in 1 bedside commode    Recommendations for Other Services       Precautions / Restrictions Precautions Precautions: Fall Precaution Comments: watch sats/orthostatic Restrictions Weight  Bearing Restrictions: No      Mobility Bed Mobility Overal bed mobility: Needs Assistance Bed Mobility: Supine to Sit     Supine to sit: Min guard     General bed mobility comments: min guard for safety to come to EoB, increased time and effort and use of bed rail to come to EoB    Transfers Overall transfer level: Needs assistance Equipment used: 1 person hand held assist Transfers: Sit to/from Stand Sit to Stand: Min assist         General transfer comment: min A for assistance with power up to standing, vc for scooting forward to EoB for power up    Balance Overall balance assessment: Needs assistance Sitting-balance support: Feet supported;No upper extremity supported Sitting balance-Leahy Scale: Fair     Standing balance support: Bilateral upper extremity supported Standing balance-Leahy Scale: Poor Standing balance comment: requires HHA for maintianing standing balance                           ADL either performed or assessed with clinical judgement   ADL Overall ADL's : Needs assistance/impaired Eating/Feeding: Set up;Sitting Eating/Feeding Details (indicate cue type and reason): assist with NRB mask Grooming: Set up;Sitting   Upper Body Bathing: Moderate assistance;Sitting   Lower Body Bathing: Moderate assistance;Sitting/lateral leans;Sit to/from stand   Upper Body Dressing : Moderate assistance;Sitting   Lower Body Dressing: Moderate assistance;Sitting/lateral leans;Sit to/from stand   Toilet Transfer: Minimal assistance;Cueing for safety;Cueing for sequencing;Stand-pivot;BSC           Functional mobility during ADLs: Minimal assistance (side steps only) General ADL Comments: general mod A for  ADLs due to extreme fatigue/de saturation with exertion     Vision Patient Visual Report: No change from baseline       Perception     Praxis      Pertinent Vitals/Pain Pain Assessment: No/denies pain     Hand Dominance      Extremity/Trunk Assessment Upper Extremity Assessment Upper Extremity Assessment: Generalized weakness   Lower Extremity Assessment Lower Extremity Assessment: Generalized weakness       Communication Communication Communication: No difficulties   Cognition Arousal/Alertness: Awake/alert Behavior During Therapy: WFL for tasks assessed/performed Overall Cognitive Status: Impaired/Different from baseline Area of Impairment: Safety/judgement;Problem solving                         Safety/Judgement: Decreased awareness of deficits   Problem Solving: Slow processing;Requires verbal cues General Comments: slight decreased awareness of deficits/problem solving. Will further assess   General Comments  15L HFNC+15L NRB,orthostatic with standing requires return to bed and elevation of LE for BP to return to prior level    Exercises     Shoulder Instructions      Home Living Family/patient expects to be discharged to:: Private residence Living Arrangements: Children (son) Available Help at Discharge: Family;Available 24 hours/day Type of Home: Apartment Home Access: Stairs to enter Entrance Stairs-Number of Steps: 3 Entrance Stairs-Rails: None Home Layout: One level     Bathroom Shower/Tub: Chief Strategy Officer: Standard Bathroom Accessibility: Yes How Accessible: Accessible via walker Home Equipment: None   Additional Comments: Son sick with COVID currently, but works from home      Prior Functioning/Environment Level of Independence: Independent        Comments: completes all ADL/IADL. Independent and active- able to get out and walk around grocery store.        OT Problem List: Decreased strength;Decreased knowledge of use of DME or AE;Decreased knowledge of precautions;Decreased activity tolerance;Cardiopulmonary status limiting activity;Impaired balance (sitting and/or standing);Decreased safety awareness      OT  Treatment/Interventions: Self-care/ADL training;Therapeutic exercise;Patient/family education;Balance training;Energy conservation;Therapeutic activities;DME and/or AE instruction    OT Goals(Current goals can be found in the care plan section) Acute Rehab OT Goals Patient Stated Goal: get home OT Goal Formulation: With patient Time For Goal Achievement: 02/25/20 Potential to Achieve Goals: Good  OT Frequency: Min 3X/week   Barriers to D/C:            Co-evaluation PT/OT/SLP Co-Evaluation/Treatment: Yes Reason for Co-Treatment: Complexity of the patient's impairments (multi-system involvement) PT goals addressed during session: Mobility/safety with mobility OT goals addressed during session: ADL's and self-care;Strengthening/ROM      AM-PAC OT "6 Clicks" Daily Activity     Outcome Measure Help from another person eating meals?: A Little Help from another person taking care of personal grooming?: A Little Help from another person toileting, which includes using toliet, bedpan, or urinal?: A Lot Help from another person bathing (including washing, rinsing, drying)?: A Lot Help from another person to put on and taking off regular upper body clothing?: A Lot Help from another person to put on and taking off regular lower body clothing?: A Lot 6 Click Score: 14   End of Session Equipment Utilized During Treatment: Oxygen Nurse Communication: Mobility status  Activity Tolerance: Treatment limited secondary to medical complications (Comment) (orthostasis) Patient left: in bed;with call bell/phone within reach  OT Visit Diagnosis: Unsteadiness on feet (R26.81);Other abnormalities of gait and mobility (R26.89);Muscle weakness (generalized) (M62.81)  Time: [id number] OT Time Calculation (min): 47 min Charges:  OT General Charges $OT Visit: 1 Visit OT Evaluation $OT Eval Moderate Complexity: 1 Mod OT Treatments $Self Care/Home Management : 8-22 mins  Zenovia Jarred,  MSOT, OTR/L Acute Rehabilitation Services [facility] Office Number: [phone] Pager: [phone]  Zenovia Jarred 02/11/2020, 5:13 PM

## 2020-02-11 NOTE — Evaluation (Signed)
Physical Therapy Evaluation Patient Details Name: Diane Mendoza MRN: [id number] DOB: 1945/12/12 Today's Date: 02/11/2020   History of Present Illness  75 y.o. female with medical history significant of hypertension, hyperlipidemia, PVD, S/p bil aorto-fem/pop) bypass grafting, GERD, history of cervical cancer COVID unvaccinated  presenting the ED 01/21/2020 with complaints of generalized weakness after testing positive for COVID 3 days ago  symptoms include fevers, chills, cough, shortness of breath, nausea, vomiting, and diarrhea. Admitted for treatment of Sepsis and acute hypoxemic respiratory failure secondary to COVID PNA, Active GI bleed, and new onset of Afib with RVR  Clinical Impression  PTA pt living with son in apartment with 5 steps to enter. Pt reports complete independence, ambulating community distances without AD. Pt is currently limited in safe mobility by orthostatic hypotension and increased O2 demand, requiring 15L HFNC and 15L NRB, in presence of decreased strength and endurance. Pt is min guard for bed mobility, and min A for transfers and stepping toward HoB. Pt with nausea and hot clammy feeling with drop in BP. PT recommending HHPT level rehab at discharge to improve mobility in her home environment. PT will continue to follow acutely.   15L HFNC+15L NRB BP: supine 135/60; after sitting feeling nauseous-  88/71 (after back supine) 130/67 (after >3 min supine and legs elevated) 88% supine; 83% EOB- cues for breathing; 78% standing steps to [facility]    Follow Up Recommendations Home health PT;Supervision/Assistance - 24 hour    Equipment Recommendations  Rolling walker with 5" wheels       Precautions / Restrictions Precautions Precautions: None Restrictions Weight Bearing Restrictions: No      Mobility  Bed Mobility Overal bed mobility: Needs Assistance Bed Mobility: Supine to Sit     Supine to sit: Min guard     General bed mobility comments: min guard for safety to  come to EoB, increased time and effort and use of bed rail to come to EoB    Transfers Overall transfer level: Needs assistance Equipment used: 1 person hand held assist Transfers: Sit to/from Stand Sit to Stand: Min assist         General transfer comment: min A for assistance with power up to standing, vc for scooting forward to EoB for power up  Ambulation/Gait Ambulation/Gait assistance: Min assist Gait Distance (Feet): 3 Feet Assistive device: 1 person hand held assist Gait Pattern/deviations: Step-to pattern;Decreased step length - right;Decreased step length - left;Shuffle Gait velocity: slowed Gait velocity interpretation: <1.31 ft/sec, indicative of household ambulator General Gait Details: min A for steadying with stepping towards HoB, pt with c/o of nausea and requestt o sit down         Balance Overall balance assessment: Needs assistance Sitting-balance support: Feet supported;No upper extremity supported Sitting balance-Leahy Scale: Fair     Standing balance support: Bilateral upper extremity supported Standing balance-Leahy Scale: Poor Standing balance comment: requires HHA for maintianing standing balance                             Pertinent Vitals/Pain Pain Assessment: No/denies pain    Home Living Family/patient expects to be discharged to:: Private residence Living Arrangements: Children (son) Available Help at Discharge: Family;Available 24 hours/day Type of Home: Apartment Home Access: Stairs to enter Entrance Stairs-Rails: None Entrance Stairs-Number of Steps: 3 Home Layout: One level   Additional Comments: Son sick with COVID currently, but works from home    Prior Function Level of Independence:  Independent         Comments: completes all ADL/IADL. Independent and active- able to get out and walk around grocery store.        Extremity/Trunk Assessment   Upper Extremity Assessment Upper Extremity Assessment: Defer  to OT evaluation    Lower Extremity Assessment Lower Extremity Assessment: Generalized weakness       Communication   Communication: No difficulties  Cognition Arousal/Alertness: Awake/alert Behavior During Therapy: WFL for tasks assessed/performed Overall Cognitive Status: Within Functional Limits for tasks assessed                                        General Comments General comments (skin integrity, edema, etc.): 15L HFNC+15L NRB,orthostatic with standing requires return to bed and elevation of LE for BP to return to prior level        Assessment/Plan    PT Assessment Patient needs continued PT services  PT Problem List Decreased strength;Decreased activity tolerance;Decreased balance;Decreased mobility;Cardiopulmonary status limiting activity       PT Treatment Interventions DME instruction;Gait training;Stair training;Functional mobility training;Therapeutic activities;Therapeutic exercise;Balance training;Cognitive remediation;Patient/family education    PT Goals (Current goals can be found in the Care Plan section)  Acute Rehab PT Goals Patient Stated Goal: get home PT Goal Formulation: With patient Time For Goal Achievement: 02/25/20 Potential to Achieve Goals: Fair    Frequency Min 3X/week        Co-evaluation PT/OT/SLP Co-Evaluation/Treatment: Yes Reason for Co-Treatment: Complexity of the patient's impairments (multi-system involvement) PT goals addressed during session: Mobility/safety with mobility         AM-PAC PT "6 Clicks" Mobility  Outcome Measure Help needed turning from your back to your side while in a flat bed without using bedrails?: None Help needed moving from lying on your back to sitting on the side of a flat bed without using bedrails?: None Help needed moving to and from a bed to a chair (including a wheelchair)?: A Little Help needed standing up from a chair using your arms (e.g., wheelchair or bedside chair)?: A  Little Help needed to walk in hospital room?: A Little Help needed climbing 3-5 steps with a railing? : A Lot 6 Click Score: 19    End of Session Equipment Utilized During Treatment: Oxygen Activity Tolerance: Treatment limited secondary to medical complications (Comment) (orthostatic response to standing) Patient left: in bed;with call bell/phone within reach;with bed alarm set Nurse Communication: Mobility status;Other (comment) (increased O2 demand and decreased tolerance to large bore  for 15 L HFNC) PT Visit Diagnosis: Unsteadiness on feet (R26.81);Other abnormalities of gait and mobility (R26.89);Muscle weakness (generalized) (M62.81);Difficulty in walking, not elsewhere classified (R26.2)    Time: [id number] PT Time Calculation (min) (ACUTE ONLY): 47 min   Charges:   PT Evaluation $PT Eval Moderate Complexity: 1 Mod          Lovena Kluck B. Migdalia Dk PT, DPT Acute Rehabilitation Services Pager [phone] Office [phone]   [place] 02/11/2020, 4:41 PM

## 2020-02-11 NOTE — Progress Notes (Signed)
ANTICOAGULATION CONSULT NOTE - Initial Consult  Pharmacy Consult for heparin Indication: atrial fibrillation  Allergies  Allergen Reactions  . Morphine And Related Other (See Comments)    Confusion and aggressive behavior.  . Chromium Rash  . Lactose Intolerance (Gi) Diarrhea    Patient Measurements: Height: 5\' 5"  (165.1 cm) Weight: 89.4 kg (197 lb) IBW/kg (Calculated) : 57 Heparin Dosing Weight: 89  Vital Signs: Temp: 97.6 F (36.4 C) (01/01 1211) Temp Source: Axillary (01/01 1211) BP: 150/84 (01/01 1211) Pulse Rate: 96 (01/01 0418)  Labs: Recent Labs    02/09/20 0232 01/21/2020 0342 02/11/20 0442  HGB 13.2 12.6 12.9  HCT 40.0 36.4 39.4  PLT 151 203 247  CREATININE 0.95 0.73 0.74    Estimated Creatinine Clearance: 68.2 mL/min (by C-G formula based on SCr of 0.74 mg/dL).   Medications:  Digoxin  Diltiazem  Metoprolol   Assessment: 47 yof presenting with COVID and new onset Afib. Upon admission, patient reported melena for the past 4 weeks. FOBT was positive, Hgb stable in the 12s, Plts 200s. Endoscopy was planned for 1/1 but abandoned due to clinical picture. No significant GI bleed currently, no melena, but did report a dark stool on 12/31. As patient remains in Afib and has elevated D-dimers, the decision was made to initiate heparin therapy without a bolus and targeting the lower end of the therapeutic range.    Goal of Therapy:  Heparin level 0.3-0.5 units/ml Monitor platelets by anticoagulation protocol: Yes   Plan:  Begin heparin 1300units/hr with no bolus  Check 8-hour heparin level at 2200, targeting the low end of therapeutic goal Monitor closely for s/sx of bleed  Daily HL and CBC   1/32, PharmD, [facility] Pharmacy Resident [phone] 02/11/2020 1:14 PM

## 2020-02-11 NOTE — Progress Notes (Signed)
Dr. Randol Kern notified of patient status. Patient has gone back into A.fib with a HR in the 160's and O2 sat 80% on 15 HFNC. He came to see patient. New orders followed out and patient placed on 100% NRB.

## 2020-02-11 NOTE — Progress Notes (Signed)
Spoke with radiology.  States will obtain stat CXR ordered @1127 , states were unable to confirm if a repeat order.

## 2020-02-11 NOTE — Progress Notes (Addendum)
PROGRESS NOTE    Diane Mendoza  YQM:[id number] DOB: 11-May-1945 DOA: 01/15/2020 PCP: Pcp, No   Brief Narrative:   Diane Mendoza is a 75 y.o. female with medical history significant of hypertension, hyperlipidemia, PVD, GERD, history of cervical cancer status presenting the ED with complaints of generalized weakness after testing positive for COVID 3 days before admission, she was Febrile with temperature 101.3 F.  Found to be in new onset A. fib with rate up to 130s. SPO2 88% on room air, improved with 2 L supplemental oxygen.  Tachypneic with respiratory rate up to 30s.  Not hypotensive.  WBC 5.7, hemoglobin 15.7, hematocrit 45.4, platelet 196K.  Sodium 130, potassium 3.9, chloride 96, bicarb 21, anion gap 13, BUN 20, creatinine 1.2, glucose 172.  Lipase normal.  FOBT positive.  Subjective: Patient seen and examined.  Patient with worsening oxygen requirement overnight, she does report worsening shortness of breath, as discussed with staff, no melena, she had dark brown bowel movement .   Assessment & Plan:   Principal Problem:   COVID-19 virus infection Active Problems:   Sepsis (HCC)   Acute hypoxemic respiratory failure (HCC)   GI bleed   Atrial fibrillation with rapid ventricular response (HCC)   Acute hypoxemic respiratory failure secondary to suspected COVID-19 viral pneunia -She is unvaccinated - Continue remdesivir  -Continue with IV Solu-Medrol . -Patient with worsening hypoxia, and oxygen requirement overnight, she is on 15 L HFNC, and 15 L NRB, , chest x-ray with worsening with 19 of pneumonia . -I have discussed baricitinib, with patient, and son, patient with no history of TB, diverticulitis, cancer, immunotherapy, viral hepatitis, procalcitonin significantly trending down, discussed risks and benefits, patient wants to proceed with baricitinib, she will be started today . -Was encouraged to use incentive spirometry, flutter valve, out of bed to chair as able . -Some  evidence of pleural effusion, will give 1 dose of IV Lasix . -Patient respiratory status is very tenuous, she is high risk for intubation . -Continue with IV antibiotics, procalcitonin trending down, continue to monitor  Melena/ active GI bleed: - Patient reports having melena for the past 4 weeks.  FOBT positive, however, hemoglobin stable  -GI input greatly appreciated, plan for endoscopy this morning was aborted given his worsening clinical picture, with A. fib with RVR and worsening hypoxia. -Patient with no significant GI bleed currently, no melena, she had dark brown color stool yesterday, and her significant elevated CHA2DS2-VASc score, and elevated D-dimers, will proceed with heparin GTT with no bolus, to be kept within lower therapeutic range, I have discussed this with patient and son, agreeable with plan,. -Continue with Protonix, and monitor H&H closely.   New onset A. fib with RVR:  - No documented history of A. Fib -In A. fib with RVR heart rate in the 150s, started on Cardizem drip, loaded with digoxin.  She is on low-dose metoprolol. - CHA2DS2VASc 4 -we will start on GTT, given stable hemoglobin, and no evidence of significant GI bleed, especially endoscopy unlikely to be done anytime soon and she is at increased risk of stroke . - Echocardiogram ordered.  TSH normal.   Mild hyponatremia:  - resolved  Mild normal anion gap metabolic acidosis:  - Likely due to diarrhea.  White bicarb drip initially  Mild AKI: Resolved.  Function  Hypertension: -Blood pressures controlled, she is currently on metoprolol and Cardizem drip]  Hyperlipidemia -Resume home statin     DVT prophylaxis: SCDs Start: 02/01/2020 2016   Code Status:  Full Code  Family Communication: None present at bedside.  D/W son  12/31.02/11/2020  Remains inpatient appropriate because:Ongoing diagnostic testing needed not appropriate for outpatient work up   Dispo: The patient is from: Home               Anticipated d/c is to: Home              Anticipated d/c date is: 2 days              Patient currently is not medically stable to d/c.        Estimated body mass index is 32.78 kg/m as calculated from the following:   Height as of this encounter: 5\' 5"  (1.651 m).   Weight as of this encounter: 89.4 kg.              Consultants:   GI  Procedures:   None  Antimicrobials:  Anti-infectives (From admission, onward)   Start     Dose/Rate Route Frequency Ordered Stop   02/11/20 1000  remdesivir 100 mg in sodium chloride 0.9 % 100 mL IVPB        100 mg 200 mL/hr over 30 Minutes Intravenous Daily 02/06/2020 1426 02/14/20 0959   01/20/2020 1730  azithromycin (ZITHROMAX) 500 mg in sodium chloride 0.9 % 250 mL IVPB        500 mg 250 mL/hr over 60 Minutes Intravenous Every 24 hours 01/31/2020 1636     01/30/2020 1730  cefTRIAXone (ROCEPHIN) 2 g in sodium chloride 0.9 % 100 mL IVPB        2 g 200 mL/hr over 30 Minutes Intravenous Every 24 hours 01/14/2020 1636     02/09/20 1000  remdesivir 100 mg in sodium chloride 0.9 % 100 mL IVPB  Status:  Discontinued       "Followed by" Linked Group Details   100 mg 200 mL/hr over 30 Minutes Intravenous Daily 01/25/2020 2020 01/20/2020 1426   01/11/2020 2030  remdesivir 200 mg in sodium chloride 0.9% 250 mL IVPB  Status:  Discontinued       "Followed by" Linked Group Details   200 mg 580 mL/hr over 30 Minutes Intravenous Once 01/28/2020 2020 02/09/2020 1426           Objective: Vitals:   02/11/20 0930 02/11/20 1000 02/11/20 1014 02/11/20 1045  BP: 122/73 (!) 147/106 (!) 147/119   Pulse:      Resp:   (!) 22   Temp:   98.4 F (36.9 C)   TempSrc:   Oral   SpO2:   (!) 82% (!) 86%  Weight:      Height:        Intake/Output Summary (Last 24 hours) at 02/11/2020 1145 Last data filed at 02/11/2020 0905 Gross per 24 hour  Intake 1928.77 ml  Output 500 ml  Net 1428.77 ml   Filed Weights   02/07/2020 2139  Weight: 89.4 kg     Examination:  Awake Alert, Oriented X 3, streamer frail, deconditioned no new F.N deficits, Normal affect Symmetrical Chest wall movement, Good air movement bilaterally,scattered  rhonchi IRR IRR ,No Gallops,Rubs or new Murmurs, No Parasternal Heave +ve B.Sounds, Abd Soft, No tenderness, No rebound - guarding or rigidity. No Cyanosis, Clubbing or edema, No new Rash or bruise       Data Reviewed: I have personally reviewed following labs and imaging studies  CBC: Recent Labs  Lab 01/26/2020 1153 02/09/20 0232 01/16/2020 0342 02/11/20 0442  WBC 5.7 4.4  6.8 9.4  NEUTROABS  --  3.8 5.8 8.2*  HGB 15.7* 13.2 12.6 12.9  HCT 45.4 40.0 36.4 39.4  MCV 88.7 92.0 86.9 90.0  PLT 196 151 203 [id number]   Basic Metabolic Panel: Recent Labs  Lab 01/12/2020 1153 02/09/20 0232 01/21/2020 0342 02/11/20 0442  NA 130* 130* 136 141  K 3.9 4.1 3.7 3.9  CL 96* 101 101 102  CO2 21* 17* 23 28  GLUCOSE 172* 177* 177* 186*  BUN 20 22 16 15   CREATININE 1.20* 0.95 0.73 0.74  CALCIUM 8.2* 7.6* 7.9* 7.8*  MG  --   --   --  2.2   GFR: Estimated Creatinine Clearance: 68.2 mL/min (by C-G formula based on SCr of 0.74 mg/dL). Liver Function Tests: Recent Labs  Lab 01/31/2020 2114 02/09/20 0232 01/23/2020 0342 02/11/20 0442  AST 77* 59* 37 33  ALT 67* 60* 43 35  ALKPHOS 42 41 45 42  BILITOT 0.5 0.7 0.5 0.8  PROT 5.6* 5.4* 5.4* 5.5*  ALBUMIN 2.7* 2.8* 2.6* 2.5*   Recent Labs  Lab 01/30/2020 1153  LIPASE 29   No results for input(s): AMMONIA in the last 168 hours. Coagulation Profile: No results for input(s): INR, PROTIME in the last 168 hours. Cardiac Enzymes: No results for input(s): CKTOTAL, CKMB, CKMBINDEX, TROPONINI in the last 168 hours. BNP (last 3 results) No results for input(s): PROBNP in the last 8760 hours. HbA1C: No results for input(s): HGBA1C in the last 72 hours. CBG: Recent Labs  Lab 01/31/2020 1148  GLUCAP 190*   Lipid Profile: No results for input(s): CHOL, HDL, LDLCALC,  TRIG, CHOLHDL, LDLDIRECT in the last 72 hours. Thyroid Function Tests: Recent Labs    02/09/2020 2114  TSH 1.246   Anemia Panel: Recent Labs    01/16/2020 2114  FERRITIN 2,256*   Sepsis Labs: Recent Labs  Lab 01/17/2020 2114 02/11/20 0442  PROCALCITON 18.67 2.04  LATICACIDVEN 1.5  --     Recent Results (from the past 240 hour(s))  Resp Panel by RT-PCR (Flu A&B, Covid) Nasopharyngeal Swab     Status: Abnormal   Collection Time: 01/30/2020  6:28 PM   Specimen: Nasopharyngeal Swab; Nasopharyngeal(NP) swabs in vial transport medium  Result Value Ref Range Status   SARS Coronavirus 2 by RT PCR POSITIVE (A) NEGATIVE Final    Comment: RESULT CALLED TO, READ BACK BY AND VERIFIED WITH: Rick Duff RN 01/23/2020 AT 2213 SK  (NOTE) SARS-CoV-2 target nucleic acids are DETECTED.  The SARS-CoV-2 RNA is generally detectable in upper respiratory specimens during the acute phase of infection. Positive results are indicative of the presence of the identified virus, but do not rule out bacterial infection or co-infection with other pathogens not detected by the test. Clinical correlation with patient history and other diagnostic information is necessary to determine patient infection status. The expected result is Negative.  Fact Sheet for Patients: EntrepreneurPulse.com.au  Fact Sheet for Healthcare Providers: IncredibleEmployment.be  This test is not yet approved or cleared by the [place] FDA and  has been authorized for detection and/or diagnosis of SARS-CoV-2 by FDA under an Emergency Use Authorization (EUA).  This EUA will remain in effect (meaning this test can be  used) for the duration of  the COVID-19 declaration under Section 564(b)(1) of the Act, 21 U.S.C. section 360bbb-3(b)(1), unless the authorization is terminated or revoked sooner.     Influenza A by PCR NEGATIVE NEGATIVE Final   Influenza B by PCR NEGATIVE NEGATIVE Final  Comment:  (NOTE) The Xpert Xpress SARS-CoV-2/FLU/RSV plus assay is intended as an aid in the diagnosis of influenza from Nasopharyngeal swab specimens and should not be used as a sole basis for treatment. Nasal washings and aspirates are unacceptable for Xpert Xpress SARS-CoV-2/FLU/RSV testing.  Fact Sheet for Patients: BloggerCourse.comhttps://www.fda.gov/media/152166/download  Fact Sheet for Healthcare Providers: SeriousBroker.ithttps://www.fda.gov/media/152162/download  This test is not yet approved or cleared by the [place] FDA and has been authorized for detection and/or diagnosis of SARS-CoV-2 by FDA under an Emergency Use Authorization (EUA). This EUA will remain in effect (meaning this test can be used) for the duration of the COVID-19 declaration under Section 564(b)(1) of the Act, 21 U.S.C. section 360bbb-3(b)(1), unless the authorization is terminated or revoked.  Performed at [facility], [address] [place], [place] [address]   Culture, blood (Routine X 2) w Reflex to ID Panel     Status: None (Preliminary result)   Collection Time: 01/19/2020  9:14 PM   Specimen: BLOOD LEFT ARM  Result Value Ref Range Status   Specimen Description BLOOD LEFT ARM  Final   Special Requests   Final    BOTTLES DRAWN AEROBIC AND ANAEROBIC Blood Culture adequate volume   Culture   Final    NO GROWTH 3 DAYS Performed at [facility], [address] [place], [place] [address]    Report Status PENDING  Incomplete  Culture, blood (Routine X 2) w Reflex to ID Panel     Status: None (Preliminary result)   Collection Time: 02/09/20  9:07 AM   Specimen: Site Not Specified; Blood  Result Value Ref Range Status   Specimen Description SITE NOT SPECIFIED  Final   Special Requests   Final    BOTTLES DRAWN AEROBIC AND ANAEROBIC Blood Culture results may not be optimal due to an excessive volume of blood received in culture bottles   Culture   Final    NO GROWTH 2 DAYS Performed at [facility], [address] [place], [place] [address]    Report Status PENDING  Incomplete      Radiology Studies: DG Chest Port 1 View  Result Date: 02/11/2020 CLINICAL DATA:  Cough.  COVID positive. EXAM: PORTABLE CHEST 1 VIEW COMPARISON:  February 08, 2020 FINDINGS: The heart size and mediastinal contours are stable. Diffuse increased pulmonary interstitium is identified bilaterally. There is a small right pleural effusion. The visualized skeletal structures are unremarkable. IMPRESSION: Diffuse increased pulmonary interstitium is identified bilaterally with small right pleural effusion. Findings can be seen in pneumonitis or interstitial edema. Electronically Signed   By: Sherian ReinWei-Chen  Lin M.D.   On: 02/11/2020 09:36   ECHOCARDIOGRAM COMPLETE  Result Date: 02/09/2020    ECHOCARDIOGRAM REPORT   Patient Name:   Glennon MacMARY Ryland Date of Exam: 02/09/2020 Medical Rec #:  [id number]  Height:       65.0 in Accession #:    [id number] Weight:       197.0 lb Date of Birth:  06/02/1945   BSA:          1.965 m Patient Age:    74 years   BP:           95/46 mmHg Patient Gender: F          HR:           74 bpm. Exam Location:  Inpatient Procedure: 2D Echo, Color Doppler and Cardiac Doppler Indications:    I48.91* Unspeicified atrial fibrillation  History:  Patient has no prior history of Echocardiogram examinations.                 Risk Factors:Hypertension and Dyslipidemia.  Sonographer:    Eulah PontSarah Pirrotta RDCS Referring Phys: [id number] VASUNDHRA RATHORE IMPRESSIONS  1. Left ventricular ejection fraction, by estimation, is 55 to 60%. The left ventricle has normal function. The left ventricle has no regional wall motion abnormalities. Left ventricular diastolic parameters are consistent with Grade II diastolic dysfunction (pseudonormalization).  2. Right ventricular systolic function is normal. The right ventricular size is normal. There is normal pulmonary artery systolic pressure.  3. Left atrial size was mildly dilated.  4. The mitral valve  is normal in structure. No evidence of mitral valve regurgitation. No evidence of mitral stenosis.  5. The aortic valve is tricuspid. Aortic valve regurgitation is trivial. Mild to moderate aortic valve sclerosis/calcification is present, without any evidence of aortic stenosis.  6. The inferior vena cava is dilated in size with >50% respiratory variability, suggesting right atrial pressure of 8 mmHg. FINDINGS  Left Ventricle: Left ventricular ejection fraction, by estimation, is 55 to 60%. The left ventricle has normal function. The left ventricle has no regional wall motion abnormalities. The left ventricular internal cavity size was normal in size. There is  no left ventricular hypertrophy. Left ventricular diastolic parameters are consistent with Grade II diastolic dysfunction (pseudonormalization). Right Ventricle: The right ventricular size is normal. No increase in right ventricular wall thickness. Right ventricular systolic function is normal. There is normal pulmonary artery systolic pressure. The tricuspid regurgitant velocity is 2.15 m/s, and  with an assumed right atrial pressure of 8 mmHg, the estimated right ventricular systolic pressure is 26.5 mmHg. Left Atrium: Left atrial size was mildly dilated. Right Atrium: Right atrial size was normal in size. Pericardium: There is no evidence of pericardial effusion. Mitral Valve: The mitral valve is normal in structure. No evidence of mitral valve regurgitation. No evidence of mitral valve stenosis. Tricuspid Valve: The tricuspid valve is normal in structure. Tricuspid valve regurgitation is not demonstrated. No evidence of tricuspid stenosis. Aortic Valve: The aortic valve is tricuspid. Aortic valve regurgitation is trivial. Mild to moderate aortic valve sclerosis/calcification is present, without any evidence of aortic stenosis. Pulmonic Valve: The pulmonic valve was normal in structure. Pulmonic valve regurgitation is not visualized. No evidence of  pulmonic stenosis. Aorta: The aortic root is normal in size and structure. Venous: The inferior vena cava is dilated in size with greater than 50% respiratory variability, suggesting right atrial pressure of 8 mmHg. IAS/Shunts: No atrial level shunt detected by color flow Doppler.  LEFT VENTRICLE PLAX 2D LVIDd:         4.50 cm  Diastology LVIDs:         2.60 cm  LV e' medial:    8.49 cm/s LV PW:         0.80 cm  LV E/e' medial:  13.4 LV IVS:        0.90 cm  LV e' lateral:   9.46 cm/s LVOT diam:     2.00 cm  LV E/e' lateral: 12.1 LV SV:         101 LV SV Index:   51 LVOT Area:     3.14 cm  RIGHT VENTRICLE RV S prime:     14.80 cm/s TAPSE (M-mode): 2.7 cm LEFT ATRIUM             Index       RIGHT ATRIUM  Index LA diam:        3.70 cm 1.88 cm/m  RA Area:     16.30 cm LA Vol (A2C):   57.9 ml 29.46 ml/m RA Volume:   39.70 ml  20.20 ml/m LA Vol (A4C):   75.0 ml 38.16 ml/m LA Biplane Vol: 67.6 ml 34.39 ml/m  AORTIC VALVE LVOT Vmax:   147.33 cm/s LVOT Vmean:  98.767 cm/s LVOT VTI:    0.320 m  AORTA Ao Root diam: 3.00 cm Ao Asc diam:  2.70 cm MITRAL VALVE                TRICUSPID VALVE MV Area (PHT): 2.95 cm     TR Peak grad:   18.5 mmHg MV Decel Time: 257 msec     TR Vmax:        215.00 cm/s MV E velocity: 114.00 cm/s MV A velocity: 71.70 cm/s   SHUNTS MV E/A ratio:  1.59         Systemic VTI:  0.32 m                             Systemic Diam: 2.00 cm Candee Furbish MD Electronically signed by Candee Furbish MD Signature Date/Time: 02/09/2020/2:52:28 PM    Final    [place] EKG SITE RITE  Result Date: 02/11/2020 If Site Rite image not attached, placement could not be confirmed due to current cardiac rhythm.   Scheduled Meds: . vitamin C  500 mg Oral Daily  . atorvastatin  40 mg Oral Daily  . baricitinib  4 mg Oral Daily  . cholecalciferol  1,000 Units Oral Daily  . digoxin  0.25 mg Intravenous Q6H  . furosemide  40 mg Intravenous Once  . metoprolol tartrate  12.5 mg Oral BID  . nystatin   Topical TID  .  [START ON 02/21/2020] pantoprazole  40 mg Intravenous Q12H  . predniSONE  50 mg Oral Daily  . zinc sulfate  220 mg Oral Daily   Continuous Infusions: . azithromycin Stopped (01/22/2020 1916)  . cefTRIAXone (ROCEPHIN)  IV Stopped (02/09/2020 1749)  . diltiazem (CARDIZEM) infusion 15 mg/hr (02/11/20 0905)  . lactated ringers 1,000 mL with sodium bicarbonate 100 mEq infusion 50 mL/hr at 02/11/20 0306  . pantoprozole (PROTONIX) infusion 8 mg/hr (02/11/20 [id number])  . remdesivir 100 mg in NS 100 mL       LOS: 3 days     Phillips Climes, MD [facility]  02/11/2020, 11:45 AM   To contact the attending provider between 7A-7P or the covering provider during after hours 7P-7A, please log into the web site [email or web address].

## 2020-02-11 NOTE — Progress Notes (Signed)
Heparin gtt stopped per pharmacist directive.  Will continue to monitor.

## 2020-02-11 DEATH — deceased

## 2020-02-12 ENCOUNTER — Encounter (HOSPITAL_COMMUNITY): Admission: EM | Disposition: E | Payer: Self-pay | Source: Home / Self Care | Attending: Internal Medicine

## 2020-02-12 DIAGNOSIS — R0902 Hypoxemia: Secondary | ICD-10-CM

## 2020-02-12 DIAGNOSIS — I4891 Unspecified atrial fibrillation: Secondary | ICD-10-CM | POA: Diagnosis not present

## 2020-02-12 DIAGNOSIS — K922 Gastrointestinal hemorrhage, unspecified: Secondary | ICD-10-CM | POA: Diagnosis not present

## 2020-02-12 DIAGNOSIS — J9601 Acute respiratory failure with hypoxia: Secondary | ICD-10-CM | POA: Diagnosis not present

## 2020-02-12 DIAGNOSIS — U071 COVID-19: Secondary | ICD-10-CM | POA: Diagnosis not present

## 2020-02-12 LAB — COMPREHENSIVE METABOLIC PANEL
ALT: 30 U/L (ref 0–44)
AST: 27 U/L (ref 15–41)
Albumin: 2.5 g/dL — ABNORMAL LOW (ref 3.5–5.0)
Alkaline Phosphatase: 49 U/L (ref 38–126)
Anion gap: 14 (ref 5–15)
BUN: 21 mg/dL (ref 8–23)
CO2: 28 mmol/L (ref 22–32)
Calcium: 7.9 mg/dL — ABNORMAL LOW (ref 8.9–10.3)
Chloride: 99 mmol/L (ref 98–111)
Creatinine, Ser: 0.8 mg/dL (ref 0.44–1.00)
GFR, Estimated: >60 mL/min (ref >60)
Glucose, Bld: 266 mg/dL — ABNORMAL HIGH (ref 70–99)
Potassium: 3.6 mmol/L (ref 3.5–5.1)
Sodium: 141 mmol/L (ref 135–145)
Total Bilirubin: 0.8 mg/dL (ref 0.3–1.2)
Total Protein: 5.3 g/dL — ABNORMAL LOW (ref 6.5–8.1)

## 2020-02-12 LAB — GLUCOSE, CAPILLARY
Glucose-Capillary: 203 mg/dL — ABNORMAL HIGH (ref 70–99)
Glucose-Capillary: 249 mg/dL — ABNORMAL HIGH (ref 70–99)
Glucose-Capillary: 205 mg/dL — ABNORMAL HIGH (ref 70–99)
Glucose-Capillary: 158 mg/dL — ABNORMAL HIGH (ref 70–99)
Glucose-Capillary: 150 mg/dL — ABNORMAL HIGH (ref 70–99)

## 2020-02-12 LAB — CBC WITH DIFFERENTIAL/PLATELET
Abs Immature Granulocytes: 0.24 10*3/uL — ABNORMAL HIGH (ref 0.00–0.07)
Basophils Absolute: 0 10*3/uL (ref 0.0–0.1)
Basophils Relative: 0 %
Eosinophils Absolute: 0 10*3/uL (ref 0.0–0.5)
Eosinophils Relative: 0 %
HCT: 31.7 % — ABNORMAL LOW (ref 36.0–46.0)
Hemoglobin: 10.6 g/dL — ABNORMAL LOW (ref 12.0–15.0)
Immature Granulocytes: 2 %
Lymphocytes Relative: 4 %
Lymphs Abs: 0.4 10*3/uL — ABNORMAL LOW (ref 0.7–4.0)
MCH: 31.2 pg (ref 26.0–34.0)
MCHC: 33.4 g/dL (ref 30.0–36.0)
MCV: 93.2 fL (ref 80.0–100.0)
Monocytes Absolute: 0.7 10*3/uL (ref 0.1–1.0)
Monocytes Relative: 7 %
Neutro Abs: 8.8 10*3/uL — ABNORMAL HIGH (ref 1.7–7.7)
Neutrophils Relative %: 87 %
Platelets: 235 10*3/uL (ref 150–400)
RBC: 3.4 MIL/uL — ABNORMAL LOW (ref 3.87–5.11)
RDW: 13.4 % (ref 11.5–15.5)
WBC: 10.2 10*3/uL (ref 4.0–10.5)
nRBC: 0 % (ref 0.0–0.2)

## 2020-02-12 LAB — C-REACTIVE PROTEIN: CRP: 2.7 mg/dL — ABNORMAL HIGH (ref <1.0)

## 2020-02-12 LAB — BRAIN NATRIURETIC PEPTIDE: B Natriuretic Peptide: 380.7 pg/mL — ABNORMAL HIGH (ref 0.0–100.0)

## 2020-02-12 LAB — HEPARIN LEVEL (UNFRACTIONATED): Heparin Unfractionated: 0.94 IU/mL — ABNORMAL HIGH (ref 0.30–0.70)

## 2020-02-12 LAB — HEMOGLOBIN A1C
Hgb A1c MFr Bld: 6.2 % — ABNORMAL HIGH (ref 4.8–5.6)
Mean Plasma Glucose: 131.24 mg/dL

## 2020-02-12 LAB — PROCALCITONIN: Procalcitonin: 0.54 ng/mL

## 2020-02-12 LAB — D-DIMER, QUANTITATIVE: D-Dimer, Quant: 1.59 ug/mL-FEU — ABNORMAL HIGH (ref 0.00–0.50)

## 2020-02-12 LAB — MAGNESIUM: Magnesium: 2.2 mg/dL (ref 1.7–2.4)

## 2020-02-12 SURGERY — CANCELLED PROCEDURE

## 2020-02-12 MED ORDER — INSULIN ASPART 100 UNIT/ML ~~LOC~~ SOLN
0.0000 [IU] | SUBCUTANEOUS | Status: DC
  Administered 2020-02-12: 3 [IU] via SUBCUTANEOUS
  Administered 2020-02-12: 2 [IU] via SUBCUTANEOUS
  Administered 2020-02-12: 1 [IU] via SUBCUTANEOUS
  Administered 2020-02-12: 3 [IU] via SUBCUTANEOUS
  Administered 2020-02-13 – 2020-02-15 (×13): 2 [IU] via SUBCUTANEOUS
  Administered 2020-02-15 (×2): 3 [IU] via SUBCUTANEOUS
  Administered 2020-02-15 – 2020-02-16 (×4): 2 [IU] via SUBCUTANEOUS
  Administered 2020-02-16 (×2): 3 [IU] via SUBCUTANEOUS

## 2020-02-12 MED ORDER — DEXMEDETOMIDINE HCL IN NACL 400 MCG/100ML IV SOLN
0.4000 ug/kg/h | INTRAVENOUS | Status: DC
  Administered 2020-02-12: 0.4 ug/kg/h via INTRAVENOUS
  Administered 2020-02-13 (×2): 0.2 ug/kg/h via INTRAVENOUS
  Filled 2020-02-12: qty 200

## 2020-02-12 MED ORDER — SALINE SPRAY 0.65 % NA SOLN
1.0000 | NASAL | Status: DC | PRN
  Administered 2020-02-12 – 2020-02-27 (×2): 1 via NASAL
  Filled 2020-02-12: qty 44

## 2020-02-12 MED ORDER — FUROSEMIDE 10 MG/ML IJ SOLN
40.0000 mg | Freq: Once | INTRAMUSCULAR | Status: AC
  Administered 2020-02-12: 40 mg via INTRAVENOUS
  Filled 2020-02-12: qty 4

## 2020-02-12 SURGICAL SUPPLY — 14 items

## 2020-02-12 NOTE — Consult Note (Signed)
NAME:  Diane Mendoza, MRN:  [id number], DOB:  1945-11-06, LOS: 4 ADMISSION DATE:  02/03/2020, CONSULTATION DATE:  02/12/19 REFERRING MD:  Elgergawy - TRH , CHIEF COMPLAINT:  Worsening Hypoxemia due to COVID-19  Brief History:  75 yo F admitted 12/29 for COVID-19. Progressively incr O2 needs. 02/18/2020 on 50L HHFNC + NRB  History of Present Illness:  75 yo F PMH HTN HLD PVD who tested positive for COVID 12/23 and 12/25. Presented to ED 12/29 for worsening weakness and was admitted to [facility] for management of COVID-19. Started on remdesivir, steroids, and later baricitinib. Found to have new onset Afib and started on dilt gtt. Concern for GIB for which GI was consulted but due to stable H/H no plan for EGD.  Over course of hospitalization pt has had increasing O2 need. On 1/2 pt on 50L HHFNC  + NRB.  PCCM consulted for ICU transfer   Past Medical History:  Arthritis Cervical cancer GERD HLD HTN Palpitations PVD Ventral hernia s/p repair  Significant Hospital Events:  12/29 admitted to [facility]. Started on remdes, steroids. New afib started on dilt gtt 12/30 GI consult for GIB  1/1 started baricitinib  1/2 on 50L HHFNC + NRB transferring to ICU   Consults:  GI PCCM Procedures:    Significant Diagnostic Tests:    Micro Data:  12/23, 12/25, 12/29 COVID positive   Antimicrobials:   Covid therapies: Remdes 12/29>> Steroids 12/29>> Baricitinib 1/1>>  Abx: azithro 12/31>> Rocephin 12/31>>  Interim History / Subjective:  Increasing O2 requirement   Transferring to ICU   Objective   Blood pressure (!) 148/59, pulse 93, temperature 98.3 F (36.8 C), temperature source Axillary, resp. rate 16, height 5\' 5"  (1.651 m), weight 89.4 kg, SpO2 90 %.    FiO2 (%):  [100 %] 100 %   Intake/Output Summary (Last 24 hours) at 02/26/2020 1036 Last data filed at 03/02/2020 0445 Gross per 24 hour  Intake 1263.94 ml  Output 1450 ml  Net -186.06 ml   Filed Weights   01/16/2020 2139  Weight: 89.4  kg    Examination: General: sitting up in bed clearly anxious but able to speak in full sentences however is tachypneic  HENT: ncat, eomi, poor dentition Lungs: diminished but clear Cardiovascular: irreg irreg Abdomen: obese, soft nt nd bs + Extremities: no c/c/e Neuro: awake following commands but clearly anxious GU: deferred  Resolved Hospital Problem list     Assessment & Plan:   Acute hypoxic respiratory failure due to COVID-19 P -transfer to ICU -supplemental O2 for SpO2 > 85 -encourage self prone, pulm hygiene, IS -monitor for need for escalating resp support (NPPV, MV) -is on remdesivir, solumedrol, baricitinib   Afib RVR P -dilt gtt  -started on hep gtt however 1/2 has had hgb drop from 12.9 to 10.6; stopped before ICU transfer  -no overt bleeding noted at this time  Melena, improvbed -hgb had been stable, GI signed off 1/1 without EGD  P -trend H/H -- drop in hgb 1/2 after hep gtt started -protonix  HTN Hold oral agents for now  Hyperglycemia  -starting SSI 1/2   Anxiety:  -start precedex  Best practice (evaluated daily)  Diet: consistent carb Pain/Anxiety/Delirium protocol (if indicated): precedex VAP protocol (if indicated): n/a DVT prophylaxis: scd GI prophylaxis: protonix Glucose control: ssi Mobility: bedrest Disposition:icu  Goals of Care:  Last date of multidisciplinary goals of care discussion:02/29/2020 Family and staff present: RN, self and pt Summary of discussion: full code and would want  son or daughter [name] or kelly to make decisions should she be unable Follow up goals of care discussion due: 1/10 Code Status: full  Labs   CBC: Recent Labs  Lab 01/26/2020 1153 02/09/20 0232 01/17/2020 0342 02/11/20 0442 02/17/2020 0915  WBC 5.7 4.4 6.8 9.4 10.2  NEUTROABS  --  3.8 5.8 8.2* 8.8*  HGB 15.7* 13.2 12.6 12.9 10.6*  HCT 45.4 40.0 36.4 39.4 31.7*  MCV 88.7 92.0 86.9 90.0 93.2  PLT 196 151 203 247 [id number]    Basic Metabolic  Panel: Recent Labs  Lab 01/28/2020 1153 02/09/20 0232 02/09/2020 0342 02/11/20 0442 02/21/2020 0915  NA 130* 130* 136 141 141  K 3.9 4.1 3.7 3.9 3.6  CL 96* 101 101 102 99  CO2 21* 17* 23 28 28   GLUCOSE 172* 177* 177* 186* 266*  BUN 20 22 16 15 21   CREATININE 1.20* 0.95 0.73 0.74 0.80  CALCIUM 8.2* 7.6* 7.9* 7.8* 7.9*  MG  --   --   --  2.2 2.2   GFR: Estimated Creatinine Clearance: 68.2 mL/min (by C-G formula based on SCr of 0.8 mg/dL). Recent Labs  Lab 01/18/2020 2114 02/09/20 0232 02/06/2020 0342 02/11/20 0442 02/29/2020 0915  PROCALCITON 18.67  --   --  2.04  --   WBC  --  4.4 6.8 9.4 10.2  LATICACIDVEN 1.5  --   --   --   --     Liver Function Tests: Recent Labs  Lab 01/29/2020 2114 02/09/20 0232 01/26/2020 0342 02/11/20 0442 03/05/2020 0915  AST 77* 59* 37 33 27  ALT 67* 60* 43 35 30  ALKPHOS 42 41 45 42 49  BILITOT 0.5 0.7 0.5 0.8 0.8  PROT 5.6* 5.4* 5.4* 5.5* 5.3*  ALBUMIN 2.7* 2.8* 2.6* 2.5* 2.5*   Recent Labs  Lab 01/30/2020 1153  LIPASE 29   No results for input(s): AMMONIA in the last 168 hours.  ABG No results found for: PHART, PCO2ART, PO2ART, HCO3, TCO2, ACIDBASEDEF, O2SAT   Coagulation Profile: No results for input(s): INR, PROTIME in the last 168 hours.  Cardiac Enzymes: No results for input(s): CKTOTAL, CKMB, CKMBINDEX, TROPONINI in the last 168 hours.  HbA1C: No results found for: HGBA1C  CBG: Recent Labs  Lab 02/06/2020 1148 02/27/2020 0750  GLUCAP 190* 203*    Review of Systems:   Sob, racing heart, nervous and anxious  Past Medical History:  She,  has a past medical history of Arthritis, Bronchitis, Cancer ([place]), GERD (gastroesophageal reflux disease), Heart palpitations, Hyperlipidemia, Hypertension, Peripheral vascular disease ([place]), and Pneumonia (Jan. 2014).   Surgical History:   Past Surgical History:  Procedure Laterality Date  . ABDOMINAL HYSTERECTOMY  1971  . PR VEIN BYPASS GRAFT,AORTO-FEM-POP  2004   Done at [facility] in [place],  Dr. Gavin Pound  . VENTRAL HERNIA REPAIR  2006   x6 repairs with mesh insertion     Social History:   reports that she quit smoking about 17 years ago. She has never used smokeless tobacco. She reports that she does not drink alcohol and does not use drugs.   Family History:  Her family history includes Cancer in her father and mother; Diabetes in her father; Heart attack in her brother, father, paternal aunt, and paternal uncle; Heart disease in her brother, brother, and father; Stroke in her paternal grandmother.   Allergies Allergies  Allergen Reactions  . Morphine And Related Other (See Comments)    Confusion and aggressive behavior.  . Chromium Rash  .  Lactose Intolerance (Gi) Diarrhea     Home Medications  Prior to Admission medications   Medication Sig Start Date End Date Taking? Authorizing Provider  acetaminophen (TYLENOL) 325 MG tablet Take 650 mg by mouth every 6 (six) hours as needed for mild pain, moderate pain or fever.   Yes [provider]  ibuprofen (ADVIL) 200 MG tablet Take 600 mg by mouth every 8 (eight) hours as needed for mild pain, moderate pain or headache.   Yes [provider]  fluticasone (FLONASE) 50 MCG/ACT nasal spray Place 1-2 sprays into both nostrils daily for 7 days. Patient not taking: Reported on 02/09/2020 11/09/18 11/16/18  Janith Lima, PA-C    Critical care time: The patient is critically ill with multiple organ systems failure and requires high complexity decision making for assessment and support, frequent evaluation and titration of therapies, application of advanced monitoring technologies and extensive interpretation of multiple databases.  Critical care time 38 mins. This represents my time independent of the NPs time taking care of the pt. This is excluding procedures.    Audria Nine DO [facility] 03/02/2020, 11:59 AM

## 2020-02-12 NOTE — Progress Notes (Addendum)
Spoke at length with pt's son Theron Arista and daughter Tresa Endo via phone.   Discussed with them their mothers deterioration and requirement to transition to ICU. I discussed the escalating oxygen requirement and need for precedex. We went over her chronic conditions as well as her new conditions that have occurred while in hospital. The pt is not well and has not been well for >4 weeks perhaps 2/2 to covid or perhaps 2/2 general decline.   I discussed with them the very real possibility of need for intubation, vent support and further decline or even arrest. I expressed great concern over the potential for her to not be able to liberated from ventilator and her continued decline would result in death on the ventilator. I asked them if she had ever discussed this with them as to determine what she would desire for herself.   They are unsure at this time but will discuss. I was able to converse with them about continuing with aggressive care but not intubation or acls should she decline but at that point transitioning to comfort measures and allowing them to be present with her awake and with quality of life over quantity.   They will discuss and call the RN back with decision or come to the hospital to visit prior to end of visiting hours.   They both have recently had covid >10 days ago and are thus able to visit in accordance with the policy.   Advanced care planning 

## 2020-02-12 NOTE — Progress Notes (Signed)
PROGRESS NOTE    Diane Mendoza  VEH:[id number] DOB: 19-Apr-1945 DOA: 01/11/2020 PCP: Pcp, No   Brief Narrative:   Diane Mendoza is a 75 y.o. female with medical history significant of hypertension, hyperlipidemia, PVD, GERD, history of cervical cancer status presenting the ED with complaints of generalized weakness after testing positive for COVID 3 days before admission, she was Febrile with temperature 101.3 F.  Found to be in new onset A. fib with rate up to 130s. SPO2 88% on room air, improved with 2 L supplemental oxygen.  Tachypneic with respiratory rate up to 30s.  Not hypotensive.  WBC 5.7, hemoglobin 15.7, hematocrit 45.4, platelet 196K.  Sodium 130, potassium 3.9, chloride 96, bicarb 21, anion gap 13, BUN 20, creatinine 1.2, glucose 172.  Lipase normal.  FOBT positive. -Patient was started on steroids and Remdesivir, she continues to have progressive oxygen requirement, as well she was in A. fib with RVR, initially anticoagulation has been held given she is Hemoccult positive, by GI, plan for endoscopy 02/11/2020 was canceled given her A. fib with RVR and worsening respiratory status, patient with rapidly increasing oxygen requirement, this a.m. she is on 45-50 L HFNC +15 L NRB, PCCM consulted, she will be transferred to ICU, heparin drip started 02/11/2020, hemoglobin dropped from 12.6-10.6, so it will be stopped, even though there is no evidence of melena.  Subjective: Patient seen and examined.  Patient with worsening oxygen requirement overnight, she does complains worsening shortness of breath and air hunger.  Bowel movement yesterday, with no blood in it.  Assessment & Plan:   Principal Problem:   COVID-19 virus infection Active Problems:   Sepsis (HCC)   Acute hypoxemic respiratory failure (HCC)   GI bleed   Atrial fibrillation with rapid ventricular response (HCC)   Acute hypoxemic respiratory failure secondary to suspected COVID-19 viral pneunia -She is unvaccinated - Continue  remdesivir  -Continue with IV Solu-Medrol . -Patient with worsening hypoxia, and oxygen requirement overnight,chest x-ray with worsening with 19 of pneumonia . -I have discussed baricitinib, with patient, and son, patient with no history of TB, diverticulitis, cancer, immunotherapy, viral hepatitis, procalcitonin significantly trending down, discussed risks and benefits, patient wants to proceed with baricitinib, baricitinib started 02/11/2020. -Was encouraged to use incentive spirometry, flutter valve, out of bed to chair as able . -Some evidence of pleural effusion on chest x-ray, on Lasix as needed, will give another 40 mg of IV Lasix today. -Patient respiratory status is very tenuous, she is high risk for intubation .  PCCM consulted, she will be transferred to ICU. -Continue with IV antibiotics, procalcitonin trending down,18.67>2.04, continue to follow.  Melena/ active GI bleed: - Patient reports having melena for the past 4 weeks.  FOBT positive, however, hemoglobin stable  -GI input greatly appreciated, plan for endoscopy yesterday was aborted given his worsening clinical picture, with A. fib with RVR and worsening hypoxia. -Patient with no significant GI bleed currently, no melena, she had dark brown color stool yesterday, and her significant elevated CHA2DS2-VASc score, and elevated D-dimers, 02/2019 after discussing with GI, especially not be having endoscopy anytime soon given her tenuous respiratory status. -This morning her hemoglobin down 12.9> 10.6, so heparin GTT will be stopped. -Continue with Protonix   New onset A. fib with RVR:  - No documented history of A. Fib -In A. fib with RVR heart rate in the 150s, started on Cardizem drip, loaded with digoxin.  She is on low-dose metoprolol. - CHA2DS2VASc 4 -off heparin GTT given drop  in her hemoglobin  - Echocardiogram ordered.  TSH normal.   Mild hyponatremia:  - resolved  Mild normal anion gap metabolic acidosis:  - Likely  due to diarrhea. On  bicarb drip initially  Mild AKI: Resolved.  Function  Hypertension: -Blood pressures controlled, she is currently on metoprolol and Cardizem drip  Hyperlipidemia -Resume home statin     DVT prophylaxis: SCDs Start: 01/13/2020 2016, heparin GTT stopped this am.   Code Status: Full Code  Family Communication: None present at bedside.  D/W son  12/31.02/11/2020, son and daughter updated 03/12/2020 about worsening clinical condition, and ICU transfer.  Remains inpatient appropriate because:Ongoing diagnostic testing needed not appropriate for outpatient work up   Dispo: The patient is from: Home              Anticipated d/c is to: Home              Anticipated d/c date is: 2 days              Patient currently is not medically stable to d/c.        Estimated body mass index is 32.78 kg/m as calculated from the following:   Height as of this encounter: 5\' 5"  (1.651 m).   Weight as of this encounter: 89.4 kg.              Consultants:   GI  Procedures:   None  Antimicrobials:  Anti-infectives (From admission, onward)   Start     Dose/Rate Route Frequency Ordered Stop   02/11/20 1000  remdesivir 100 mg in sodium chloride 0.9 % 100 mL IVPB        100 mg 200 mL/hr over 30 Minutes Intravenous Daily 01/14/2020 1426 02/14/20 0959   02/02/2020 1730  azithromycin (ZITHROMAX) 500 mg in sodium chloride 0.9 % 250 mL IVPB        500 mg 250 mL/hr over 60 Minutes Intravenous Every 24 hours 01/24/2020 1636     02/08/2020 1730  cefTRIAXone (ROCEPHIN) 2 g in sodium chloride 0.9 % 100 mL IVPB        2 g 200 mL/hr over 30 Minutes Intravenous Every 24 hours 01/25/2020 1636     02/09/20 1000  remdesivir 100 mg in sodium chloride 0.9 % 100 mL IVPB  Status:  Discontinued       "Followed by" Linked Group Details   100 mg 200 mL/hr over 30 Minutes Intravenous Daily 01/15/2020 2020 01/16/2020 1426   01/14/2020 2030  remdesivir 200 mg in sodium chloride 0.9% 250 mL IVPB  Status:   Discontinued       "Followed by" Linked Group Details   200 mg 580 mL/hr over 30 Minutes Intravenous Once 01/30/2020 2020 02/08/2020 1426           Objective: Vitals:   02/26/2020 0300 02/18/2020 0400 02/16/2020 0508 02/11/2020 0751  BP: 123/67 (!) 141/56 139/75 (!) 148/59  Pulse:  82  93  Resp:  20 (!) 22 16  Temp:  98.7 F (37.1 C)  98.3 F (36.8 C)  TempSrc:  Axillary  Axillary  SpO2:  (!) 88% (!) 87% 90%  Weight:      Height:        Intake/Output Summary (Last 24 hours) at 03/05/2020 1030 Last data filed at 02/11/2020 0445 Gross per 24 hour  Intake 1263.94 ml  Output 1450 ml  Net -186.06 ml   Filed Weights   02/04/2020 2139  Weight: 89.4 kg  Examination:  Awake Alert, Oriented X 3, extremely frail, in mild respiratory distress. Symmetrical Chest wall movement, tachypneic with scattered rales. IRR IRR ,No Gallops,Rubs or new Murmurs, No Parasternal Heave +ve B.Sounds, Abd Soft, No tenderness, No rebound - guarding or rigidity. No Cyanosis, Clubbing or edema, No new Rash or bruise     Data Reviewed: I have personally reviewed following labs and imaging studies  CBC: Recent Labs  Lab 01/29/2020 1153 02/09/20 0232 01/21/2020 0342 02/11/20 0442 02/14/2020 0915  WBC 5.7 4.4 6.8 9.4 10.2  NEUTROABS  --  3.8 5.8 8.2* 8.8*  HGB 15.7* 13.2 12.6 12.9 10.6*  HCT 45.4 40.0 36.4 39.4 31.7*  MCV 88.7 92.0 86.9 90.0 93.2  PLT 196 151 203 247 [id number]   Basic Metabolic Panel: Recent Labs  Lab 01/18/2020 1153 02/09/20 0232 01/26/2020 0342 02/11/20 0442  NA 130* 130* 136 141  K 3.9 4.1 3.7 3.9  CL 96* 101 101 102  CO2 21* 17* 23 28  GLUCOSE 172* 177* 177* 186*  BUN 20 22 16 15   CREATININE 1.20* 0.95 0.73 0.74  CALCIUM 8.2* 7.6* 7.9* 7.8*  MG  --   --   --  2.2   GFR: Estimated Creatinine Clearance: 68.2 mL/min (by C-G formula based on SCr of 0.74 mg/dL). Liver Function Tests: Recent Labs  Lab 02/09/2020 2114 02/09/20 0232 02/10/20 0342 02/11/20 0442  AST 77* 59* 37 33  ALT  67* 60* 43 35  ALKPHOS 42 41 45 42  BILITOT 0.5 0.7 0.5 0.8  PROT 5.6* 5.4* 5.4* 5.5*  ALBUMIN 2.7* 2.8* 2.6* 2.5*   Recent Labs  Lab 01/17/2020 1153  LIPASE 29   No results for input(s): AMMONIA in the last 168 hours. Coagulation Profile: No results for input(s): INR, PROTIME in the last 168 hours. Cardiac Enzymes: No results for input(s): CKTOTAL, CKMB, CKMBINDEX, TROPONINI in the last 168 hours. BNP (last 3 results) No results for input(s): PROBNP in the last 8760 hours. HbA1C: No results for input(s): HGBA1C in the last 72 hours. CBG: Recent Labs  Lab 01/24/2020 1148 02/22/2020 0750  GLUCAP 190* 203*   Lipid Profile: No results for input(s): CHOL, HDL, LDLCALC, TRIG, CHOLHDL, LDLDIRECT in the last 72 hours. Thyroid Function Tests: No results for input(s): TSH, T4TOTAL, FREET4, T3FREE, THYROIDAB in the last 72 hours. Anemia Panel: No results for input(s): VITAMINB12, FOLATE, FERRITIN, TIBC, IRON, RETICCTPCT in the last 72 hours. Sepsis Labs: Recent Labs  Lab 01/23/2020 2114 02/11/20 0442  PROCALCITON 18.67 2.04  LATICACIDVEN 1.5  --     Recent Results (from the past 240 hour(s))  Resp Panel by RT-PCR (Flu A&B, Covid) Nasopharyngeal Swab     Status: Abnormal   Collection Time: 01/16/2020  6:28 PM   Specimen: Nasopharyngeal Swab; Nasopharyngeal(NP) swabs in vial transport medium  Result Value Ref Range Status   SARS Coronavirus 2 by RT PCR POSITIVE (A) NEGATIVE Final    Comment: RESULT CALLED TO, READ BACK BY AND VERIFIED WITH: Rick Duff RN 01/13/2020 AT 2213 SK  (NOTE) SARS-CoV-2 target nucleic acids are DETECTED.  The SARS-CoV-2 RNA is generally detectable in upper respiratory specimens during the acute phase of infection. Positive results are indicative of the presence of the identified virus, but do not rule out bacterial infection or co-infection with other pathogens not detected by the test. Clinical correlation with patient history and other diagnostic information  is necessary to determine patient infection status. The expected result is Negative.  Fact Sheet for Patients: EntrepreneurPulse.com.au  Fact Sheet for Healthcare Providers: IncredibleEmployment.be  This test is not yet approved or cleared by the [place] FDA and  has been authorized for detection and/or diagnosis of SARS-CoV-2 by FDA under an Emergency Use Authorization (EUA).  This EUA will remain in effect (meaning this test can be  used) for the duration of  the COVID-19 declaration under Section 564(b)(1) of the Act, 21 U.S.C. section 360bbb-3(b)(1), unless the authorization is terminated or revoked sooner.     Influenza A by PCR NEGATIVE NEGATIVE Final   Influenza B by PCR NEGATIVE NEGATIVE Final    Comment: (NOTE) The Xpert Xpress SARS-CoV-2/FLU/RSV plus assay is intended as an aid in the diagnosis of influenza from Nasopharyngeal swab specimens and should not be used as a sole basis for treatment. Nasal washings and aspirates are unacceptable for Xpert Xpress SARS-CoV-2/FLU/RSV testing.  Fact Sheet for Patients: EntrepreneurPulse.com.au  Fact Sheet for Healthcare Providers: IncredibleEmployment.be  This test is not yet approved or cleared by the [place] FDA and has been authorized for detection and/or diagnosis of SARS-CoV-2 by FDA under an Emergency Use Authorization (EUA). This EUA will remain in effect (meaning this test can be used) for the duration of the COVID-19 declaration under Section 564(b)(1) of the Act, 21 U.S.C. section 360bbb-3(b)(1), unless the authorization is terminated or revoked.  Performed at [facility], [place] [address] [place], [place] [address]   Culture, blood (Routine X 2) w Reflex to ID Panel     Status: None (Preliminary result)   Collection Time: 01/27/2020  9:14 PM   Specimen: BLOOD LEFT ARM  Result Value Ref Range Status   Specimen Description  BLOOD LEFT ARM  Final   Special Requests   Final    BOTTLES DRAWN AEROBIC AND ANAEROBIC Blood Culture adequate volume   Culture   Final    NO GROWTH 4 DAYS Performed at [facility], [place] [address] [place], [place] [address]    Report Status PENDING  Incomplete  Culture, blood (Routine X 2) w Reflex to ID Panel     Status: None (Preliminary result)   Collection Time: 02/09/20  9:07 AM   Specimen: Site Not Specified; Blood  Result Value Ref Range Status   Specimen Description SITE NOT SPECIFIED  Final   Special Requests   Final    BOTTLES DRAWN AEROBIC AND ANAEROBIC Blood Culture results may not be optimal due to an excessive volume of blood received in culture bottles   Culture   Final    NO GROWTH 3 DAYS Performed at [facility], [place] [address], [place], [place] [address]    Report Status PENDING  Incomplete      Radiology Studies: DG Chest Port 1 View  Result Date: 02/11/2020 CLINICAL DATA:  Cough.  COVID positive. EXAM: PORTABLE CHEST 1 VIEW COMPARISON:  February 08, 2020 FINDINGS: The heart size and mediastinal contours are stable. Diffuse increased pulmonary interstitium is identified bilaterally. There is a small right pleural effusion. The visualized skeletal structures are unremarkable. IMPRESSION: Diffuse increased pulmonary interstitium is identified bilaterally with small right pleural effusion. Findings can be seen in pneumonitis or interstitial edema. Electronically Signed   By: Abelardo Diesel M.D.   On: 02/11/2020 09:36   DG Chest Port 1V same Day  Result Date: 02/11/2020 CLINICAL DATA:  PICC line placement EXAM: PORTABLE CHEST 1 VIEW COMPARISON:  Same day chest radiograph FINDINGS: Interval placement of right upper extremity PICC line with distal tip terminating  at the superior cavoatrial junction. Stable heart size. Diffuse bilateral interstitial prominence with patchy right basilar airspace opacity. Small right pleural effusion. No pneumothorax. IMPRESSION:  1. Interval placement of right upper extremity PICC line with distal tip terminating at the superior cavoatrial junction. No pneumothorax. 2. Otherwise stable chest. Electronically Signed   By: Davina Poke D.O.   On: 02/11/2020 13:31   [place] EKG SITE RITE  Result Date: 02/11/2020 If Site Rite image not attached, placement could not be confirmed due to current cardiac rhythm.   Scheduled Meds: . vitamin C  500 mg Oral Daily  . atorvastatin  40 mg Oral Daily  . baricitinib  4 mg Oral Daily  . chlorhexidine  15 mL Mouth Rinse BID  . Chlorhexidine Gluconate Cloth  6 each Topical Daily  . cholecalciferol  1,000 Units Oral Daily  . mouth rinse  15 mL Mouth Rinse q12n4p  . methylPREDNISolone (SOLU-MEDROL) injection  60 mg Intravenous BID  . metoprolol tartrate  12.5 mg Oral BID  . nystatin   Topical TID  . pantoprazole  40 mg Intravenous Q12H  . zinc sulfate  220 mg Oral Daily   Continuous Infusions: . azithromycin Stopped (02/11/20 1850)  . cefTRIAXone (ROCEPHIN)  IV Stopped (02/11/20 1745)  . diltiazem (CARDIZEM) infusion 15 mg/hr (02/25/2020 0839)  . heparin 1,000 Units/hr (02/13/2020 0945)  . remdesivir 100 mg in NS 100 mL 100 mg (02/19/2020 0927)     LOS: 4 days     Phillips Climes, MD [facility]  03/03/2020, 10:30 AM   To contact the attending provider between 7A-7P or the covering provider during after hours 7P-7A, please log into the web site [email or web address].

## 2020-02-13 DIAGNOSIS — U071 COVID-19: Secondary | ICD-10-CM | POA: Diagnosis not present

## 2020-02-13 DIAGNOSIS — J9601 Acute respiratory failure with hypoxia: Secondary | ICD-10-CM | POA: Diagnosis not present

## 2020-02-13 LAB — GLUCOSE, CAPILLARY
Glucose-Capillary: 159 mg/dL — ABNORMAL HIGH (ref 70–99)
Glucose-Capillary: 184 mg/dL — ABNORMAL HIGH (ref 70–99)
Glucose-Capillary: 173 mg/dL — ABNORMAL HIGH (ref 70–99)
Glucose-Capillary: 189 mg/dL — ABNORMAL HIGH (ref 70–99)
Glucose-Capillary: 165 mg/dL — ABNORMAL HIGH (ref 70–99)
Glucose-Capillary: 169 mg/dL — ABNORMAL HIGH (ref 70–99)

## 2020-02-13 LAB — COMPREHENSIVE METABOLIC PANEL
ALT: 27 U/L (ref 0–44)
AST: 24 U/L (ref 15–41)
Albumin: 2.3 g/dL — ABNORMAL LOW (ref 3.5–5.0)
Alkaline Phosphatase: 49 U/L (ref 38–126)
Anion gap: 11 (ref 5–15)
BUN: 31 mg/dL — ABNORMAL HIGH (ref 8–23)
CO2: 29 mmol/L (ref 22–32)
Calcium: 8 mg/dL — ABNORMAL LOW (ref 8.9–10.3)
Chloride: 103 mmol/L (ref 98–111)
Creatinine, Ser: 0.8 mg/dL (ref 0.44–1.00)
GFR, Estimated: >60 mL/min (ref >60)
Glucose, Bld: 177 mg/dL — ABNORMAL HIGH (ref 70–99)
Potassium: 3.9 mmol/L (ref 3.5–5.1)
Sodium: 143 mmol/L (ref 135–145)
Total Bilirubin: 0.8 mg/dL (ref 0.3–1.2)
Total Protein: 5 g/dL — ABNORMAL LOW (ref 6.5–8.1)

## 2020-02-13 LAB — CBC WITH DIFFERENTIAL/PLATELET
Abs Immature Granulocytes: 0.13 10*3/uL — ABNORMAL HIGH (ref 0.00–0.07)
Basophils Absolute: 0 10*3/uL (ref 0.0–0.1)
Basophils Relative: 0 %
Eosinophils Absolute: 0 10*3/uL (ref 0.0–0.5)
Eosinophils Relative: 0 %
HCT: 34.6 % — ABNORMAL LOW (ref 36.0–46.0)
Hemoglobin: 11.7 g/dL — ABNORMAL LOW (ref 12.0–15.0)
Immature Granulocytes: 2 %
Lymphocytes Relative: 6 %
Lymphs Abs: 0.5 10*3/uL — ABNORMAL LOW (ref 0.7–4.0)
MCH: 30.6 pg (ref 26.0–34.0)
MCHC: 33.8 g/dL (ref 30.0–36.0)
MCV: 90.6 fL (ref 80.0–100.0)
Monocytes Absolute: 0.5 10*3/uL (ref 0.1–1.0)
Monocytes Relative: 6 %
Neutro Abs: 7.4 10*3/uL (ref 1.7–7.7)
Neutrophils Relative %: 86 %
Platelets: 224 10*3/uL (ref 150–400)
RBC: 3.82 MIL/uL — ABNORMAL LOW (ref 3.87–5.11)
RDW: 13.3 % (ref 11.5–15.5)
WBC: 8.5 10*3/uL (ref 4.0–10.5)
nRBC: 0 % (ref 0.0–0.2)

## 2020-02-13 LAB — CULTURE, BLOOD (ROUTINE X 2)
Culture: NO GROWTH
Special Requests: ADEQUATE

## 2020-02-13 LAB — D-DIMER, QUANTITATIVE: D-Dimer, Quant: 3.84 ug/mL-FEU — ABNORMAL HIGH (ref 0.00–0.50)

## 2020-02-13 LAB — C-REACTIVE PROTEIN: CRP: 2.6 mg/dL — ABNORMAL HIGH (ref <1.0)

## 2020-02-13 NOTE — Progress Notes (Signed)
Pt taken off bipap and placed on 60L HHFNC but spo2 unable to remain above 80% and pt having increased WOB. Pt placed back on bipap at 750

## 2020-02-13 NOTE — Progress Notes (Signed)
NAME:  Diane Mendoza, MRN:  [id number], DOB:  [birth year], LOS: 5 ADMISSION DATE:  01/25/2020, CONSULTATION DATE:  02/12/19 REFERRING MD:  Elgergawy - TRH , CHIEF COMPLAINT:  Worsening Hypoxemia due to COVID-19  Brief History:  75 yo F admitted 12/29 for COVID-19. Progressively incr O2 needs. March 04, 2020 on 50L HHFNC + NRB  History of Present Illness:  75 yo F PMH HTN HLD PVD who tested positive for COVID 12/23 and 12/25. Presented to ED 12/29 for worsening weakness and was admitted to [facility] for management of COVID-19. Started on remdesivir, steroids, and later baricitinib. Found to have new onset Afib and started on dilt gtt. Concern for GIB for which GI was consulted but due to stable H/H no plan for EGD.  Over course of hospitalization pt has had increasing O2 need. On 1/2 pt on 50L HHFNC  + NRB.  PCCM consulted for ICU transfer   Past Medical History:  Arthritis Cervical cancer GERD HLD HTN Palpitations PVD Ventral hernia s/p repair  Significant Hospital Events:  12/29 admitted to [facility]. Started on remdes, steroids. New afib started on dilt gtt 12/30 GI consult for GIB  1/1 started baricitinib  1/2 on 50L HHFNC + NRB transferring to ICU   Consults:  GI PCCM  Procedures:    Significant Diagnostic Tests:    Micro Data:  12/23, 12/25, 12/29 COVID positive   Antimicrobials:   Covid therapies: Remdes 12/29>> Steroids 12/29>> Baricitinib 1/1>>  Abx: azithro 12/31>> Rocephin 12/31>>  Interim History / Subjective:   Comfortable on BIPAP. Nursing spoke with daughter this AM. I tried calling no answer. GOC discussions planning   Objective   Blood pressure 132/68, pulse 88, temperature 98 F (36.7 C), temperature source Axillary, resp. rate 15, height 5\' 5"  (1.651 m), weight 89.4 kg, SpO2 (!) 89 %.    Vent Mode: BIPAP FiO2 (%):  [100 %] 100 % Set Rate:  [12 bmp] 12 bmp PEEP:  [8 cmH20-14 cmH20] 14 cmH20   Intake/Output Summary (Last 24 hours) at 02/13/2020 0720 Last data  filed at 02/13/2020 0600 Gross per 24 hour  Intake 718.89 ml  Output 750 ml  Net -31.11 ml   Filed Weights   01/13/2020 2139  Weight: 89.4 kg    Examination: General: elderly FM, in bed, on BIPAP  HENT: NCAT, EOMI, poor teeth  Lungs: BL vented breaths  Cardiovascular: irregularly, irregular, s1 s2  Abdomen: obese, s1 s2  Extremities: no edema  Neuro: alert following commands  GU: deferred   Resolved Hospital Problem list     Assessment & Plan:   Acute hypoxic respiratory failure due to COVID-19 P Continue remdesivir, solumedrol, baricitinib  Remains on BIPAP Continue GOC with family planned  Consult placed to palliative care   Afib RVR P - dilt for rate control  - holding heparin at this time   Melena, improvbed -hgb had been stable, GI signed off 1/1 without EGD  P - follow H&H - continue PPI   HTN - holding hold BP meds   Hyperglycemia  - SSI   Anxiety:  - precedex as needed   Best practice (evaluated daily)  Diet: consistent carb Pain/Anxiety/Delirium protocol (if indicated): precedex VAP protocol (if indicated): n/a DVT prophylaxis: scd GI prophylaxis: protonix Glucose control: ssi Mobility: bedrest Disposition:icu  Goals of Care:  Last date of multidisciplinary goals of care discussion: I attempted to call the patients daugther, no answer Family and staff present: RN, self and pt Summary of discussion: full code and  would want son or daughter peter or kelly to make decisions should she be unable Follow up goals of care discussion due: 1/10 Code Status: full  Labs   CBC: Recent Labs  Lab 02/09/20 0232 01/17/2020 0342 02/11/20 0442 03/11/2020 0915 02/13/20 0452  WBC 4.4 6.8 9.4 10.2 8.5  NEUTROABS 3.8 5.8 8.2* 8.8* 7.4  HGB 13.2 12.6 12.9 10.6* 11.7*  HCT 40.0 36.4 39.4 31.7* 34.6*  MCV 92.0 86.9 90.0 93.2 90.6  PLT 151 203 247 235 [id number]    Basic Metabolic Panel: Recent Labs  Lab 02/09/20 0232 01/27/2020 0342 02/11/20 0442  02/28/2020 0915 02/13/20 0401  NA 130* 136 141 141 143  K 4.1 3.7 3.9 3.6 3.9  CL 101 101 102 99 103  CO2 17* 23 28 28 29   GLUCOSE 177* 177* 186* 266* 177*  BUN 22 16 15 21  31*  CREATININE 0.95 0.73 0.74 0.80 0.80  CALCIUM 7.6* 7.9* 7.8* 7.9* 8.0*  MG  --   --  2.2 2.2  --    GFR: Estimated Creatinine Clearance: 68.2 mL/min (by C-G formula based on SCr of 0.8 mg/dL). Recent Labs  Lab 02/06/2020 2114 02/09/20 0232 02/08/2020 0342 02/11/20 0442 02/20/2020 0915 02/13/20 0452  PROCALCITON 18.67  --   --  2.04 0.54  --   WBC  --    < > 6.8 9.4 10.2 8.5  LATICACIDVEN 1.5  --   --   --   --   --    < > = values in this interval not displayed.    Liver Function Tests: Recent Labs  Lab 02/09/20 0232 02/07/2020 0342 02/11/20 0442 03/07/2020 0915 02/13/20 0401  AST 59* 37 33 27 24  ALT 60* 43 35 30 27  ALKPHOS 41 45 42 49 49  BILITOT 0.7 0.5 0.8 0.8 0.8  PROT 5.4* 5.4* 5.5* 5.3* 5.0*  ALBUMIN 2.8* 2.6* 2.5* 2.5* 2.3*   Recent Labs  Lab 02/04/2020 1153  LIPASE 29   No results for input(s): AMMONIA in the last 168 hours.  ABG No results found for: PHART, PCO2ART, PO2ART, HCO3, TCO2, ACIDBASEDEF, O2SAT   Coagulation Profile: No results for input(s): INR, PROTIME in the last 168 hours.  Cardiac Enzymes: No results for input(s): CKTOTAL, CKMB, CKMBINDEX, TROPONINI in the last 168 hours.  HbA1C: Hgb A1c MFr Bld  Date/Time Value Ref Range Status  03/10/2020 09:15 AM 6.2 (H) 4.8 - 5.6 % Final    Comment:    (NOTE) Pre diabetes:          5.7%-6.4%  Diabetes:              >6.4%  Glycemic control for   <7.0% adults with diabetes     CBG: Recent Labs  Lab 02/23/2020 1147 02/11/2020 1726 03/12/2020 1957 03/04/2020 2325 02/13/20 0338  GLUCAP 249* 205* 158* 150* 159*    Review of Systems:   Sob, racing heart, nervous and anxious  Past Medical History:  She,  has a past medical history of Arthritis, Bronchitis, Cancer ([place]), GERD (gastroesophageal reflux disease), Heart  palpitations, Hyperlipidemia, Hypertension, Peripheral vascular disease ([place]), and Pneumonia (Jan. 2014).   Surgical History:   Past Surgical History:  Procedure Laterality Date  . ABDOMINAL HYSTERECTOMY  1971  . PR VEIN BYPASS GRAFT,AORTO-FEM-POP  2004   Done at [facility] in [place],  Dr. Gavin Pound  . VENTRAL HERNIA REPAIR  2006   x6 repairs with mesh insertion     Social History:   reports that  she quit smoking about 17 years ago. She has never used smokeless tobacco. She reports that she does not drink alcohol and does not use drugs.   Family History:  Her family history includes Cancer in her father and mother; Diabetes in her father; Heart attack in her brother, father, paternal aunt, and paternal uncle; Heart disease in her brother, brother, and father; Stroke in her paternal grandmother.   Allergies Allergies  Allergen Reactions  . Morphine And Related Other (See Comments)    Confusion and aggressive behavior.  . Chromium Rash  . Lactose Intolerance (Gi) Diarrhea     Home Medications  Prior to Admission medications   Medication Sig Start Date End Date Taking? Authorizing Provider  acetaminophen (TYLENOL) 325 MG tablet Take 650 mg by mouth every 6 (six) hours as needed for mild pain, moderate pain or fever.   Yes [provider]  ibuprofen (ADVIL) 200 MG tablet Take 600 mg by mouth every 8 (eight) hours as needed for mild pain, moderate pain or headache.   Yes [provider]  fluticasone (FLONASE) 50 MCG/ACT nasal spray Place 1-2 sprays into both nostrils daily for 7 days. Patient not taking: Reported on 02/09/2020 11/09/18 11/16/18  Janith Lima, PA-C     This patient is critically ill with multiple organ system failure; which, requires frequent high complexity decision making, assessment, support, evaluation, and titration of therapies. This was completed through the application of advanced monitoring technologies and extensive interpretation  of multiple databases. During this encounter critical care time was devoted to patient care services described in this note for 32 minutes.  Garner Nash, DO [facility] 02/13/2020 7:21 AM

## 2020-02-13 NOTE — Progress Notes (Signed)
Spoke with patient's son Marshell Garfinkel at bedside, informed about patient's condition. Patient's son requested to keep patient DNI, respecting patient's wishes.  I explained Peter regarding CPR if her heart stops then endotracheal intubation has to be necessary.  Theron Arista stated he would like to speak with his sister Silvestre Gunner to discuss regarding DNR status.  Currently patient will remain partial code with a DO NOT INTUBATE but continue CPR if needed.    Cheri Fowler MD [facility] Pager: [phone] Mobile: [phone]

## 2020-02-13 NOTE — Progress Notes (Signed)
OT Cancellation Note  Patient Details Name: Diane Mendoza MRN: [id number] DOB: [birth year]   Cancelled Treatment:    Reason Eval/Treat Not Completed: Medical issues which prohibited therapy. Medical decline. Pt on 100 % FiO2 BiPap. Pending family discussion regarding plan of care. Will follow as appropriate.   Thornell Mule, OT/L   Acute OT Clinical Specialist Acute Rehabilitation Services Pager [phone] Office [phone]  02/13/2020, 8:45 AM

## 2020-02-13 NOTE — Progress Notes (Signed)
PT Cancellation Note  Patient Details Name: Diane Mendoza MRN: [id number] DOB: [birth year]   Cancelled Treatment:    Reason Eval/Treat Not Completed: Patient not medically ready (pt with medical decline with transition from 5w to ICU on 100% bipap and currently unable to tolerate mobility)   Alanson Hausmann B Kia Varnadore 02/13/2020, 9:37 AM Merryl Hacker, PT Acute Rehabilitation Services Pager: [phone] Office: [phone]

## 2020-02-14 DIAGNOSIS — U071 COVID-19: Secondary | ICD-10-CM | POA: Diagnosis not present

## 2020-02-14 DIAGNOSIS — J9601 Acute respiratory failure with hypoxia: Secondary | ICD-10-CM | POA: Diagnosis not present

## 2020-02-14 LAB — GLUCOSE, CAPILLARY
Glucose-Capillary: 153 mg/dL — ABNORMAL HIGH (ref 70–99)
Glucose-Capillary: 169 mg/dL — ABNORMAL HIGH (ref 70–99)
Glucose-Capillary: 182 mg/dL — ABNORMAL HIGH (ref 70–99)
Glucose-Capillary: 166 mg/dL — ABNORMAL HIGH (ref 70–99)
Glucose-Capillary: 198 mg/dL — ABNORMAL HIGH (ref 70–99)
Glucose-Capillary: 170 mg/dL — ABNORMAL HIGH (ref 70–99)

## 2020-02-14 LAB — CULTURE, BLOOD (ROUTINE X 2): Culture: NO GROWTH

## 2020-02-14 LAB — CBC
HCT: 36 % (ref 36.0–46.0)
Hemoglobin: 12.2 g/dL (ref 12.0–15.0)
MCH: 30.8 pg (ref 26.0–34.0)
MCHC: 33.9 g/dL (ref 30.0–36.0)
MCV: 90.9 fL (ref 80.0–100.0)
Platelets: 205 10*3/uL (ref 150–400)
RBC: 3.96 MIL/uL (ref 3.87–5.11)
RDW: 13.2 % (ref 11.5–15.5)
WBC: 10.6 10*3/uL — ABNORMAL HIGH (ref 4.0–10.5)
nRBC: 0 % (ref 0.0–0.2)

## 2020-02-14 LAB — BASIC METABOLIC PANEL
Anion gap: 13 (ref 5–15)
BUN: 42 mg/dL — ABNORMAL HIGH (ref 8–23)
CO2: 30 mmol/L (ref 22–32)
Calcium: 8 mg/dL — ABNORMAL LOW (ref 8.9–10.3)
Chloride: 103 mmol/L (ref 98–111)
Creatinine, Ser: 0.77 mg/dL (ref 0.44–1.00)
GFR, Estimated: >60 mL/min (ref >60)
Glucose, Bld: 172 mg/dL — ABNORMAL HIGH (ref 70–99)
Potassium: 3.9 mmol/L (ref 3.5–5.1)
Sodium: 146 mmol/L — ABNORMAL HIGH (ref 135–145)

## 2020-02-14 MED ORDER — METOPROLOL TARTRATE 5 MG/5ML IV SOLN
5.0000 mg | Freq: Four times a day (QID) | INTRAVENOUS | Status: DC | PRN
  Administered 2020-02-14: 5 mg via INTRAVENOUS
  Filled 2020-02-14: qty 5

## 2020-02-14 MED ORDER — LABETALOL HCL 5 MG/ML IV SOLN
10.0000 mg | INTRAVENOUS | Status: DC | PRN
  Administered 2020-02-14: 10 mg via INTRAVENOUS
  Administered 2020-02-15: 20 mg via INTRAVENOUS
  Administered 2020-02-15: 10 mg via INTRAVENOUS
  Administered 2020-02-15: 20 mg via INTRAVENOUS
  Administered 2020-02-15: 10 mg via INTRAVENOUS
  Filled 2020-02-14 (×6): qty 4

## 2020-02-14 MED ORDER — POLYETHYLENE GLYCOL 3350 17 G PO PACK
17.0000 g | PACK | Freq: Every day | ORAL | Status: DC
  Administered 2020-02-14: 17 g via ORAL
  Filled 2020-02-14: qty 1

## 2020-02-14 MED ORDER — HYDROXYZINE HCL 25 MG PO TABS
25.0000 mg | ORAL_TABLET | Freq: Three times a day (TID) | ORAL | Status: DC | PRN
  Administered 2020-02-14 – 2020-02-26 (×4): 25 mg via ORAL
  Filled 2020-02-14 (×4): qty 1

## 2020-02-14 NOTE — Progress Notes (Signed)
eLink Physician-Brief Progress Note Patient Name: Diane Mendoza DOB: 1945/11/26 MRN: [id number]   Date of Service  02/14/2020  HPI/Events of Note  Patient with sub-optimal blood pressure control.  eICU Interventions  Lopressor discontinued and PRN labetalol substituted.        Thomasene Lot Diane Mendoza 02/14/2020, 11:08 PM

## 2020-02-14 NOTE — Progress Notes (Signed)
RT note. Attempted to place BIPAP on to sleep tonight. Pt. Became anxious and asked for it off desat to 86, placed HHFNC back on, sat 95%. Pt. Looks more comfortable , VS stable . RT will continue to monitor.

## 2020-02-14 NOTE — Progress Notes (Addendum)
   Referral received for Diane Mendoza :goals of care discussion. Chart reviewed and updates received from RN.   Patient remains on HFNC, family agreed to DNR after discussion with medical team this morning, however would like to continue with medical management. Family is planning to visit patient this afternoon.   PMT will re-attempt to contact family at a later time/date. Detailed note and recommendations to follow once GOC has been completed. (Dr. Tonia Brooms aware)   Thank you for your referral and allowing PMT to assist in [name] care.   Willette Alma, AGPCNP-BC Palliative Medicine Team  Phone: [phone]  No CHARGE   NO CHARGE

## 2020-02-14 NOTE — Progress Notes (Addendum)
NAME:  Diane Mendoza, MRN:  [id number], DOB:  1945/03/30, LOS: 6 ADMISSION DATE:  01/30/2020, CONSULTATION DATE:  02/12/19 REFERRING MD:  Elgergawy - TRH , CHIEF COMPLAINT:  Worsening Hypoxemia due to COVID-19  Brief History:  75 yo F admitted 12/29 for COVID-19. Progressively incr O2 needs. 03/03/2020 on 50L HHFNC + NRB  History of Present Illness:  75 yo F PMH HTN HLD PVD who tested positive for COVID 12/23 and 12/25. Presented to ED 12/29 for worsening weakness and was admitted to [facility] for management of COVID-19. Started on remdesivir, steroids, and later baricitinib. Found to have new onset Afib and started on dilt gtt. Concern for GIB for which GI was consulted but due to stable H/H no plan for EGD.  Over course of hospitalization pt has had increasing O2 need. On 1/2 pt on 50L HHFNC  + NRB.  PCCM consulted for ICU transfer   Past Medical History:  Arthritis Cervical cancer GERD HLD HTN Palpitations PVD Ventral hernia s/p repair  Significant Hospital Events:  12/29 admitted to [facility]. Started on remdes, steroids. New afib started on dilt gtt 12/30 GI consult for GIB  1/1 started baricitinib  1/2 on 50L HHFNC + NRB transferring to ICU   Consults:  GI PCCM  Procedures:    Significant Diagnostic Tests:    Micro Data:  12/23, 12/25, 12/29 COVID positive   Antimicrobials:   Covid therapies: Remdes 12/29>> Steroids 12/29>> Baricitinib 1/1>>  Abx: azithro 12/31 > 1/4  Rocephin 12/31 > 1/4  Interim History / Subjective:  Remained on BiPAP overnight. This AM asking to be taken off to eat breakfast.   Objective   Blood pressure 117/71, pulse (!) 57, temperature (!) 97.2 F (36.2 C), temperature source Axillary, resp. rate (!) 21, height 5\' 5"  (1.651 m), weight 89.4 kg, SpO2 91 %.    Vent Mode: BIPAP FiO2 (%):  [70 %-80 %] 70 % Set Rate:  [12 bmp] 12 bmp PEEP:  [14 cmH20] 14 cmH20   Intake/Output Summary (Last 24 hours) at 02/14/2020 0911 Last data filed at 02/14/2020  0700 Gross per 24 hour  Intake 543.31 ml  Output 675 ml  Net -131.69 ml   Filed Weights   01/30/2020 2139  Weight: 89.4 kg    Examination: General: elderly female, lying in bed, no distress  HENT: dry MM Lungs: coarse, diminished breath sounds   Cardiovascular: irregular, rate controlled, HR 85 Abdomen: obese, non-tender, active bowel sounds   Extremities: no edema  Neuro: alert, oriented x 3, following commands GU: deferred   Resolved Hospital Problem list     Assessment & Plan:   Acute hypoxic respiratory failure due to COVID-19 P Continue remdesivir, solumedrol, baricitinib  BiPAP PRN, Titrate Supplemental oxygen for saturation goal >88 On Azithromycin/Rocephin. Stop date placed for today which will be a 5 day course.   Afib RVR P - remains off Cardizem gtt. Metoprolol PRN for HR >110 - holding heparin at this time   HLD P Continue Lipitor   Melena, improvbed -hgb had been stable, GI signed off 1/1 without EGD  P - follow H&H - continue PPI BID   HTN - holding hold BP meds   Hyperglycemia  - SSI   Anxiety:  - D/C precedex, add PRN atarax   Best practice (evaluated daily)  Diet: consistent carb DVT prophylaxis: scd GI prophylaxis: protonix Glucose control: ssi Mobility: bedrest Disposition:icu  Goals of Care:  Last date of multidisciplinary goals of care discussion:GOC with daughter and  son. Agree to DNR. Want to continue medical care at this time  Family and staff present: RN, self and pt Summary of discussion: DNR would want son or daughter [name] or kelly to make decisions should she be unable Follow up goals of care discussion due: 1/11 Code Status: DNR  Labs   CBC: Recent Labs  Lab 02/09/20 0232 01/29/2020 0342 02/11/20 0442 02/15/2020 0915 02/13/20 0452 02/14/20 0313  WBC 4.4 6.8 9.4 10.2 8.5 10.6*  NEUTROABS 3.8 5.8 8.2* 8.8* 7.4  --   HGB 13.2 12.6 12.9 10.6* 11.7* 12.2  HCT 40.0 36.4 39.4 31.7* 34.6* 36.0  MCV 92.0 86.9 90.0  93.2 90.6 90.9  PLT 151 203 247 235 224 [id number]    Basic Metabolic Panel: Recent Labs  Lab 02/05/2020 0342 02/11/20 0442 02/18/2020 0915 02/13/20 0401 02/14/20 0313  NA 136 141 141 143 146*  K 3.7 3.9 3.6 3.9 3.9  CL 101 102 99 103 103  CO2 23 28 28 29 30   GLUCOSE 177* 186* 266* 177* 172*  BUN 16 15 21  31* 42*  CREATININE 0.73 0.74 0.80 0.80 0.77  CALCIUM 7.9* 7.8* 7.9* 8.0* 8.0*  MG  --  2.2 2.2  --   --    GFR: Estimated Creatinine Clearance: 68.2 mL/min (by C-G formula based on SCr of 0.77 mg/dL). Recent Labs  Lab 01/20/2020 2114 02/09/20 0232 02/11/20 0442 02/25/2020 0915 02/13/20 0452 02/14/20 0313  PROCALCITON 18.67  --  2.04 0.54  --   --   WBC  --    < > 9.4 10.2 8.5 10.6*  LATICACIDVEN 1.5  --   --   --   --   --    < > = values in this interval not displayed.    Liver Function Tests: Recent Labs  Lab 02/09/20 0232 02/09/2020 0342 02/11/20 0442 02/24/2020 0915 02/13/20 0401  AST 59* 37 33 27 24  ALT 60* 43 35 30 27  ALKPHOS 41 45 42 49 49  BILITOT 0.7 0.5 0.8 0.8 0.8  PROT 5.4* 5.4* 5.5* 5.3* 5.0*  ALBUMIN 2.8* 2.6* 2.5* 2.5* 2.3*   Recent Labs  Lab 01/22/2020 1153  LIPASE 29   No results for input(s): AMMONIA in the last 168 hours.  ABG No results found for: PHART, PCO2ART, PO2ART, HCO3, TCO2, ACIDBASEDEF, O2SAT   Coagulation Profile: No results for input(s): INR, PROTIME in the last 168 hours.  Cardiac Enzymes: No results for input(s): CKTOTAL, CKMB, CKMBINDEX, TROPONINI in the last 168 hours.  HbA1C: Hgb A1c MFr Bld  Date/Time Value Ref Range Status  02/29/2020 09:15 AM 6.2 (H) 4.8 - 5.6 % Final    Comment:    (NOTE) Pre diabetes:          5.7%-6.4%  Diabetes:              >6.4%  Glycemic control for   <7.0% adults with diabetes     CBG: Recent Labs  Lab 02/13/20 1524 02/13/20 2002 02/13/20 2310 02/14/20 0311 02/14/20 0735  GLUCAP 189* 165* 169* 153* 169*    Review of Systems:   Sob, racing heart, nervous and anxious  Past  Medical History:  She,  has a past medical history of Arthritis, Bronchitis, Cancer ([place]), GERD (gastroesophageal reflux disease), Heart palpitations, Hyperlipidemia, Hypertension, Peripheral vascular disease ([place]), and Pneumonia (Jan. 2014).   Surgical History:   Past Surgical History:  Procedure Laterality Date  . ABDOMINAL HYSTERECTOMY  1971  . PR VEIN BYPASS GRAFT,AORTO-FEM-POP  2004  Done at [facility] in [place],  Dr. Gavin Pound  . VENTRAL HERNIA REPAIR  2006   x6 repairs with mesh insertion     Social History:   reports that she quit smoking about 17 years ago. She has never used smokeless tobacco. She reports that she does not drink alcohol and does not use drugs.   Family History:  Her family history includes Cancer in her father and mother; Diabetes in her father; Heart attack in her brother, father, paternal aunt, and paternal uncle; Heart disease in her brother, brother, and father; Stroke in her paternal grandmother.   Allergies Allergies  Allergen Reactions  . Morphine And Related Other (See Comments)    Confusion and aggressive behavior.  . Chromium Rash  . Lactose Intolerance (Gi) Diarrhea     Home Medications  Prior to Admission medications   Medication Sig Start Date End Date Taking? Authorizing Provider  acetaminophen (TYLENOL) 325 MG tablet Take 650 mg by mouth every 6 (six) hours as needed for mild pain, moderate pain or fever.   Yes [provider]  ibuprofen (ADVIL) 200 MG tablet Take 600 mg by mouth every 8 (eight) hours as needed for mild pain, moderate pain or headache.   Yes [provider]  fluticasone (FLONASE) 50 MCG/ACT nasal spray Place 1-2 sprays into both nostrils daily for 7 days. Patient not taking: Reported on 02/09/2020 11/09/18 11/16/18  Janith Lima, PA-C     This patient is critically ill with multiple organ system failure; which, requires frequent high complexity decision making, assessment, support, evaluation,  and titration of therapies. This was completed through the application of advanced monitoring technologies and extensive interpretation of multiple databases. During this encounter critical care time was devoted to patient care services described in this note for 32 minutes.  Hayden Pedro, AGACNP-BC  Pulmonary & Critical Care  PCCM Pgr: [phone]   PCCM:  75 yo FM, COVID19 PNA, was on BIPAP, family and patient have agreed for DNR status and no intubation.   BP (!) 125/105   Pulse (!) 110   Temp 98.1 F (36.7 C) (Oral)   Resp 17   Ht 5\' 5"  (1.651 m)   Wt 89.4 kg   SpO2 (!) 72%   BMI 32.78 kg/m   Gen: obese elderly FM, resting in bed HENT: tracking  Heart: irregular, tachy, s1 s2  Lungs: no crackles, no wheeze  Abd: soft, nt nd   Labs reviewed   A:  COVID19 PNA  AHRF  AFIB RVR  HTN Hyperglycemia  Melena  HLD   P: Remdesivir, solumedrol and baricitinib HHFNC If she fails may need to transition to comfort care Holding home bp meds   Transfer to [facility] service.   Garner Nash, DO [facility] 02/14/2020 12:58 PM

## 2020-02-14 NOTE — Progress Notes (Signed)
Notified E-link of elevated BP despite PRN anxiety medication. Administered PRN Metoprolol for elevated HR but it is q6HR medication.

## 2020-02-15 DIAGNOSIS — K922 Gastrointestinal hemorrhage, unspecified: Secondary | ICD-10-CM | POA: Diagnosis not present

## 2020-02-15 DIAGNOSIS — J9601 Acute respiratory failure with hypoxia: Secondary | ICD-10-CM | POA: Diagnosis not present

## 2020-02-15 DIAGNOSIS — U071 COVID-19: Secondary | ICD-10-CM | POA: Diagnosis not present

## 2020-02-15 DIAGNOSIS — I4891 Unspecified atrial fibrillation: Secondary | ICD-10-CM | POA: Diagnosis not present

## 2020-02-15 DIAGNOSIS — J1282 Pneumonia due to coronavirus disease 2019: Secondary | ICD-10-CM

## 2020-02-15 LAB — GLUCOSE, CAPILLARY
Glucose-Capillary: 166 mg/dL — ABNORMAL HIGH (ref 70–99)
Glucose-Capillary: 175 mg/dL — ABNORMAL HIGH (ref 70–99)
Glucose-Capillary: 181 mg/dL — ABNORMAL HIGH (ref 70–99)
Glucose-Capillary: 194 mg/dL — ABNORMAL HIGH (ref 70–99)
Glucose-Capillary: 216 mg/dL — ABNORMAL HIGH (ref 70–99)
Glucose-Capillary: 238 mg/dL — ABNORMAL HIGH (ref 70–99)

## 2020-02-15 LAB — MAGNESIUM: Magnesium: 2.5 mg/dL — ABNORMAL HIGH (ref 1.7–2.4)

## 2020-02-15 LAB — BASIC METABOLIC PANEL
Anion gap: 11 (ref 5–15)
BUN: 29 mg/dL — ABNORMAL HIGH (ref 8–23)
CO2: 28 mmol/L (ref 22–32)
Calcium: 7.8 mg/dL — ABNORMAL LOW (ref 8.9–10.3)
Chloride: 103 mmol/L (ref 98–111)
Creatinine, Ser: 0.7 mg/dL (ref 0.44–1.00)
GFR, Estimated: >60 mL/min (ref >60)
Glucose, Bld: 195 mg/dL — ABNORMAL HIGH (ref 70–99)
Potassium: 3.4 mmol/L — ABNORMAL LOW (ref 3.5–5.1)
Sodium: 142 mmol/L (ref 135–145)

## 2020-02-15 LAB — PHOSPHORUS: Phosphorus: 3.2 mg/dL (ref 2.5–4.6)

## 2020-02-15 MED ORDER — POTASSIUM CHLORIDE CRYS ER 20 MEQ PO TBCR
40.0000 meq | EXTENDED_RELEASE_TABLET | Freq: Once | ORAL | Status: AC
  Administered 2020-02-15: 40 meq via ORAL
  Filled 2020-02-15: qty 2

## 2020-02-15 MED ORDER — DILTIAZEM HCL 60 MG PO TABS
60.0000 mg | ORAL_TABLET | Freq: Four times a day (QID) | ORAL | Status: DC
  Administered 2020-02-15 – 2020-03-01 (×60): 60 mg via ORAL
  Filled 2020-02-15 (×61): qty 1

## 2020-02-15 MED ORDER — ENSURE ENLIVE PO LIQD
237.0000 mL | Freq: Two times a day (BID) | ORAL | Status: DC
  Administered 2020-02-16 – 2020-02-29 (×18): 237 mL via ORAL
  Filled 2020-02-15: qty 237

## 2020-02-15 MED ORDER — FUROSEMIDE 10 MG/ML IJ SOLN
40.0000 mg | Freq: Once | INTRAMUSCULAR | Status: AC
  Administered 2020-02-15: 40 mg via INTRAVENOUS
  Filled 2020-02-15: qty 4

## 2020-02-15 MED ORDER — POLYETHYLENE GLYCOL 3350 17 G PO PACK
17.0000 g | PACK | Freq: Two times a day (BID) | ORAL | Status: DC
  Administered 2020-02-15 – 2020-02-28 (×12): 17 g via ORAL
  Filled 2020-02-15 (×20): qty 1

## 2020-02-15 NOTE — Progress Notes (Signed)
PROGRESS NOTE  Diane Mendoza [id number] DOB: 1945/08/15 DOA: 01/27/2020  PCP: Pcp, No  Brief History/Interval Summary: 75 year old female with past medical history of essential hypertension, peripheral vascular disease, hyperlipidemia who tested positive for Covid on 12/23 presented on 12/29 with worsening weakness.  Was started on steroids Remdesivir and baricitinib.  Subsequently found to have new onset atrial fibrillation and started on Cardizem infusion.  There was also concern for GI bleeding [facility] gastroenterology was consulted.  No endoscopies were performed.  Over the course of the hospitalization patient has had increasing oxygen requirements.  Transferred to the ICU subsequently.  Reason for Visit: Pneumonia due to COVID-19.  Acute respiratory failure with hypoxia  Consultants: Pulmonology.  [facility]  Procedures: None yet  Antibiotics: Anti-infectives (From admission, onward)   Start     Dose/Rate Route Frequency Ordered Stop   02/11/20 1000  remdesivir 100 mg in sodium chloride 0.9 % 100 mL IVPB        100 mg 200 mL/hr over 30 Minutes Intravenous Daily 02/09/2020 1426 02/13/20 0956   01/27/2020 1730  azithromycin (ZITHROMAX) 500 mg in sodium chloride 0.9 % 250 mL IVPB        500 mg 250 mL/hr over 60 Minutes Intravenous Every 24 hours 02/03/2020 1636 02/14/20 1816   01/19/2020 1730  cefTRIAXone (ROCEPHIN) 2 g in sodium chloride 0.9 % 100 mL IVPB        2 g 200 mL/hr over 30 Minutes Intravenous Every 24 hours 01/15/2020 1636 02/14/20 1802   02/09/20 1000  remdesivir 100 mg in sodium chloride 0.9 % 100 mL IVPB  Status:  Discontinued       "Followed by" Linked Group Details   100 mg 200 mL/hr over 30 Minutes Intravenous Daily 01/16/2020 2020 01/23/2020 1426   02/01/2020 2030  remdesivir 200 mg in sodium chloride 0.9% 250 mL IVPB  Status:  Discontinued       "Followed by" Linked Group Details   200 mg 580 mL/hr over 30 Minutes Intravenous Once 02/03/2020 2020 02/06/2020 1426       Subjective/Interval History: Patient not very communicative.  Denies any pain.  Continues to have shortness of breath.     Assessment/Plan:  Acute Hypoxic Resp. Failure/Pneumonia due to COVID-19    Recent Labs  Lab 01/18/2020 2114 02/09/20 0232 01/23/2020 0342 02/11/20 0442 03/12/2020 0915 02/13/20 0401  DDIMER 2.39* 2.40* 2.41* 2.51* 1.59* 3.84*  FERRITIN 2,256*  --   --   --   --   --   CRP 11.8* 10.9* 6.0* 2.7* 2.7* 2.6*  ALT 67* 60* 43 35 30 27  PROCALCITON 18.67  --   --  2.04 0.54  --      Objective findings: Fever: Noted to be afebrile Oxygen requirements: Remains on heated high flow at 50 L/min, 80% FiO2 saturating in the mid to late 80s  COVID 19 Therapeutics: Antibacterials: Was on ceftriaxone and azithromycin.  Completed a 5-day course. Remdesivir: Patient completed course of Remdesivir Steroids: Remains on Solu-Medrol Diuretics: None yet Inhaled Steroids: None yet Baricitinib: Remains on baricitinib started on 1/1 PUD Prophylaxis: On PPI DVT Prophylaxis: SCDs only due to GI bleed  From a respiratory standpoint patient remains tenuous.  Requiring heated high flow.  And has completed course of Remdesivir.  Remains on steroids and baricitinib.  We will give her a dose of furosemide today.  Continue incentive spirometry mobilization.  D-dimer was noted to be elevated couple of days ago.  Inflammatory markers have been  improving.  Patient was on antibacterials due to elevated procalcitonin at 18.67.  Subsequently improved to 0.54.  The treatment plan and use of medications and known side effects were discussed with patient/family. Some of the medications used are based on case reports/anecdotal data.  All other medications being used in the management of COVID-19 based on limited study data.  Complete risks and long-term side effects are unknown, however in the best clinical judgment they seem to be of some benefit.  Patient/family wanted to proceed with treatment  options provided.  Atrial fibrillation with RVR Was requiring a Cardizem infusion.  Subsequently placed on metoprolol as needed.  Heparin on hold due to GI bleed.  Heart rate noted to be poorly controlled this morning.  We will place her on scheduled agents.  TSH noted to be 1.24.  Echocardiogram shows normal systolic function with grade 2 diastolic dysfunction.  No anticoagulation due to concern for GI bleed.  Hyperlipidemia Continue Lipitor.  GI bleed with melanotic stools Gastroenterology was consulted.  No endoscopy was done due to her tenuous respiratory status.  Hemoglobin has been stable.  Continue PPI.  Essential hypertension Blood pressure medications on hold.  Noted to be hypertensive today.  Hyperglycemia HbA1c 6.2.  Monitor CBGs.  Goals of care Conversation held with her daughter and son by critical care medicine.  Palliative care was also following.  Patient changed over to DNR yesterday.  Hypokalemia This will be repleted.  Check magnesium level  Obesity Estimated body mass index is 32.78 kg/m as calculated from the following:   Height as of this encounter: 5\' 5"  (1.651 m).   Weight as of this encounter: 89.4 kg.   DVT Prophylaxis: SCDs Code Status: DNR Family Communication: No family at bedside Disposition Plan: Disposition remains unclear.  PT and OT evaluation.  Status is: Inpatient  Remains inpatient appropriate because:IV treatments appropriate due to intensity of illness or inability to take PO and Inpatient level of care appropriate due to severity of illness   Dispo: The patient is from: Home              Anticipated d/c is to: To be determined              Anticipated d/c date is: 3 days              Patient currently is not medically stable to d/c.      Medications:  Scheduled: . vitamin C  500 mg Oral Daily  . atorvastatin  40 mg Oral Daily  . baricitinib  4 mg Oral Daily  . chlorhexidine  15 mL Mouth Rinse BID  . Chlorhexidine Gluconate  Cloth  6 each Topical Daily  . cholecalciferol  1,000 Units Oral Daily  . feeding supplement  237 mL Oral BID BM  . insulin aspart  0-9 Units Subcutaneous Q4H  . mouth rinse  15 mL Mouth Rinse q12n4p  . methylPREDNISolone (SOLU-MEDROL) injection  60 mg Intravenous BID  . nystatin   Topical TID  . pantoprazole  40 mg Intravenous Q12H  . polyethylene glycol  17 g Oral BID  . zinc sulfate  220 mg Oral Daily   Continuous:  [id number], albuterol, chlorpheniramine-HYDROcodone, guaiFENesin-dextromethorphan, hydrOXYzine, labetalol, ondansetron (ZOFRAN) IV, promethazine, sodium chloride, sodium chloride flush   Objective:  Vital Signs  Vitals:   02/15/20 1000 02/15/20 1100 02/15/20 1110 02/15/20 1200  BP: 139/75 (!) 159/90  (!) 154/74  Pulse: 86 86  88  Resp: (!) 25 (!) 25  (!)  26  Temp:   97.9 F (36.6 C) 97.9 F (36.6 C)  TempSrc:   Oral Oral  SpO2: 92% 95%  (!) 85%  Weight:      Height:        Intake/Output Summary (Last 24 hours) at 02/15/2020 1306 Last data filed at 02/15/2020 0600 Gross per 24 hour  Intake 854.53 ml  Output 720 ml  Net 134.53 ml   Filed Weights   01/23/2020 2139  Weight: 89.4 kg    General appearance: Awake alert.  In no distress Resp: Tachypneic at rest.  Coarse breath sounds bilaterally with crackles at the bases.  No wheezing or rhonchi. Cardio: S1-S2 is tachycardic, irregularly irregular.  No S3-S4.  No rubs murmurs or bruit GI: Abdomen is soft.  Nontender nondistended.  Bowel sounds are present normal.  No masses organomegaly Extremities: No edema.   Neurologic:  No focal neurological deficits.    Lab Results:  Data Reviewed: I have personally reviewed following labs and imaging studies  CBC: Recent Labs  Lab 02/09/20 0232 02/04/2020 0342 02/11/20 0442 03/02/2020 0915 02/13/20 0452 02/14/20 0313  WBC 4.4 6.8 9.4 10.2 8.5 10.6*  NEUTROABS 3.8 5.8 8.2* 8.8* 7.4  --   HGB 13.2 12.6 12.9 10.6* 11.7* 12.2  HCT 40.0 36.4 39.4 31.7* 34.6*  36.0  MCV 92.0 86.9 90.0 93.2 90.6 90.9  PLT 151 203 247 235 224 205    Basic Metabolic Panel: Recent Labs  Lab 02/11/20 0442 02/20/2020 0915 02/13/20 0401 02/14/20 0313 02/15/20 0218  NA 141 141 143 146* 142  K 3.9 3.6 3.9 3.9 3.4*  CL 102 99 103 103 103  CO2 28 28 29 30 28   GLUCOSE 186* 266* 177* 172* 195*  BUN 15 21 31* 42* 29*  CREATININE 0.74 0.80 0.80 0.77 0.70  CALCIUM 7.8* 7.9* 8.0* 8.0* 7.8*  MG 2.2 2.2  --   --  2.5*  PHOS  --   --   --   --  3.2    GFR: Estimated Creatinine Clearance: 68.2 mL/min (by C-G formula based on SCr of 0.7 mg/dL).  Liver Function Tests: Recent Labs  Lab 02/09/20 0232 01/27/2020 0342 02/11/20 0442 02/15/2020 0915 02/13/20 0401  AST 59* 37 33 27 24  ALT 60* 43 35 30 27  ALKPHOS 41 45 42 49 49  BILITOT 0.7 0.5 0.8 0.8 0.8  PROT 5.4* 5.4* 5.5* 5.3* 5.0*  ALBUMIN 2.8* 2.6* 2.5* 2.5* 2.3*    CBG: Recent Labs  Lab 02/14/20 1940 02/14/20 2322 02/15/20 0345 02/15/20 0725 02/15/20 1109  GLUCAP 198* 170* 166* 181* 216*      Recent Results (from the past 240 hour(s))  Resp Panel by RT-PCR (Flu A&B, Covid) Nasopharyngeal Swab     Status: Abnormal   Collection Time: 01/13/2020  6:28 PM   Specimen: Nasopharyngeal Swab; Nasopharyngeal(NP) swabs in vial transport medium  Result Value Ref Range Status   SARS Coronavirus 2 by RT PCR POSITIVE (A) NEGATIVE Final    Comment: RESULT CALLED TO, READ BACK BY AND VERIFIED WITH: 02/09/2020 RN 01/11/2020 AT 2213 SK  (NOTE) SARS-CoV-2 target nucleic acids are DETECTED.  The SARS-CoV-2 RNA is generally detectable in upper respiratory specimens during the acute phase of infection. Positive results are indicative of the presence of the identified virus, but do not rule out bacterial infection or co-infection with other pathogens not detected by the test. Clinical correlation with patient history and other diagnostic information is necessary to determine patient infection  status. The expected result  is Negative.  Fact Sheet for Patients: EntrepreneurPulse.com.au  Fact Sheet for Healthcare Providers: IncredibleEmployment.be  This test is not yet approved or cleared by the [place] FDA and  has been authorized for detection and/or diagnosis of SARS-CoV-2 by FDA under an Emergency Use Authorization (EUA).  This EUA will remain in effect (meaning this test can be  used) for the duration of  the COVID-19 declaration under Section 564(b)(1) of the Act, 21 U.S.C. section 360bbb-3(b)(1), unless the authorization is terminated or revoked sooner.     Influenza A by PCR NEGATIVE NEGATIVE Final   Influenza B by PCR NEGATIVE NEGATIVE Final    Comment: (NOTE) The Xpert Xpress SARS-CoV-2/FLU/RSV plus assay is intended as an aid in the diagnosis of influenza from Nasopharyngeal swab specimens and should not be used as a sole basis for treatment. Nasal washings and aspirates are unacceptable for Xpert Xpress SARS-CoV-2/FLU/RSV testing.  Fact Sheet for Patients: EntrepreneurPulse.com.au  Fact Sheet for Healthcare Providers: IncredibleEmployment.be  This test is not yet approved or cleared by the [place] FDA and has been authorized for detection and/or diagnosis of SARS-CoV-2 by FDA under an Emergency Use Authorization (EUA). This EUA will remain in effect (meaning this test can be used) for the duration of the COVID-19 declaration under Section 564(b)(1) of the Act, 21 U.S.C. section 360bbb-3(b)(1), unless the authorization is terminated or revoked.  Performed at [facility], [place] [address], [place], [place] [address]   Culture, blood (Routine X 2) w Reflex to ID Panel     Status: None   Collection Time: 01/12/2020  9:14 PM   Specimen: BLOOD LEFT ARM  Result Value Ref Range Status   Specimen Description BLOOD LEFT ARM  Final   Special Requests   Final    BOTTLES DRAWN AEROBIC AND ANAEROBIC Blood  Culture adequate volume   Culture   Final    NO GROWTH 5 DAYS Performed at [facility], [place] [address] [place], [place] [address]    Report Status 02/13/2020 FINAL  Final  Culture, blood (Routine X 2) w Reflex to ID Panel     Status: None   Collection Time: 02/09/20  9:07 AM   Specimen: Site Not Specified; Blood  Result Value Ref Range Status   Specimen Description SITE NOT SPECIFIED  Final   Special Requests   Final    BOTTLES DRAWN AEROBIC AND ANAEROBIC Blood Culture results may not be optimal due to an excessive volume of blood received in culture bottles   Culture   Final    NO GROWTH 5 DAYS Performed at [facility], [place] [address] [place], [place] [address]    Report Status 02/14/2020 FINAL  Final      Radiology Studies: No results found.     LOS: 7 days   Vishnu Moeller [employer] on [email or web address]  02/15/2020, 1:06 PM

## 2020-02-15 NOTE — Progress Notes (Signed)
Occupational Therapy Treatment Patient Details Name: Diane Mendoza MRN: [id number] DOB: [birth year] Today's Date: 02/15/2020    History of present illness 75 y.o. female with medical history significant of hypertension, hyperlipidemia, PVD, S/p bil aorto-fem/pop) bypass grafting, GERD, history of cervical cancer COVID unvaccinated  presenting the ED 01/18/2020 with complaints of generalized weakness after testing positive for COVID 3 days ago  symptoms include fevers, chills, cough, shortness of breath, nausea, vomiting, and diarrhea. Admitted for treatment of Sepsis and acute hypoxemic respiratory failure secondary to COVID PNA, Active GI bleed, and new onset of Afib with RVR   OT comments  Pt requiring 40-55L O2 via HHFNC. Pt mod A +2 for SPT to BSC, max A for peri care in standing. Pt continues to desaturate with activity (lowest seen saturation was mid 70's with poor pleth, Pt SOB), Pt requires at least 10 min to recover at increased O2 support after activity (initially on 40LHHFNC and increased to 55 to meet O2 needs and keep them at 88-90 in supine. After resting with PLB and use of incentive spirometer Pt able to return to 50L and maintain saturations around 90%) OT will continue to follow acutely and dc updated to SNF - will continue to monitor pending progress   Follow Up Recommendations  SNF    Equipment Recommendations  3 in 1 bedside commode    Recommendations for Other Services      Precautions / Restrictions Precautions Precautions: Fall Precaution Comments: watch SPo2 Restrictions Weight Bearing Restrictions: No       Mobility Bed Mobility Overal bed mobility: Needs Assistance Bed Mobility: Supine to Sit;Sit to Supine     Supine to sit: Min assist Sit to supine: Min assist   General bed mobility comments: minA for trunk elevation, pt able to bring LEs off EOB, modAx2 for return to supine, modA for LE management, modA for trunk guidiance and line  management  Transfers Overall transfer level: Needs assistance Equipment used: 2 person hand held assist Transfers: Sit to/from [facility] Sit to Stand: Mod assist;+2 physical assistance Stand pivot transfers: Mod assist;+2 physical assistance       General transfer comment: modAx2 for line management, max directional verbal cues as pt with no awareness of lines, verbal cues to sequence stepping, incresaed trunk flexion, SPO2 decreased to low 80s (as low at 79) on 40 LO2, increased to 45 LO2 while on BSC    Balance Overall balance assessment: Needs assistance Sitting-balance support: Feet supported;Bilateral upper extremity supported Sitting balance-Leahy Scale: Fair     Standing balance support: Bilateral upper extremity supported Standing balance-Leahy Scale: Poor Standing balance comment: pt stood x 2 min for hygiene s/p BM, +SOB, SPO2 stayed in low 80s                           ADL either performed or assessed with clinical judgement   ADL Overall ADL's : Needs assistance/impaired     Grooming: Wash/dry hands;Set up;Sitting               Lower Body Dressing: Total assistance;Bed level Lower Body Dressing Details (indicate cue type and reason): Pt would not attempt to don her own socks even with figure 4 method Toilet Transfer: Moderate assistance;+2 for physical assistance;+2 for safety/equipment;Stand-pivot;BSC Toilet Transfer Details (indicate cue type and reason): assist for line management as well as physical assist for transfer [place] and Hygiene: Maximal assistance;+2 for physical assistance;+2 for safety/equipment;Sit to/from stand  Toileting - Clothing Manipulation Details (indicate cue type and reason): assist for rear peri care     Functional mobility during ADLs: Moderate assistance;+2 for physical assistance;+2 for safety/equipment General ADL Comments: continues to desaturate with exertion, not  orthostatic today     Vision       Perception     Praxis      Cognition Arousal/Alertness: Awake/alert Behavior During Therapy: Anxious Overall Cognitive Status: Impaired/Different from baseline Area of Impairment: Safety/judgement;Problem solving                         Safety/Judgement: Decreased awareness of safety   Problem Solving: Slow processing;Difficulty sequencing;Requires verbal cues;Requires tactile cues General Comments: pt mildly impulsive due to anxiety re: work of breathing        Exercises     Shoulder Instructions       General Comments pt on 40LO2 heated HFNC, increased to 45 LO2 for activity, ultimatedly had to increase to 50LO2 to stay around 90SPo2, Pt BP stayed stable 159/90 initially, 155/89 when on commode, HR in 130s during mobility in 110s down to 107 at rest    Pertinent Vitals/ Pain       Pain Assessment: No/denies pain  Home Living                                          Prior Functioning/Environment              Frequency  Min 3X/week        Progress Toward Goals  OT Goals(current goals can now be found in the care plan section)  Progress towards OT goals: Not progressing toward goals - comment (decrease in overall cardiopulmonary status)  Acute Rehab OT Goals Patient Stated Goal: get better OT Goal Formulation: With patient Time For Goal Achievement: 02/25/20 Potential to Achieve Goals: Good  Plan Discharge plan needs to be updated    Co-evaluation    PT/OT/SLP Co-Evaluation/Treatment: Yes Reason for Co-Treatment: Complexity of the patient's impairments (multi-system involvement);For patient/therapist safety;To address functional/ADL transfers PT goals addressed during session: Mobility/safety with mobility;Proper use of DME;Strengthening/ROM OT goals addressed during session: ADL's and self-care;Strengthening/ROM      AM-PAC OT "6 Clicks" Daily Activity     Outcome Measure   Help  from another person eating meals?: A Little Help from another person taking care of personal grooming?: A Little Help from another person toileting, which includes using toliet, bedpan, or urinal?: A Lot Help from another person bathing (including washing, rinsing, drying)?: A Lot Help from another person to put on and taking off regular upper body clothing?: A Lot Help from another person to put on and taking off regular lower body clothing?: Total 6 Click Score: 13    End of Session Equipment Utilized During Treatment: Oxygen (40-55L HHFNC)  OT Visit Diagnosis: Unsteadiness on feet (R26.81);Other abnormalities of gait and mobility (R26.89);Muscle weakness (generalized) (M62.81)   Activity Tolerance Patient tolerated treatment well   Patient Left in bed;with call bell/phone within reach   Nurse Communication Mobility status        Time: 1130-1157 OT Time Calculation (min): 27 min  Charges: OT General Charges $OT Visit: 1 Visit OT Treatments $Self Care/Home Management : 8-22 mins  Nyoka Cowden OTR/L Acute Rehabilitation Services Pager: [phone] Office: [phone]   Evern Bio Karimah Winquist 02/15/2020, 2:12 PM

## 2020-02-15 NOTE — Progress Notes (Signed)
Patient daughter at bedside. Patient belongings such as purse and jewelry (watch and rings) taken home by patients daughter.

## 2020-02-15 NOTE — Progress Notes (Signed)
RT note. Pt. Placed on BIPAP for the night NIV PC 10/14 R12 70%. RT will continue to monitor

## 2020-02-15 NOTE — Progress Notes (Signed)
K 3.5 Creatinine 0.70  GFR >60 Electrolytes replaced per protocol

## 2020-02-15 NOTE — Progress Notes (Signed)
Physical Therapy Treatment Patient Details Name: Diane Mendoza MRN: [id number] DOB: Aug 18, 1945 Today's Date: 02/15/2020    History of Present Illness 75 y.o. female with medical history significant of hypertension, hyperlipidemia, PVD, S/p bil aorto-fem/pop) bypass grafting, GERD, history of cervical cancer COVID unvaccinated  presenting the ED 01/12/2020 with complaints of generalized weakness after testing positive for COVID 3 days ago  symptoms include fevers, chills, cough, shortness of breath, nausea, vomiting, and diarrhea. Admitted for treatment of Sepsis and acute hypoxemic respiratory failure secondary to COVID PNA, Active GI bleed, and new onset of Afib with RVR    PT Comments    Pt requiring 40-50Lo2 via [place] for mobility, sit EOB and std pvt to [facility] and back. Unable to ambulate due to work of breathing and SPO2 being in low 80s. Pt fatigues quickly however does move well, as in able to assist with moving LEs to EOB and will take steps during std pvt transfer however her lungs can't support the activity at this time. Pt dependent for hygiene s/p BM. Acute PT to cont to follow.    Follow Up Recommendations  SNF;Supervision/Assistance - 24 hour (due to O2 requirements, will continue to reassess)     Equipment Recommendations  Rolling walker with 5" wheels    Recommendations for Other Services       Precautions / Restrictions Precautions Precautions: Fall Precaution Comments: watch SPo2 Restrictions Weight Bearing Restrictions: No    Mobility  Bed Mobility Overal bed mobility: Needs Assistance Bed Mobility: Supine to Sit;Sit to Supine     Supine to sit: Min assist Sit to supine: Min assist   General bed mobility comments: minA for trunk elevation, pt able to bring LEs off EOB, modAx2 for return to supine, modA for LE management, modA for trunk guidiance and line management  Transfers Overall transfer level: Needs assistance Equipment used: 2 person hand held  assist Transfers: Sit to/from [employer] Sit to Stand: Mod assist;+2 physical assistance Stand pivot transfers: Mod assist;+2 physical assistance       General transfer comment: modAx2 for line management, max directional verbal cues as pt with no awareness of lines, verbal cues to sequence stepping, incresaed trunk flexion, SPO2 decreased to low 80s (as low at 79) on 40 LO2, increased to 45 LO2 while on [facility]  Ambulation/Gait             General Gait Details: unable due to pulmonary condition and significant O2 requirement for std pvt transfer alone   Stairs             Wheelchair Mobility    Modified Rankin (Stroke Patients Only)       Balance Overall balance assessment: Needs assistance Sitting-balance support: Feet supported;Bilateral upper extremity supported Sitting balance-Leahy Scale: Fair     Standing balance support: Bilateral upper extremity supported Standing balance-Leahy Scale: Poor Standing balance comment: pt stood x 2 min for hygiene s/p BM, +SOB, SPO2 stayed in low 80s                            Cognition Arousal/Alertness: Awake/alert Behavior During Therapy: Anxious Overall Cognitive Status: Impaired/Different from baseline Area of Impairment: Safety/judgement;Problem solving                         Safety/Judgement: Decreased awareness of safety   Problem Solving: Slow processing;Difficulty sequencing;Requires verbal cues;Requires tactile cues General Comments: pt mildly impulsive due to  anxiety re: work of breathing      Exercises      General Comments General comments (skin integrity, edema, etc.): pt on 40LO2 heated HFNC, increased to 51 LO2 for activity, ultimatedly had to increase to 50LO2 to stay around 90SPo2, Pt BP stayed stable 159/90 initially, 155/89 when on commode, HR in 130s during mobility in 110s down to 107 at rest      Pertinent Vitals/Pain Pain Assessment: No/denies pain     Home Living                      Prior Function            PT Goals (current goals can now be found in the care plan section) Progress towards PT goals: Progressing toward goals    Frequency    Min 3X/week      PT Plan Discharge plan needs to be updated    Co-evaluation PT/OT/SLP Co-Evaluation/Treatment: Yes Reason for Co-Treatment: Complexity of the patient's impairments (multi-system involvement) PT goals addressed during session: Mobility/safety with mobility        AM-PAC PT "6 Clicks" Mobility   Outcome Measure  Help needed turning from your back to your side while in a flat bed without using bedrails?: A Little Help needed moving from lying on your back to sitting on the side of a flat bed without using bedrails?: A Little Help needed moving to and from a bed to a chair (including a wheelchair)?: A Lot Help needed standing up from a chair using your arms (e.g., wheelchair or bedside chair)?: A Lot Help needed to walk in hospital room?: A Lot Help needed climbing 3-5 steps with a railing? : Total 6 Click Score: 13    End of Session Equipment Utilized During Treatment: Oxygen Activity Tolerance: Treatment limited secondary to medical complications (Comment) Patient left: in bed;with call bell/phone within reach;with chair alarm set Nurse Communication: Mobility status (SPo2 and O2 requirements) PT Visit Diagnosis: Unsteadiness on feet (R26.81);Other abnormalities of gait and mobility (R26.89);Muscle weakness (generalized) (M62.81);Difficulty in walking, not elsewhere classified (R26.2)     Time: 1130-1157 PT Time Calculation (min) (ACUTE ONLY): 27 min  Charges:  $Therapeutic Activity: 8-22 mins                     Kittie Plater, PT, DPT Acute Rehabilitation Services Pager #: [phone] Office #: [phone]    Berline Lopes 02/15/2020, 1:39 PM

## 2020-02-16 ENCOUNTER — Inpatient Hospital Stay (HOSPITAL_COMMUNITY): Payer: Medicare PPO

## 2020-02-16 DIAGNOSIS — J9601 Acute respiratory failure with hypoxia: Secondary | ICD-10-CM | POA: Diagnosis not present

## 2020-02-16 DIAGNOSIS — I4891 Unspecified atrial fibrillation: Secondary | ICD-10-CM | POA: Diagnosis not present

## 2020-02-16 DIAGNOSIS — U071 COVID-19: Secondary | ICD-10-CM | POA: Diagnosis not present

## 2020-02-16 DIAGNOSIS — K922 Gastrointestinal hemorrhage, unspecified: Secondary | ICD-10-CM | POA: Diagnosis not present

## 2020-02-16 LAB — COMPREHENSIVE METABOLIC PANEL
ALT: 23 U/L (ref 0–44)
AST: 20 U/L (ref 15–41)
Albumin: 2.4 g/dL — ABNORMAL LOW (ref 3.5–5.0)
Alkaline Phosphatase: 54 U/L (ref 38–126)
Anion gap: 9 (ref 5–15)
BUN: 27 mg/dL — ABNORMAL HIGH (ref 8–23)
CO2: 29 mmol/L (ref 22–32)
Calcium: 7.9 mg/dL — ABNORMAL LOW (ref 8.9–10.3)
Chloride: 105 mmol/L (ref 98–111)
Creatinine, Ser: 0.82 mg/dL (ref 0.44–1.00)
GFR, Estimated: >60 mL/min (ref >60)
Glucose, Bld: 214 mg/dL — ABNORMAL HIGH (ref 70–99)
Potassium: 4.1 mmol/L (ref 3.5–5.1)
Sodium: 143 mmol/L (ref 135–145)
Total Bilirubin: 1 mg/dL (ref 0.3–1.2)
Total Protein: 5.1 g/dL — ABNORMAL LOW (ref 6.5–8.1)

## 2020-02-16 LAB — GLUCOSE, CAPILLARY
Glucose-Capillary: 201 mg/dL — ABNORMAL HIGH (ref 70–99)
Glucose-Capillary: 173 mg/dL — ABNORMAL HIGH (ref 70–99)
Glucose-Capillary: 213 mg/dL — ABNORMAL HIGH (ref 70–99)
Glucose-Capillary: 223 mg/dL — ABNORMAL HIGH (ref 70–99)
Glucose-Capillary: 232 mg/dL — ABNORMAL HIGH (ref 70–99)
Glucose-Capillary: 177 mg/dL — ABNORMAL HIGH (ref 70–99)

## 2020-02-16 LAB — MAGNESIUM: Magnesium: 2.5 mg/dL — ABNORMAL HIGH (ref 1.7–2.4)

## 2020-02-16 MED ORDER — MOMETASONE FURO-FORMOTEROL FUM 100-5 MCG/ACT IN AERO
2.0000 | INHALATION_SPRAY | Freq: Two times a day (BID) | RESPIRATORY_TRACT | Status: DC
  Administered 2020-02-16 – 2020-02-29 (×26): 2 via RESPIRATORY_TRACT
  Filled 2020-02-16: qty 8.8

## 2020-02-16 MED ORDER — INSULIN ASPART 100 UNIT/ML ~~LOC~~ SOLN
0.0000 [IU] | SUBCUTANEOUS | Status: DC
  Administered 2020-02-16: 5 [IU] via SUBCUTANEOUS
  Administered 2020-02-16: 3 [IU] via SUBCUTANEOUS
  Administered 2020-02-16: 5 [IU] via SUBCUTANEOUS
  Administered 2020-02-17: 3 [IU] via SUBCUTANEOUS
  Administered 2020-02-17: 5 [IU] via SUBCUTANEOUS
  Administered 2020-02-17 (×3): 3 [IU] via SUBCUTANEOUS
  Administered 2020-02-18 (×2): 5 [IU] via SUBCUTANEOUS
  Administered 2020-02-18: 2 [IU] via SUBCUTANEOUS
  Administered 2020-02-18 (×3): 3 [IU] via SUBCUTANEOUS
  Administered 2020-02-19 (×3): 2 [IU] via SUBCUTANEOUS
  Administered 2020-02-19 – 2020-02-20 (×5): 3 [IU] via SUBCUTANEOUS
  Administered 2020-02-20: 2 [IU] via SUBCUTANEOUS
  Administered 2020-02-20 (×3): 3 [IU] via SUBCUTANEOUS
  Administered 2020-02-21: 2 [IU] via SUBCUTANEOUS
  Administered 2020-02-21: 3 [IU] via SUBCUTANEOUS
  Administered 2020-02-21: 2 [IU] via SUBCUTANEOUS
  Administered 2020-02-21 (×3): 3 [IU] via SUBCUTANEOUS
  Administered 2020-02-22: 2 [IU] via SUBCUTANEOUS
  Administered 2020-02-22: 3 [IU] via SUBCUTANEOUS
  Administered 2020-02-22: 5 [IU] via SUBCUTANEOUS
  Administered 2020-02-22: 2 [IU] via SUBCUTANEOUS
  Administered 2020-02-22: 3 [IU] via SUBCUTANEOUS
  Administered 2020-02-22 – 2020-02-23 (×2): 2 [IU] via SUBCUTANEOUS
  Administered 2020-02-23: 3 [IU] via SUBCUTANEOUS
  Administered 2020-02-23: 2 [IU] via SUBCUTANEOUS
  Administered 2020-02-23: 3 [IU] via SUBCUTANEOUS
  Administered 2020-02-23: 5 [IU] via SUBCUTANEOUS
  Administered 2020-02-23 – 2020-02-24 (×2): 3 [IU] via SUBCUTANEOUS
  Administered 2020-02-24: 5 [IU] via SUBCUTANEOUS
  Administered 2020-02-24: 3 [IU] via SUBCUTANEOUS
  Administered 2020-02-24: 2 [IU] via SUBCUTANEOUS
  Administered 2020-02-24: 5 [IU] via SUBCUTANEOUS
  Administered 2020-02-25: 2 [IU] via SUBCUTANEOUS
  Administered 2020-02-25: 3 [IU] via SUBCUTANEOUS
  Administered 2020-02-25 – 2020-02-26 (×3): 2 [IU] via SUBCUTANEOUS

## 2020-02-16 MED ORDER — FUROSEMIDE 10 MG/ML IJ SOLN
40.0000 mg | Freq: Two times a day (BID) | INTRAMUSCULAR | Status: AC
  Administered 2020-02-16 (×2): 40 mg via INTRAVENOUS
  Filled 2020-02-16 (×2): qty 4

## 2020-02-16 MED ORDER — METHYLPREDNISOLONE SODIUM SUCC 40 MG IJ SOLR
40.0000 mg | Freq: Two times a day (BID) | INTRAMUSCULAR | Status: DC
  Administered 2020-02-16 – 2020-02-23 (×14): 40 mg via INTRAVENOUS
  Filled 2020-02-16 (×14): qty 1

## 2020-02-16 NOTE — Progress Notes (Signed)
PROGRESS NOTE  Diane Mendoza [id number] DOB: [birth year] DOA: 01/31/2020  PCP: Pcp, No  Brief History/Interval Summary: 75 year old female with past medical history of essential hypertension, peripheral vascular disease, hyperlipidemia who tested positive for Covid on 12/23 presented on 12/29 with worsening weakness.  Was started on steroids Remdesivir and baricitinib.  Subsequently found to have new onset atrial fibrillation and started on Cardizem infusion.  There was also concern for GI bleeding [facility] gastroenterology was consulted.  No endoscopies were performed.  Over the course of the hospitalization patient has had increasing oxygen requirements.  Transferred to the ICU subsequently.  Reason for Visit: Pneumonia due to COVID-19.  Acute respiratory failure with hypoxia  Consultants: Pulmonology.  [facility]  Procedures: None yet  Antibiotics: Anti-infectives (From admission, onward)   Start     Dose/Rate Route Frequency Ordered Stop   02/11/20 1000  remdesivir 100 mg in sodium chloride 0.9 % 100 mL IVPB        100 mg 200 mL/hr over 30 Minutes Intravenous Daily 01/15/2020 1426 02/13/20 0956   01/12/2020 1730  azithromycin (ZITHROMAX) 500 mg in sodium chloride 0.9 % 250 mL IVPB        500 mg 250 mL/hr over 60 Minutes Intravenous Every 24 hours 01/27/2020 1636 02/14/20 1816   01/11/2020 1730  cefTRIAXone (ROCEPHIN) 2 g in sodium chloride 0.9 % 100 mL IVPB        2 g 200 mL/hr over 30 Minutes Intravenous Every 24 hours 01/22/2020 1636 02/14/20 1802   02/09/20 1000  remdesivir 100 mg in sodium chloride 0.9 % 100 mL IVPB  Status:  Discontinued       "Followed by" Linked Group Details   100 mg 200 mL/hr over 30 Minutes Intravenous Daily 01/20/2020 2020 01/24/2020 1426   02/09/2020 2030  remdesivir 200 mg in sodium chloride 0.9% 250 mL IVPB  Status:  Discontinued       "Followed by" Linked Group Details   200 mg 580 mL/hr over 30 Minutes Intravenous Once 01/24/2020 2020 02/05/2020 1426       Subjective/Interval History: Patient not very communicative.  Denies any chest pain.  Continues to have some difficulty breathing but she mentions that she is feeling better compared to yesterday.  No nausea or vomiting.     Assessment/Plan:  Acute Hypoxic Resp. Failure/Pneumonia due to COVID-19    Recent Labs  Lab 01/11/2020 0342 02/11/20 0442 02/29/2020 0915 02/13/20 0401 02/16/20 0344  DDIMER 2.41* 2.51* 1.59* 3.84*  --   CRP 6.0* 2.7* 2.7* 2.6*  --   ALT 43 35 30 27 23   PROCALCITON  --  2.04 0.54  --   --      Objective findings: Oxygen requirements: Remains on heated high flow nasal cannula at 50 L/min, 80% FiO2.  Saturations noted to be in the late 80s to early 90s.    COVID 19 Therapeutics: Antibacterials: Was on ceftriaxone and azithromycin.  Completed a 5-day course. Remdesivir: Patient completed course of Remdesivir Steroids: Remains on Solu-Medrol Diuretics: None yet Inhaled Steroids: Will initiate Baricitinib: Remains on baricitinib started on 1/1 PUD Prophylaxis: On PPI DVT Prophylaxis: SCDs only due to GI bleed  From a respiratory standpoint patient remains tenuous.  Continues to require heated high flow.  She has completed course of Remdesivir.  Remains on steroids and baricitinib.  Give additional dose of furosemide today.  Continue incentive spirometry.  Mobilize as much as possible.  Prone positioning as much as possible.  Patient also completed  a course of antibacterials.  Chest x-ray was done this morning which shows improvement in bilateral infiltrates.  The treatment plan and use of medications and known side effects were discussed with patient/family. Some of the medications used are based on case reports/anecdotal data.  All other medications being used in the management of COVID-19 based on limited study data.  Complete risks and long-term side effects are unknown, however in the best clinical judgment they seem to be of some benefit.   Patient/family wanted to proceed with treatment options provided.  Atrial fibrillation with RVR Was requiring a Cardizem infusion.  Subsequently placed on metoprolol as needed.  Heparin on hold due to GI bleed.   Heart rate was poorly controlled yesterday.  Started on scheduled diltiazem with improvement.  Noted to be slightly tachycardic this morning.  We will continue to see trends over the next 24 hours before making any dose adjustments.   TSH noted to be 1.24.  Echocardiogram shows normal systolic function with grade 2 diastolic dysfunction.  No anticoagulation due to concern for GI bleed.  Hyperlipidemia Continue Lipitor.  GI bleed with melanotic stools Gastroenterology was consulted.  No endoscopy was done due to her tenuous respiratory status.  Hemoglobin has been stable.  Continue PPI.  Essential hypertension Monitor blood pressure..  Patient on diltiazem.  Hyperglycemia HbA1c 6.2.  CBGs noted to be elevated.  Initiate sliding scale coverage.  Goals of care Conversation held with her daughter and son by critical care medicine.  Palliative care was also following.  Patient changed to DNR.  Hypokalemia Repleted.  Magnesium 2.5.  Obesity Estimated body mass index is 32.78 kg/m as calculated from the following:   Height as of this encounter: 5\' 5"  (1.651 m).   Weight as of this encounter: 89.4 kg.   DVT Prophylaxis: SCDs Code Status: DNR Family Communication: No family at bedside.  Call family later today. Disposition Plan: Disposition remains unclear.  PT and OT evaluation.  Status is: Inpatient  Remains inpatient appropriate because:IV treatments appropriate due to intensity of illness or inability to take PO and Inpatient level of care appropriate due to severity of illness   Dispo: The patient is from: Home              Anticipated d/c is to: To be determined              Anticipated d/c date is: 3 days              Patient currently is not medically stable to  d/c.      Medications:  Scheduled: . vitamin C  500 mg Oral Daily  . atorvastatin  40 mg Oral Daily  . baricitinib  4 mg Oral Daily  . chlorhexidine  15 mL Mouth Rinse BID  . Chlorhexidine Gluconate Cloth  6 each Topical Daily  . cholecalciferol  1,000 Units Oral Daily  . diltiazem  60 mg Oral Q6H  . feeding supplement  237 mL Oral BID BM  . furosemide  40 mg Intravenous Q12H  . insulin aspart  0-9 Units Subcutaneous Q4H  . mouth rinse  15 mL Mouth Rinse q12n4p  . methylPREDNISolone (SOLU-MEDROL) injection  60 mg Intravenous BID  . nystatin   Topical TID  . pantoprazole  40 mg Intravenous Q12H  . polyethylene glycol  17 g Oral BID  . zinc sulfate  220 mg Oral Daily   Continuous:  [id number], albuterol, chlorpheniramine-HYDROcodone, guaiFENesin-dextromethorphan, hydrOXYzine, labetalol, ondansetron (ZOFRAN) IV, promethazine, sodium chloride,  sodium chloride flush   Objective:  Vital Signs  Vitals:   02/16/20 0839 02/16/20 0900 02/16/20 1000 02/16/20 1103  BP:  (!) 158/62 (!) 166/94   Pulse: (!) 110 89 91   Resp: (!) 24 (!) 24 (!) 23   Temp:    97.9 F (36.6 C)  TempSrc:    Oral  SpO2: (!) 87% 90% 91%   Weight:      Height:        Intake/Output Summary (Last 24 hours) at 02/16/2020 1122 Last data filed at 02/16/2020 1100 Gross per 24 hour  Intake 437 ml  Output 1425 ml  Net -988 ml   Filed Weights   01/14/2020 2139  Weight: 89.4 kg    General appearance: Awake alert.  In no distress.  Distracted Resp: Tachypneic at rest.  No use of accessory muscles.  Crackles in the bases bilaterally.  No wheezing or rhonchi.   Cardio: S1-S2 is tachycardic, irregularly irregular.  No S3-S4.  No rubs murmurs or bruit GI: Abdomen is soft.  Nontender nondistended.  Bowel sounds are present normal.  No masses organomegaly Extremities: No edema.  Moving all of her extremities Neurologic: No focal neurological deficits.     Lab Results:  Data Reviewed: I have personally  reviewed following labs and imaging studies  CBC: Recent Labs  Lab 01/31/2020 0342 02/11/20 0442 02/14/2020 0915 02/13/20 0452 02/14/20 0313  WBC 6.8 9.4 10.2 8.5 10.6*  NEUTROABS 5.8 8.2* 8.8* 7.4  --   HGB 12.6 12.9 10.6* 11.7* 12.2  HCT 36.4 39.4 31.7* 34.6* 36.0  MCV 86.9 90.0 93.2 90.6 90.9  PLT 203 247 235 224 [id number]    Basic Metabolic Panel: Recent Labs  Lab 02/11/20 0442 03/12/2020 0915 02/13/20 0401 02/14/20 0313 02/15/20 0218 02/16/20 0344  NA 141 141 143 146* 142 143  K 3.9 3.6 3.9 3.9 3.4* 4.1  CL 102 99 103 103 103 105  CO2 28 28 29 30 28 29   GLUCOSE 186* 266* 177* 172* 195* 214*  BUN 15 21 31* 42* 29* 27*  CREATININE 0.74 0.80 0.80 0.77 0.70 0.82  CALCIUM 7.8* 7.9* 8.0* 8.0* 7.8* 7.9*  MG 2.2 2.2  --   --  2.5* 2.5*  PHOS  --   --   --   --  3.2  --     GFR: Estimated Creatinine Clearance: 66.5 mL/min (by C-G formula based on SCr of 0.82 mg/dL).  Liver Function Tests: Recent Labs  Lab 01/16/2020 0342 02/11/20 0442 02/19/2020 0915 02/13/20 0401 02/16/20 0344  AST 37 33 27 24 20   ALT 43 35 30 27 23   ALKPHOS 45 42 49 49 54  BILITOT 0.5 0.8 0.8 0.8 1.0  PROT 5.4* 5.5* 5.3* 5.0* 5.1*  ALBUMIN 2.6* 2.5* 2.5* 2.3* 2.4*    CBG: Recent Labs  Lab 02/15/20 1957 02/15/20 2338 02/16/20 0348 02/16/20 0722 02/16/20 1102  GLUCAP 194* 175* 201* 173* 213*      Recent Results (from the past 240 hour(s))  Resp Panel by RT-PCR (Flu A&B, Covid) Nasopharyngeal Swab     Status: Abnormal   Collection Time: 01/24/2020  6:28 PM   Specimen: Nasopharyngeal Swab; Nasopharyngeal(NP) swabs in vial transport medium  Result Value Ref Range Status   SARS Coronavirus 2 by RT PCR POSITIVE (A) NEGATIVE Final    Comment: RESULT CALLED TO, READ BACK BY AND VERIFIED WITH: Rick Duff RN 01/29/2020 AT 2213 SK  (NOTE) SARS-CoV-2 target nucleic acids are DETECTED.  The SARS-CoV-2  RNA is generally detectable in upper respiratory specimens during the acute phase of infection. Positive  results are indicative of the presence of the identified virus, but do not rule out bacterial infection or co-infection with other pathogens not detected by the test. Clinical correlation with patient history and other diagnostic information is necessary to determine patient infection status. The expected result is Negative.  Fact Sheet for Patients: BloggerCourse.com  Fact Sheet for Healthcare Providers: SeriousBroker.it  This test is not yet approved or cleared by the [place] FDA and  has been authorized for detection and/or diagnosis of SARS-CoV-2 by FDA under an Emergency Use Authorization (EUA).  This EUA will remain in effect (meaning this test can be  used) for the duration of  the COVID-19 declaration under Section 564(b)(1) of the Act, 21 U.S.C. section 360bbb-3(b)(1), unless the authorization is terminated or revoked sooner.     Influenza A by PCR NEGATIVE NEGATIVE Final   Influenza B by PCR NEGATIVE NEGATIVE Final    Comment: (NOTE) The Xpert Xpress SARS-CoV-2/FLU/RSV plus assay is intended as an aid in the diagnosis of influenza from Nasopharyngeal swab specimens and should not be used as a sole basis for treatment. Nasal washings and aspirates are unacceptable for Xpert Xpress SARS-CoV-2/FLU/RSV testing.  Fact Sheet for Patients: BloggerCourse.com  Fact Sheet for Healthcare Providers: SeriousBroker.it  This test is not yet approved or cleared by the [place] FDA and has been authorized for detection and/or diagnosis of SARS-CoV-2 by FDA under an Emergency Use Authorization (EUA). This EUA will remain in effect (meaning this test can be used) for the duration of the COVID-19 declaration under Section 564(b)(1) of the Act, 21 U.S.C. section 360bbb-3(b)(1), unless the authorization is terminated or revoked.  Performed at [facility], [address], [place], [place] [address]   Culture, blood (Routine X 2) w Reflex to ID Panel     Status: None   Collection Time: 01/25/2020  9:14 PM   Specimen: BLOOD LEFT ARM  Result Value Ref Range Status   Specimen Description BLOOD LEFT ARM  Final   Special Requests   Final    BOTTLES DRAWN AEROBIC AND ANAEROBIC Blood Culture adequate volume   Culture   Final    NO GROWTH 5 DAYS Performed at [facility], [address] [place], [place] [address]    Report Status 02/13/2020 FINAL  Final  Culture, blood (Routine X 2) w Reflex to ID Panel     Status: None   Collection Time: 02/09/20  9:07 AM   Specimen: Site Not Specified; Blood  Result Value Ref Range Status   Specimen Description SITE NOT SPECIFIED  Final   Special Requests   Final    BOTTLES DRAWN AEROBIC AND ANAEROBIC Blood Culture results may not be optimal due to an excessive volume of blood received in culture bottles   Culture   Final    NO GROWTH 5 DAYS Performed at [facility], [address] [place], [place] [address]    Report Status 02/14/2020 FINAL  Final      Radiology Studies: DG Chest Port 1 View  Result Date: 02/16/2020 CLINICAL DATA:  COVID pneumonia EXAM: PORTABLE CHEST 1 VIEW COMPARISON:  02/11/2020 FINDINGS: Pulmonary insufflation has decreased and lung volumes are small, though symmetric. Superimposed pulmonary infiltrates appear improved, particularly within the perihilar and right lower lung zone. No pneumothorax. Tiny bilateral pleural effusions may be present. Cardiac size within normal limits. No acute  bone abnormality. Right upper extremity PICC line tip is seen at the superior cavoatrial junction. IMPRESSION: Improving multifocal pulmonary infiltrates, likely infectious. Diminishing pulmonary insufflation. Electronically Signed   By: Helyn Numbers MD   On: 02/16/2020 05:17       LOS: 8 days   Osvaldo Shipper  [facility] Pager on [email or web address]  02/16/2020, 11:22 AM

## 2020-02-17 DIAGNOSIS — K922 Gastrointestinal hemorrhage, unspecified: Secondary | ICD-10-CM | POA: Diagnosis not present

## 2020-02-17 DIAGNOSIS — I4891 Unspecified atrial fibrillation: Secondary | ICD-10-CM | POA: Diagnosis not present

## 2020-02-17 DIAGNOSIS — U071 COVID-19: Secondary | ICD-10-CM | POA: Diagnosis not present

## 2020-02-17 DIAGNOSIS — J9601 Acute respiratory failure with hypoxia: Secondary | ICD-10-CM | POA: Diagnosis not present

## 2020-02-17 LAB — CBC
HCT: 35.3 % — ABNORMAL LOW (ref 36.0–46.0)
Hemoglobin: 12 g/dL (ref 12.0–15.0)
MCH: 30.5 pg (ref 26.0–34.0)
MCHC: 34 g/dL (ref 30.0–36.0)
MCV: 89.8 fL (ref 80.0–100.0)
Platelets: 192 10*3/uL (ref 150–400)
RBC: 3.93 MIL/uL (ref 3.87–5.11)
RDW: 13.1 % (ref 11.5–15.5)
WBC: 16.4 10*3/uL — ABNORMAL HIGH (ref 4.0–10.5)
nRBC: 0 % (ref 0.0–0.2)

## 2020-02-17 LAB — GLUCOSE, CAPILLARY
Glucose-Capillary: 177 mg/dL — ABNORMAL HIGH (ref 70–99)
Glucose-Capillary: 224 mg/dL — ABNORMAL HIGH (ref 70–99)
Glucose-Capillary: 184 mg/dL — ABNORMAL HIGH (ref 70–99)
Glucose-Capillary: 195 mg/dL — ABNORMAL HIGH (ref 70–99)

## 2020-02-17 LAB — COMPREHENSIVE METABOLIC PANEL
ALT: 24 U/L (ref 0–44)
AST: 21 U/L (ref 15–41)
Albumin: 2.4 g/dL — ABNORMAL LOW (ref 3.5–5.0)
Alkaline Phosphatase: 55 U/L (ref 38–126)
Anion gap: 10 (ref 5–15)
BUN: 30 mg/dL — ABNORMAL HIGH (ref 8–23)
CO2: 30 mmol/L (ref 22–32)
Calcium: 7.8 mg/dL — ABNORMAL LOW (ref 8.9–10.3)
Chloride: 103 mmol/L (ref 98–111)
Creatinine, Ser: 0.83 mg/dL (ref 0.44–1.00)
GFR, Estimated: >60 mL/min (ref >60)
Glucose, Bld: 214 mg/dL — ABNORMAL HIGH (ref 70–99)
Potassium: 3.8 mmol/L (ref 3.5–5.1)
Sodium: 143 mmol/L (ref 135–145)
Total Bilirubin: 1 mg/dL (ref 0.3–1.2)
Total Protein: 5 g/dL — ABNORMAL LOW (ref 6.5–8.1)

## 2020-02-17 MED ORDER — INSULIN GLARGINE 100 UNIT/ML ~~LOC~~ SOLN
8.0000 [IU] | Freq: Every day | SUBCUTANEOUS | Status: DC
  Administered 2020-02-17 – 2020-02-26 (×10): 8 [IU] via SUBCUTANEOUS
  Filled 2020-02-17 (×11): qty 0.08

## 2020-02-17 MED ORDER — FUROSEMIDE 10 MG/ML IJ SOLN
40.0000 mg | Freq: Once | INTRAMUSCULAR | Status: AC
  Administered 2020-02-17: 40 mg via INTRAVENOUS
  Filled 2020-02-17: qty 4

## 2020-02-17 MED ORDER — POTASSIUM CHLORIDE CRYS ER 20 MEQ PO TBCR
40.0000 meq | EXTENDED_RELEASE_TABLET | Freq: Once | ORAL | Status: AC
  Administered 2020-02-17: 40 meq via ORAL
  Filled 2020-02-17: qty 2

## 2020-02-17 NOTE — Progress Notes (Signed)
PROGRESS NOTE  Diane Mendoza [id number] DOB: Dec 07, 1945 DOA: 02/07/2020  PCP: Pcp, No  Brief History/Interval Summary: 75 year old female with past medical history of essential hypertension, peripheral vascular disease, hyperlipidemia who tested positive for Covid on 12/23 presented on 12/29 with worsening weakness.  Was started on steroids Remdesivir and baricitinib.  Subsequently found to have new onset atrial fibrillation and started on Cardizem infusion.  There was also concern for GI bleeding Far [facility] gastroenterology was consulted.  No endoscopies were performed.  Over the course of the hospitalization patient has had increasing oxygen requirements.  Transferred to the ICU subsequently.  Reason for Visit: Pneumonia due to COVID-19.  Acute respiratory failure with hypoxia  Consultants: Pulmonology.  [facility]  Procedures: None yet  Antibiotics: Anti-infectives (From admission, onward)   Start     Dose/Rate Route Frequency Ordered Stop   02/11/20 1000  remdesivir 100 mg in sodium chloride 0.9 % 100 mL IVPB        100 mg 200 mL/hr over 30 Minutes Intravenous Daily 02/04/2020 1426 02/13/20 0956   01/14/2020 1730  azithromycin (ZITHROMAX) 500 mg in sodium chloride 0.9 % 250 mL IVPB        500 mg 250 mL/hr over 60 Minutes Intravenous Every 24 hours 01/18/2020 1636 02/14/20 1816   01/16/2020 1730  cefTRIAXone (ROCEPHIN) 2 g in sodium chloride 0.9 % 100 mL IVPB        2 g 200 mL/hr over 30 Minutes Intravenous Every 24 hours 01/23/2020 1636 02/14/20 1802   02/09/20 1000  remdesivir 100 mg in sodium chloride 0.9 % 100 mL IVPB  Status:  Discontinued       "Followed by" Linked Group Details   100 mg 200 mL/hr over 30 Minutes Intravenous Daily 01/27/2020 2020 01/17/2020 1426   01/15/2020 2030  remdesivir 200 mg in sodium chloride 0.9% 250 mL IVPB  Status:  Discontinued       "Followed by" Linked Group Details   200 mg 580 mL/hr over 30 Minutes Intravenous Once 01/13/2020 2020 02/10/20 1426       Subjective/Interval History: Patient not very communicative but does not appear to be in any discomfort.  Denies any chest pain.  States that she is feeling better.  Discussed with nursing staff.     Assessment/Plan:  Acute Hypoxic Resp. Failure/Pneumonia due to COVID-19   Recent Labs  Lab 02/11/20 0442 03/09/2020 0915 02/13/20 0401 02/16/20 0344 02/17/20 0400  DDIMER 2.51* 1.59* 3.84*  --   --   CRP 2.7* 2.7* 2.6*  --   --   ALT 35 30 27 23 24   PROCALCITON 2.04 0.54  --   --   --      Objective findings: Oxygen requirements: On heated high flow at 15 L/min.  100% FiO2.  Saturating in the early 90s.    COVID 19 Therapeutics: Antibacterials: She completed a 5-day course of ceftriaxone and azithromycin Remdesivir: Patient completed 5-day course of Remdesivir Steroids: Remains on Solu-Medrol Diuretics: None yet Inhaled Steroids: On Dulera Baricitinib: Remains on baricitinib started on 1/1 PUD Prophylaxis: On PPI DVT Prophylaxis: SCDs only due to GI bleed  From a respiratory standpoint patient remains tenuous.  No significant improvement in the last 48 hours.  She has completed course of Remdesivir.  Remains on steroids and baricitinib.  Will give additional dose of furosemide today.  Continue incentive spirometry.  Mobilize as much as possible.  Patient has previously completed a course of antibacterials.  Chest x-ray done on 1/6  showed improvement in bilateral infiltrates.  Leukocytosis likely due to steroids  The treatment plan and use of medications and known side effects were discussed with patient/family. Some of the medications used are based on case reports/anecdotal data.  All other medications being used in the management of COVID-19 based on limited study data.  Complete risks and long-term side effects are unknown, however in the best clinical judgment they seem to be of some benefit.  Patient/family wanted to proceed with treatment options provided.  Atrial  fibrillation with RVR Was requiring a Cardizem infusion.  Subsequently placed on metoprolol as needed.  Heparin on hold due to GI bleed.   Heart rate was poorly controlled she was started on scheduled diltiazem orally.  Heart rate seems to be better controlled now.  Continue with current dose for now.  TSH noted to be 1.24.  Echocardiogram shows normal systolic function with grade 2 diastolic dysfunction.  No anticoagulation due to concern for GI bleed.  Hyperlipidemia Continue Lipitor.  GI bleed with melanotic stools Gastroenterology was consulted.  No endoscopy was done due to her tenuous respiratory status.  Hemoglobin has been stable.  Continue PPI.  No overt bleeding noted.  Essential hypertension Monitor blood pressure..  Patient on diltiazem.  Hyperglycemia HbA1c 6.2.  Continue SSI.  May need to add long-acting insulin.  Goals of care Conversation held with her daughter and son by critical care medicine.  Palliative care was also following.  Patient changed to DNR.  Hypokalemia Replace potassium today.  Magnesium was 2.5 when last checked.  Obesity Estimated body mass index is 32.78 kg/m as calculated from the following:   Height as of this encounter: 5\' 5"  (1.651 m).   Weight as of this encounter: 89.4 kg.   DVT Prophylaxis: SCDs Code Status: DNR Family Communication: Son was updated yesterday. Disposition Plan: Disposition remains unclear.  PT and OT evaluation.  Status is: Inpatient  Remains inpatient appropriate because:IV treatments appropriate due to intensity of illness or inability to take PO and Inpatient level of care appropriate due to severity of illness   Dispo: The patient is from: Home              Anticipated d/c is to: To be determined              Anticipated d/c date is: 3 days              Patient currently is not medically stable to d/c.      Medications:  Scheduled: . vitamin C  500 mg Oral Daily  . atorvastatin  40 mg Oral Daily  .  baricitinib  4 mg Oral Daily  . chlorhexidine  15 mL Mouth Rinse BID  . Chlorhexidine Gluconate Cloth  6 each Topical Daily  . cholecalciferol  1,000 Units Oral Daily  . diltiazem  60 mg Oral Q6H  . feeding supplement  237 mL Oral BID BM  . insulin aspart  0-15 Units Subcutaneous Q4H  . mouth rinse  15 mL Mouth Rinse q12n4p  . methylPREDNISolone (SOLU-MEDROL) injection  40 mg Intravenous BID  . mometasone-formoterol  2 puff Inhalation BID  . nystatin   Topical TID  . pantoprazole  40 mg Intravenous Q12H  . polyethylene glycol  17 g Oral BID  . zinc sulfate  220 mg Oral Daily   Continuous:  IDP:OEUMPNTIRWERX, albuterol, chlorpheniramine-HYDROcodone, guaiFENesin-dextromethorphan, hydrOXYzine, labetalol, ondansetron (ZOFRAN) IV, promethazine, sodium chloride, sodium chloride flush   Objective:  Vital Signs  Vitals:  02/17/20 0509 02/17/20 0600 02/17/20 0805 02/17/20 0922  BP: (!) 134/55 (!) 124/48 (!) 124/56   Pulse:  90 91 97  Resp:  (!) 22 18 20   Temp:  98.2 F (36.8 C) 98.5 F (36.9 C)   TempSrc:  Oral Axillary   SpO2:   93% 92%  Weight:      Height:        Intake/Output Summary (Last 24 hours) at 02/17/2020 1034 Last data filed at 02/17/2020 0500 Gross per 24 hour  Intake 237 ml  Output 1900 ml  Net -1663 ml   Filed Weights   02/04/2020 2139  Weight: 89.4 kg    General appearance: Awake alert.  In no distress.  Distracted Resp: Tachypneic at rest.  Coarse breath sounds with crackles bilateral bases.  No wheezing or rhonchi.   Cardio: S1-S2 is irregularly irregular.  Heart rate seems to be better on telemetry.  No S3-S4.  No rubs murmurs or bruit GI: Abdomen is soft.  Nontender nondistended.  Bowel sounds are present normal.  No masses organomegaly Extremities: No edema.   Neurologic:   No focal neurological deficits.     Lab Results:  Data Reviewed: I have personally reviewed following labs and imaging studies  CBC: Recent Labs  Lab 02/11/20 0442  02/22/2020 0915 02/13/20 0452 02/14/20 0313 02/17/20 0400  WBC 9.4 10.2 8.5 10.6* 16.4*  NEUTROABS 8.2* 8.8* 7.4  --   --   HGB 12.9 10.6* 11.7* 12.2 12.0  HCT 39.4 31.7* 34.6* 36.0 35.3*  MCV 90.0 93.2 90.6 90.9 89.8  PLT 247 235 224 205 [id number]    Basic Metabolic Panel: Recent Labs  Lab 02/11/20 0442 03/08/2020 0915 02/13/20 0401 02/14/20 0313 02/15/20 0218 02/16/20 0344 02/17/20 0400  NA 141 141 143 146* 142 143 143  K 3.9 3.6 3.9 3.9 3.4* 4.1 3.8  CL 102 99 103 103 103 105 103  CO2 28 28 29 30 28 29 30   GLUCOSE 186* 266* 177* 172* 195* 214* 214*  BUN 15 21 31* 42* 29* 27* 30*  CREATININE 0.74 0.80 0.80 0.77 0.70 0.82 0.83  CALCIUM 7.8* 7.9* 8.0* 8.0* 7.8* 7.9* 7.8*  MG 2.2 2.2  --   --  2.5* 2.5*  --   PHOS  --   --   --   --  3.2  --   --     GFR: Estimated Creatinine Clearance: 65.7 mL/min (by C-G formula based on SCr of 0.83 mg/dL).  Liver Function Tests: Recent Labs  Lab 02/11/20 0442 02/23/2020 0915 02/13/20 0401 02/16/20 0344 02/17/20 0400  AST 33 27 24 20 21   ALT 35 30 27 23 24   ALKPHOS 42 49 49 54 55  BILITOT 0.8 0.8 0.8 1.0 1.0  PROT 5.5* 5.3* 5.0* 5.1* 5.0*  ALBUMIN 2.5* 2.5* 2.3* 2.4* 2.4*    CBG: Recent Labs  Lab 02/16/20 1102 02/16/20 1537 02/16/20 1945 02/16/20 2309 02/17/20 0326  GLUCAP 213* 223* 232* 177* 177*      Recent Results (from the past 240 hour(s))  Resp Panel by RT-PCR (Flu A&B, Covid) Nasopharyngeal Swab     Status: Abnormal   Collection Time: 01/24/2020  6:28 PM   Specimen: Nasopharyngeal Swab; Nasopharyngeal(NP) swabs in vial transport medium  Result Value Ref Range Status   SARS Coronavirus 2 by RT PCR POSITIVE (A) NEGATIVE Final    Comment: RESULT CALLED TO, READ BACK BY AND VERIFIED WITH: Rick Duff RN 01/29/2020 AT 2213 SK  (NOTE) SARS-CoV-2 target  nucleic acids are DETECTED.  The SARS-CoV-2 RNA is generally detectable in upper respiratory specimens during the acute phase of infection. Positive results are indicative  of the presence of the identified virus, but do not rule out bacterial infection or co-infection with other pathogens not detected by the test. Clinical correlation with patient history and other diagnostic information is necessary to determine patient infection status. The expected result is Negative.  Fact Sheet for Patients: EntrepreneurPulse.com.au  Fact Sheet for Healthcare Providers: IncredibleEmployment.be  This test is not yet approved or cleared by the [place] FDA and  has been authorized for detection and/or diagnosis of SARS-CoV-2 by FDA under an Emergency Use Authorization (EUA).  This EUA will remain in effect (meaning this test can be  used) for the duration of  the COVID-19 declaration under Section 564(b)(1) of the Act, 21 U.S.C. section 360bbb-3(b)(1), unless the authorization is terminated or revoked sooner.     Influenza A by PCR NEGATIVE NEGATIVE Final   Influenza B by PCR NEGATIVE NEGATIVE Final    Comment: (NOTE) The Xpert Xpress SARS-CoV-2/FLU/RSV plus assay is intended as an aid in the diagnosis of influenza from Nasopharyngeal swab specimens and should not be used as a sole basis for treatment. Nasal washings and aspirates are unacceptable for Xpert Xpress SARS-CoV-2/FLU/RSV testing.  Fact Sheet for Patients: EntrepreneurPulse.com.au  Fact Sheet for Healthcare Providers: IncredibleEmployment.be  This test is not yet approved or cleared by the [place] FDA and has been authorized for detection and/or diagnosis of SARS-CoV-2 by FDA under an Emergency Use Authorization (EUA). This EUA will remain in effect (meaning this test can be used) for the duration of the COVID-19 declaration under Section 564(b)(1) of the Act, 21 U.S.C. section 360bbb-3(b)(1), unless the authorization is terminated or revoked.  Performed at [facility], [place] [address] [place],  [place] [address]   Culture, blood (Routine X 2) w Reflex to ID Panel     Status: None   Collection Time: 02/07/2020  9:14 PM   Specimen: BLOOD LEFT ARM  Result Value Ref Range Status   Specimen Description BLOOD LEFT ARM  Final   Special Requests   Final    BOTTLES DRAWN AEROBIC AND ANAEROBIC Blood Culture adequate volume   Culture   Final    NO GROWTH 5 DAYS Performed at [facility], [place] [address], [place], [place] [address]    Report Status 02/13/2020 FINAL  Final  Culture, blood (Routine X 2) w Reflex to ID Panel     Status: None   Collection Time: 02/09/20  9:07 AM   Specimen: Site Not Specified; Blood  Result Value Ref Range Status   Specimen Description SITE NOT SPECIFIED  Final   Special Requests   Final    BOTTLES DRAWN AEROBIC AND ANAEROBIC Blood Culture results may not be optimal due to an excessive volume of blood received in culture bottles   Culture   Final    NO GROWTH 5 DAYS Performed at [facility], [place] [address], [place], [place] [address]    Report Status 02/14/2020 FINAL  Final      Radiology Studies: DG Chest Port 1 View  Result Date: 02/16/2020 CLINICAL DATA:  COVID pneumonia EXAM: PORTABLE CHEST 1 VIEW COMPARISON:  02/11/2020 FINDINGS: Pulmonary insufflation has decreased and lung volumes are small, though symmetric. Superimposed pulmonary infiltrates appear improved, particularly within the perihilar and right lower lung zone. No pneumothorax. Tiny bilateral pleural effusions may be present.  Cardiac size within normal limits. No acute bone abnormality. Right upper extremity PICC line tip is seen at the superior cavoatrial junction. IMPRESSION: Improving multifocal pulmonary infiltrates, likely infectious. Diminishing pulmonary insufflation. Electronically Signed   By: Fidela Salisbury MD   On: 02/16/2020 05:17       LOS: 9 days   [facility] Pager on [email or web address]  02/17/2020, 10:34 AM

## 2020-02-17 NOTE — Progress Notes (Signed)
Physical Therapy Treatment Patient Details Name: Diane Mendoza MRN: [id number] DOB: 09-09-1945 Today's Date: 02/17/2020    History of Present Illness 75 y.o. female with medical history significant of hypertension, hyperlipidemia, PVD, S/p bil aorto-fem/pop) bypass grafting, GERD, history of cervical cancer COVID unvaccinated  presenting the ED 01/14/2020 with complaints of generalized weakness after testing positive for COVID 3 days ago  symptoms include fevers, chills, cough, shortness of breath, nausea, vomiting, and diarrhea. Admitted for treatment of Sepsis and acute hypoxemic respiratory failure secondary to COVID PNA, Active GI bleed, and new onset of Afib with RVR    PT Comments    Pt in bed on arrival, SpO2 91% on 50L HHFNC. Resting HR 111. Pt performed LE exercises in bed supine. Desat to 85% during ex and max HR 122. 1-2 minute rest to return to 90% SpO2. Pt declining further activity including assist with positioning in sidelying. PT to continue per POC.    Follow Up Recommendations  SNF;Supervision/Assistance - 24 hour (due to O2 requirements, will continue to reassess)     Equipment Recommendations  Rolling walker with 5" wheels    Recommendations for Other Services       Precautions / Restrictions Precautions Precautions: Fall;Other (comment) Precaution Comments: watch SPo2    Mobility  Bed Mobility                  Transfers                    Ambulation/Gait                 Stairs             Wheelchair Mobility    Modified Rankin (Stroke Patients Only)       Balance                                            Cognition Arousal/Alertness: Awake/alert Behavior During Therapy: Anxious Overall Cognitive Status: Impaired/Different from baseline Area of Impairment: Safety/judgement;Problem solving                         Safety/Judgement: Decreased awareness of safety   Problem Solving: Slow  processing;Difficulty sequencing;Requires verbal cues General Comments: Pt is very HOH. Minimally verbalizations during session but following directions with visual cues.      Exercises General Exercises - Lower Extremity Ankle Circles/Pumps: AROM;Both;20 reps;Supine Quad Sets: AROM;Both;10 reps;Supine Gluteal Sets: AROM;Both;10 reps;Supine Heel Slides: AROM;Right;Left;10 reps;Supine Hip ABduction/ADduction: AROM;Right;Left;10 reps;Supine Straight Leg Raises: AAROM;Right;Left;5 reps;Supine    General Comments        Pertinent Vitals/Pain Pain Assessment: No/denies pain    Home Living                      Prior Function            PT Goals (current goals can now be found in the care plan section) Acute Rehab PT Goals Patient Stated Goal: not stated Progress towards PT goals: Not progressing toward goals - comment (high O2 needs, poor activity tolerance)    Frequency           PT Plan Current plan remains appropriate    Co-evaluation              AM-PAC PT "6 Clicks" Mobility   Outcome Measure  Help needed turning from your back to your side while in a flat bed without using bedrails?: A Little Help needed moving from lying on your back to sitting on the side of a flat bed without using bedrails?: A Little Help needed moving to and from a bed to a chair (including a wheelchair)?: A Lot Help needed standing up from a chair using your arms (e.g., wheelchair or bedside chair)?: A Lot Help needed to walk in hospital room?: A Lot Help needed climbing 3-5 steps with a railing? : Total 6 Click Score: 13    End of Session Equipment Utilized During Treatment: Oxygen Activity Tolerance: Treatment limited secondary to medical complications (Comment) (hypoxia, poor activity tolerance) Patient left: in bed;with call bell/phone within reach;with bed alarm set Nurse Communication: Mobility status PT Visit Diagnosis: Unsteadiness on feet (R26.81);Other  abnormalities of gait and mobility (R26.89);Muscle weakness (generalized) (M62.81);Difficulty in walking, not elsewhere classified (R26.2)     Time: 3536-1443 PT Time Calculation (min) (ACUTE ONLY): 20 min  Charges:  $Therapeutic Exercise: 8-22 mins                     Lorrin Goodell, PT  Office # [phone] Pager [phone]    Lorriane Shire 02/17/2020, 12:29 PM

## 2020-02-18 DIAGNOSIS — E1165 Type 2 diabetes mellitus with hyperglycemia: Secondary | ICD-10-CM | POA: Diagnosis not present

## 2020-02-18 DIAGNOSIS — I4891 Unspecified atrial fibrillation: Secondary | ICD-10-CM | POA: Diagnosis not present

## 2020-02-18 DIAGNOSIS — U071 COVID-19: Secondary | ICD-10-CM | POA: Diagnosis not present

## 2020-02-18 DIAGNOSIS — J9601 Acute respiratory failure with hypoxia: Secondary | ICD-10-CM | POA: Diagnosis not present

## 2020-02-18 LAB — COMPREHENSIVE METABOLIC PANEL
ALT: 22 U/L (ref 0–44)
AST: 19 U/L (ref 15–41)
Albumin: 2.3 g/dL — ABNORMAL LOW (ref 3.5–5.0)
Alkaline Phosphatase: 54 U/L (ref 38–126)
Anion gap: 10 (ref 5–15)
BUN: 32 mg/dL — ABNORMAL HIGH (ref 8–23)
CO2: 28 mmol/L (ref 22–32)
Calcium: 7.9 mg/dL — ABNORMAL LOW (ref 8.9–10.3)
Chloride: 101 mmol/L (ref 98–111)
Creatinine, Ser: 0.73 mg/dL (ref 0.44–1.00)
GFR, Estimated: >60 mL/min (ref >60)
Glucose, Bld: 175 mg/dL — ABNORMAL HIGH (ref 70–99)
Potassium: 4 mmol/L (ref 3.5–5.1)
Sodium: 139 mmol/L (ref 135–145)
Total Bilirubin: 0.9 mg/dL (ref 0.3–1.2)
Total Protein: 5 g/dL — ABNORMAL LOW (ref 6.5–8.1)

## 2020-02-18 LAB — GLUCOSE, CAPILLARY
Glucose-Capillary: 166 mg/dL — ABNORMAL HIGH (ref 70–99)
Glucose-Capillary: 145 mg/dL — ABNORMAL HIGH (ref 70–99)
Glucose-Capillary: 171 mg/dL — ABNORMAL HIGH (ref 70–99)
Glucose-Capillary: 191 mg/dL — ABNORMAL HIGH (ref 70–99)
Glucose-Capillary: 214 mg/dL — ABNORMAL HIGH (ref 70–99)
Glucose-Capillary: 228 mg/dL — ABNORMAL HIGH (ref 70–99)

## 2020-02-18 LAB — MAGNESIUM: Magnesium: 2.3 mg/dL (ref 1.7–2.4)

## 2020-02-18 MED ORDER — POTASSIUM CHLORIDE CRYS ER 20 MEQ PO TBCR
20.0000 meq | EXTENDED_RELEASE_TABLET | Freq: Once | ORAL | Status: AC
  Administered 2020-02-18: 20 meq via ORAL
  Filled 2020-02-18: qty 1

## 2020-02-18 MED ORDER — PANTOPRAZOLE SODIUM 40 MG PO TBEC
40.0000 mg | DELAYED_RELEASE_TABLET | Freq: Two times a day (BID) | ORAL | Status: DC
  Administered 2020-02-18 – 2020-02-29 (×23): 40 mg via ORAL
  Filled 2020-02-18 (×23): qty 1

## 2020-02-18 MED ORDER — FUROSEMIDE 10 MG/ML IJ SOLN
40.0000 mg | Freq: Once | INTRAMUSCULAR | Status: AC
  Administered 2020-02-18: 40 mg via INTRAVENOUS
  Filled 2020-02-18: qty 4

## 2020-02-18 NOTE — Progress Notes (Signed)
PROGRESS NOTE  Diane Mendoza [id number] DOB: 10/09/1945 DOA: 02/06/2020  PCP: Pcp, No  Brief History/Interval Summary: 75 year old female with past medical history of essential hypertension, peripheral vascular disease, hyperlipidemia who tested positive for Covid on 12/23 presented on 12/29 with worsening weakness.  Was started on steroids Remdesivir and baricitinib.  Subsequently found to have new onset atrial fibrillation and started on Cardizem infusion.  There was also concern for GI bleeding [facility] gastroenterology was consulted.  No endoscopies were performed.  Over the course of the hospitalization patient has had increasing oxygen requirements.  Transferred to the ICU subsequently.  Reason for Visit: Pneumonia due to COVID-19.  Acute respiratory failure with hypoxia  Consultants: Pulmonology.  [facility]  Procedures: None yet  Antibiotics: Anti-infectives (From admission, onward)   Start     Dose/Rate Route Frequency Ordered Stop   02/11/20 1000  remdesivir 100 mg in sodium chloride 0.9 % 100 mL IVPB        100 mg 200 mL/hr over 30 Minutes Intravenous Daily 02/01/2020 1426 02/13/20 0956   01/30/2020 1730  azithromycin (ZITHROMAX) 500 mg in sodium chloride 0.9 % 250 mL IVPB        500 mg 250 mL/hr over 60 Minutes Intravenous Every 24 hours 02/05/2020 1636 02/14/20 1816   01/20/2020 1730  cefTRIAXone (ROCEPHIN) 2 g in sodium chloride 0.9 % 100 mL IVPB        2 g 200 mL/hr over 30 Minutes Intravenous Every 24 hours 02/06/2020 1636 02/14/20 1802   02/09/20 1000  remdesivir 100 mg in sodium chloride 0.9 % 100 mL IVPB  Status:  Discontinued       "Followed by" Linked Group Details   100 mg 200 mL/hr over 30 Minutes Intravenous Daily 02/03/2020 2020 02/03/2020 1426   01/28/2020 2030  remdesivir 200 mg in sodium chloride 0.9% 250 mL IVPB  Status:  Discontinued       "Followed by" Linked Group Details   200 mg 580 mL/hr over 30 Minutes Intravenous Once 01/26/2020 2020 01/26/2020 1426       Subjective/Interval History: Patient not very communicative but she did say that she is feeling better this morning.  Appetite is improving.  Denies any chest pain.  Shortness of breath is improving   Assessment/Plan:  Acute Hypoxic Resp. Failure/Pneumonia due to COVID-19   Recent Labs  Lab 02/17/2020 0915 02/13/20 0401 02/16/20 0344 02/17/20 0400 02/18/20 0301  DDIMER 1.59* 3.84*  --   --   --   CRP 2.7* 2.6*  --   --   --   ALT 30 27 23 24 22   PROCALCITON 0.54  --   --   --   --      Objective findings: Oxygen requirements: On heated high flow at 50 L/min, 80% FiO2 saturating in the early 90s.    COVID 19 Therapeutics: Antibacterials: She completed a 5-day course of ceftriaxone and azithromycin Remdesivir: Patient completed 5-day course of Remdesivir Steroids: Remains on Solu-Medrol Diuretics: Has been given steroids the last couple of days Inhaled Steroids: On Dulera Baricitinib: Remains on baricitinib started on 1/1 PUD Prophylaxis: On PPI DVT Prophylaxis: SCDs only due to GI bleed  From a respiratory standpoint patient remains tenuous though subjectively she feels better.  She completed course of Remdesivir.  Remains on steroids and baricitinib.  Oxygen requirements only slightly better compared to yesterday.  Chest x-ray done on 1/6 showed improvement in bilateral infiltrates.  Leukocytosis due to steroids.  Continue incentive spirometry  mobilization.  She had good diuresis with the furosemide.  Will repeat dose today.  The treatment plan and use of medications and known side effects were discussed with patient/family. Some of the medications used are based on case reports/anecdotal data.  All other medications being used in the management of COVID-19 based on limited study data.  Complete risks and long-term side effects are unknown, however in the best clinical judgment they seem to be of some benefit.  Patient/family wanted to proceed with treatment options  provided.  Atrial fibrillation with RVR Was requiring a Cardizem infusion.   Heart rate was poorly controlled. She was started on scheduled diltiazem orally.  Heart rate seems to be better controlled.  Continue current dose for now.  TSH noted to be 1.24.  Echocardiogram shows normal systolic function with grade 2 diastolic dysfunction.   No anticoagulation due to concern for GI bleed.  Hyperlipidemia Continue Lipitor.  GI bleed with melanotic stools Gastroenterology was consulted.  No endoscopy was done due to her tenuous respiratory status.  Hemoglobin has been stable.  Continue PPI.  No overt bleeding noted.  Essential hypertension Monitor blood pressure..  Patient on diltiazem.  Blood pressure is reasonably well controlled.  Hyperglycemia HbA1c 6.2.  Elevated CBGs due to steroids.  Continue SSI and Lantus.  Goals of care Conversation held with her daughter and son by critical care medicine.  Palliative care was also following.  Patient changed to DNR.  Hypokalemia Potassium is better today.  Magnesium 2.3.  Obesity Estimated body mass index is 32.78 kg/m as calculated from the following:   Height as of this encounter: 5\' 5"  (1.651 m).   Weight as of this encounter: 89.4 kg.   DVT Prophylaxis: SCDs Code Status: DNR Family Communication: Son being updated every other day. Disposition Plan: Will need to go to SNF when improved.  Status is: Inpatient  Remains inpatient appropriate because:IV treatments appropriate due to intensity of illness or inability to take PO and Inpatient level of care appropriate due to severity of illness   Dispo: The patient is from: Home              Anticipated d/c is to: To be determined              Anticipated d/c date is: 3 days              Patient currently is not medically stable to d/c.      Medications:  Scheduled: . vitamin C  500 mg Oral Daily  . atorvastatin  40 mg Oral Daily  . baricitinib  4 mg Oral Daily  .  chlorhexidine  15 mL Mouth Rinse BID  . Chlorhexidine Gluconate Cloth  6 each Topical Daily  . cholecalciferol  1,000 Units Oral Daily  . diltiazem  60 mg Oral Q6H  . feeding supplement  237 mL Oral BID BM  . insulin aspart  0-15 Units Subcutaneous Q4H  . insulin glargine  8 Units Subcutaneous Daily  . mouth rinse  15 mL Mouth Rinse q12n4p  . methylPREDNISolone (SOLU-MEDROL) injection  40 mg Intravenous BID  . mometasone-formoterol  2 puff Inhalation BID  . nystatin   Topical TID  . pantoprazole  40 mg Intravenous Q12H  . polyethylene glycol  17 g Oral BID  . zinc sulfate  220 mg Oral Daily   Continuous:  [id number], albuterol, chlorpheniramine-HYDROcodone, guaiFENesin-dextromethorphan, hydrOXYzine, labetalol, ondansetron (ZOFRAN) IV, promethazine, sodium chloride, sodium chloride flush   Objective:  Vital Signs  Vitals:   02/18/20 0633 02/18/20 0736 02/18/20 0905 02/18/20 1136  BP: (!) 141/78 (!) 120/56  (!) 140/55  Pulse: 87 80 89 (!) 101  Resp: 18 19 19 18   Temp:  98.3 F (36.8 C)  98.4 F (36.9 C)  TempSrc:  Axillary  Axillary  SpO2: 94% 96% 92% 90%  Weight:      Height:        Intake/Output Summary (Last 24 hours) at 02/18/2020 1252 Last data filed at 02/17/2020 2140 Gross per 24 hour  Intake 360 ml  Output --  Net 360 ml   Filed Weights   01/28/2020 2139  Weight: 89.4 kg    General appearance: Awake alert.  In no distress.  Distracted Resp: Tachypneic at rest.  Coarse breath sounds with crackles bilateral bases.  No wheezing or rhonchi.   Cardio: S1-S2 is irregularly irregular.  No S3-S4.  No rubs murmurs or bruit GI: Abdomen is soft.  Nontender nondistended.  Bowel sounds are present normal.  No masses organomegaly Extremities: No edema.  Moving all of her extremities Neurologic:   No focal neurological deficits.     Lab Results:  Data Reviewed: I have personally reviewed following labs and imaging studies  CBC: Recent Labs  Lab 03/08/2020 0915  02/13/20 0452 02/14/20 0313 02/17/20 0400  WBC 10.2 8.5 10.6* 16.4*  NEUTROABS 8.8* 7.4  --   --   HGB 10.6* 11.7* 12.2 12.0  HCT 31.7* 34.6* 36.0 35.3*  MCV 93.2 90.6 90.9 89.8  PLT 235 224 205 [id number]    Basic Metabolic Panel: Recent Labs  Lab 03/11/2020 0915 02/13/20 0401 02/14/20 0313 02/15/20 0218 02/16/20 0344 02/17/20 0400 02/18/20 0301  NA 141   < > 146* 142 143 143 139  K 3.6   < > 3.9 3.4* 4.1 3.8 4.0  CL 99   < > 103 103 105 103 101  CO2 28   < > 30 28 29 30 28   GLUCOSE 266*   < > 172* 195* 214* 214* 175*  BUN 21   < > 42* 29* 27* 30* 32*  CREATININE 0.80   < > 0.77 0.70 0.82 0.83 0.73  CALCIUM 7.9*   < > 8.0* 7.8* 7.9* 7.8* 7.9*  MG 2.2  --   --  2.5* 2.5*  --  2.3  PHOS  --   --   --  3.2  --   --   --    < > = values in this interval not displayed.    GFR: Estimated Creatinine Clearance: 68.2 mL/min (by C-G formula based on SCr of 0.73 mg/dL).  Liver Function Tests: Recent Labs  Lab 03/10/2020 0915 02/13/20 0401 02/16/20 0344 02/17/20 0400 02/18/20 0301  AST 27 24 20 21 19   ALT 30 27 23 24 22   ALKPHOS 49 49 54 55 54  BILITOT 0.8 0.8 1.0 1.0 0.9  PROT 5.3* 5.0* 5.1* 5.0* 5.0*  ALBUMIN 2.5* 2.3* 2.4* 2.4* 2.3*    CBG: Recent Labs  Lab 02/17/20 2041 02/18/20 0106 02/18/20 0351 02/18/20 0734 02/18/20 1140  GLUCAP 195* 166* 145* 171* 191*      Recent Results (from the past 240 hour(s))  Resp Panel by RT-PCR (Flu A&B, Covid) Nasopharyngeal Swab     Status: Abnormal   Collection Time: 01/17/2020  6:28 PM   Specimen: Nasopharyngeal Swab; Nasopharyngeal(NP) swabs in vial transport medium  Result Value Ref Range Status   SARS Coronavirus 2 by RT PCR POSITIVE (A) NEGATIVE Final  Comment: RESULT CALLED TO, READ BACK BY AND VERIFIED WITH: Rick Duff RN 01/12/2020 AT 2213 SK  (NOTE) SARS-CoV-2 target nucleic acids are DETECTED.  The SARS-CoV-2 RNA is generally detectable in upper respiratory specimens during the acute phase of infection. Positive  results are indicative of the presence of the identified virus, but do not rule out bacterial infection or co-infection with other pathogens not detected by the test. Clinical correlation with patient history and other diagnostic information is necessary to determine patient infection status. The expected result is Negative.  Fact Sheet for Patients: EntrepreneurPulse.com.au  Fact Sheet for Healthcare Providers: IncredibleEmployment.be  This test is not yet approved or cleared by the [place] FDA and  has been authorized for detection and/or diagnosis of SARS-CoV-2 by FDA under an Emergency Use Authorization (EUA).  This EUA will remain in effect (meaning this test can be  used) for the duration of  the COVID-19 declaration under Section 564(b)(1) of the Act, 21 U.S.C. section 360bbb-3(b)(1), unless the authorization is terminated or revoked sooner.     Influenza A by PCR NEGATIVE NEGATIVE Final   Influenza B by PCR NEGATIVE NEGATIVE Final    Comment: (NOTE) The Xpert Xpress SARS-CoV-2/FLU/RSV plus assay is intended as an aid in the diagnosis of influenza from Nasopharyngeal swab specimens and should not be used as a sole basis for treatment. Nasal washings and aspirates are unacceptable for Xpert Xpress SARS-CoV-2/FLU/RSV testing.  Fact Sheet for Patients: EntrepreneurPulse.com.au  Fact Sheet for Healthcare Providers: IncredibleEmployment.be  This test is not yet approved or cleared by the [place] FDA and has been authorized for detection and/or diagnosis of SARS-CoV-2 by FDA under an Emergency Use Authorization (EUA). This EUA will remain in effect (meaning this test can be used) for the duration of the COVID-19 declaration under Section 564(b)(1) of the Act, 21 U.S.C. section 360bbb-3(b)(1), unless the authorization is terminated or revoked.  Performed at [facility], [place]  [address] [place], [place] [address]   Culture, blood (Routine X 2) w Reflex to ID Panel     Status: None   Collection Time: 01/15/2020  9:14 PM   Specimen: BLOOD LEFT ARM  Result Value Ref Range Status   Specimen Description BLOOD LEFT ARM  Final   Special Requests   Final    BOTTLES DRAWN AEROBIC AND ANAEROBIC Blood Culture adequate volume   Culture   Final    NO GROWTH 5 DAYS Performed at [facility], [place] [address], [place], [place] [address]    Report Status 02/13/2020 FINAL  Final  Culture, blood (Routine X 2) w Reflex to ID Panel     Status: None   Collection Time: 02/09/20  9:07 AM   Specimen: Site Not Specified; Blood  Result Value Ref Range Status   Specimen Description SITE NOT SPECIFIED  Final   Special Requests   Final    BOTTLES DRAWN AEROBIC AND ANAEROBIC Blood Culture results may not be optimal due to an excessive volume of blood received in culture bottles   Culture   Final    NO GROWTH 5 DAYS Performed at [facility], [place] [address], [place], [place] [address]    Report Status 02/14/2020 FINAL  Final      Radiology Studies: No results found.     LOS: 10 days   Brallan Denio [employer] on [email or web address]  02/18/2020, 12:52 PM

## 2020-02-18 NOTE — Progress Notes (Signed)
PMT provider has continued to shadow chart. DNR/DNI. Plan has been to continue current plan of care and medical management. Patient transferred out of ICU. Spoke with Dr. [place] Pink. No palliative needs today. He has been updating son regularly. PMT will continue to shadow chart for needs/decline. Thank you.  NO CHARGE  Ihor Dow, [place], FNP-C Palliative Medicine Team  Phone: [phone] Fax: [phone]

## 2020-02-19 DIAGNOSIS — U071 COVID-19: Secondary | ICD-10-CM | POA: Diagnosis not present

## 2020-02-19 DIAGNOSIS — I4891 Unspecified atrial fibrillation: Secondary | ICD-10-CM | POA: Diagnosis not present

## 2020-02-19 DIAGNOSIS — J9601 Acute respiratory failure with hypoxia: Secondary | ICD-10-CM | POA: Diagnosis not present

## 2020-02-19 DIAGNOSIS — E1165 Type 2 diabetes mellitus with hyperglycemia: Secondary | ICD-10-CM | POA: Diagnosis not present

## 2020-02-19 LAB — COMPREHENSIVE METABOLIC PANEL
ALT: 22 U/L (ref 0–44)
AST: 20 U/L (ref 15–41)
Albumin: 2.2 g/dL — ABNORMAL LOW (ref 3.5–5.0)
Alkaline Phosphatase: 58 U/L (ref 38–126)
Anion gap: 10 (ref 5–15)
BUN: 31 mg/dL — ABNORMAL HIGH (ref 8–23)
CO2: 28 mmol/L (ref 22–32)
Calcium: 7.8 mg/dL — ABNORMAL LOW (ref 8.9–10.3)
Chloride: 99 mmol/L (ref 98–111)
Creatinine, Ser: 0.74 mg/dL (ref 0.44–1.00)
GFR, Estimated: >60 mL/min (ref >60)
Glucose, Bld: 141 mg/dL — ABNORMAL HIGH (ref 70–99)
Potassium: 4.2 mmol/L (ref 3.5–5.1)
Sodium: 137 mmol/L (ref 135–145)
Total Bilirubin: 0.6 mg/dL (ref 0.3–1.2)
Total Protein: 4.7 g/dL — ABNORMAL LOW (ref 6.5–8.1)

## 2020-02-19 LAB — GLUCOSE, CAPILLARY
Glucose-Capillary: 128 mg/dL — ABNORMAL HIGH (ref 70–99)
Glucose-Capillary: 134 mg/dL — ABNORMAL HIGH (ref 70–99)
Glucose-Capillary: 150 mg/dL — ABNORMAL HIGH (ref 70–99)
Glucose-Capillary: 154 mg/dL — ABNORMAL HIGH (ref 70–99)
Glucose-Capillary: 173 mg/dL — ABNORMAL HIGH (ref 70–99)
Glucose-Capillary: 189 mg/dL — ABNORMAL HIGH (ref 70–99)

## 2020-02-19 MED ORDER — FUROSEMIDE 10 MG/ML IJ SOLN
40.0000 mg | Freq: Once | INTRAMUSCULAR | Status: AC
  Administered 2020-02-19: 40 mg via INTRAVENOUS
  Filled 2020-02-19: qty 4

## 2020-02-19 NOTE — Progress Notes (Signed)
PROGRESS NOTE  Diane Mendoza ELF:[id number] DOB: October 17, 1945 DOA: 02/06/2020  PCP: Pcp, No  Brief History/Interval Summary: 75 year old female with past medical history of essential hypertension, peripheral vascular disease, hyperlipidemia who tested positive for Covid on 12/23 presented on 12/29 with worsening weakness.  Was started on steroids Remdesivir and baricitinib.  Subsequently found to have new onset atrial fibrillation and started on Cardizem infusion.  There was also concern for GI bleeding [facility] gastroenterology was consulted.  No endoscopies were performed.  Over the course of the hospitalization patient has had increasing oxygen requirements.  Transferred to the ICU subsequently.  Reason for Visit: Pneumonia due to COVID-19.  Acute respiratory failure with hypoxia  Consultants: Pulmonology.  [facility]  Procedures: None yet  Antibiotics: Anti-infectives (From admission, onward)   Start     Dose/Rate Route Frequency Ordered Stop   02/11/20 1000  remdesivir 100 mg in sodium chloride 0.9 % 100 mL IVPB        100 mg 200 mL/hr over 30 Minutes Intravenous Daily 01/16/2020 1426 02/13/20 0956   01/16/2020 1730  azithromycin (ZITHROMAX) 500 mg in sodium chloride 0.9 % 250 mL IVPB        500 mg 250 mL/hr over 60 Minutes Intravenous Every 24 hours 01/26/2020 1636 02/14/20 1816   02/07/2020 1730  cefTRIAXone (ROCEPHIN) 2 g in sodium chloride 0.9 % 100 mL IVPB        2 g 200 mL/hr over 30 Minutes Intravenous Every 24 hours 01/15/2020 1636 02/14/20 1802   02/09/20 1000  remdesivir 100 mg in sodium chloride 0.9 % 100 mL IVPB  Status:  Discontinued       "Followed by" Linked Group Details   100 mg 200 mL/hr over 30 Minutes Intravenous Daily 02/06/2020 2020 01/25/2020 1426   02/03/2020 2030  remdesivir 200 mg in sodium chloride 0.9% 250 mL IVPB  Status:  Discontinued       "Followed by" Linked Group Details   200 mg 580 mL/hr over 30 Minutes Intravenous Once 01/19/2020 2020 01/12/2020 1426       Subjective/Interval History: Patient not very communicative and she is also hard of hearing.  Denies any complaints.  States that she is feeling better.  Discussed with nursing staff.  No overnight issues.     Assessment/Plan:  Acute Hypoxic Resp. Failure/Pneumonia due to COVID-19   Recent Labs  Lab 02/13/20 0401 02/16/20 0344 02/17/20 0400 02/18/20 0301 02/19/20 0423  DDIMER 3.84*  --   --   --   --   CRP 2.6*  --   --   --   --   ALT 27 23 24 22 22      Objective findings: Oxygen requirements: Remains on heated high flow.  45 L/min.  90% FiO2.  Saturating in the early 90s.     COVID 19 Therapeutics: Antibacterials: She completed a 5-day course of ceftriaxone and azithromycin Remdesivir: Patient completed 5-day course of Remdesivir Steroids: Remains on Solu-Medrol Diuretics: Furosemide has been given the last few days Inhaled Steroids: On Dulera Baricitinib: Remains on baricitinib started on 1/1 PUD Prophylaxis: On PPI DVT Prophylaxis: SCDs only due to GI bleed  From a respiratory standpoint patient remains tenuous.  Subjectively she is feeling better but she remains on high amounts of oxygen.  She is completed course of Remdesivir.  Remains on steroids and baricitinib.  Chest x-ray tomorrow morning.  Leukocytosis with thought to be due to steroids.  Continue incentive spirometry and mobilization.  We will repeat  another dose of furosemide today  The treatment plan and use of medications and known side effects were discussed with patient/family. Some of the medications used are based on case reports/anecdotal data.  All other medications being used in the management of COVID-19 based on limited study data.  Complete risks and long-term side effects are unknown, however in the best clinical judgment they seem to be of some benefit.  Patient/family wanted to proceed with treatment options provided.  Atrial fibrillation with RVR Was requiring a Cardizem infusion.   Heart  rate was poorly controlled. She was started on scheduled diltiazem orally.  Heart rate seems to be better controlled.  Continue current dose for now.  TSH noted to be 1.24.  Echocardiogram shows normal systolic function with grade 2 diastolic dysfunction.   No anticoagulation due to concern for GI bleed.  Hyperlipidemia Continue Lipitor.  GI bleed with melanotic stools Gastroenterology was consulted.  No endoscopy was done due to her tenuous respiratory status.  Hemoglobin has been stable.  Continue PPI.  No overt bleeding noted.  Essential hypertension Monitor blood pressure..  Patient on diltiazem.  Blood pressure is reasonably well controlled.  Hyperglycemia HbA1c 6.2.  Elevated CBGs due to steroids.  Continue SSI and Lantus.  Goals of care Conversation held with her daughter and son by critical care medicine.  Palliative care was also following.  Patient changed to DNR.  Hypokalemia Potassium is better today.  Magnesium 2.3.  Obesity Estimated body mass index is 32.78 kg/m as calculated from the following:   Height as of this encounter: 5\' 5"  (1.651 m).   Weight as of this encounter: 89.4 kg.   DVT Prophylaxis: SCDs Code Status: DNR Family Communication: Son being updated every other day. Disposition Plan: Will need to go to SNF when improved.  Status is: Inpatient  Remains inpatient appropriate because:IV treatments appropriate due to intensity of illness or inability to take PO and Inpatient level of care appropriate due to severity of illness   Dispo: The patient is from: Home              Anticipated d/c is to: To be determined              Anticipated d/c date is: 3 days              Patient currently is not medically stable to d/c.      Medications:  Scheduled: . vitamin C  500 mg Oral Daily  . atorvastatin  40 mg Oral Daily  . baricitinib  4 mg Oral Daily  . chlorhexidine  15 mL Mouth Rinse BID  . Chlorhexidine Gluconate Cloth  6 each Topical Daily  .  cholecalciferol  1,000 Units Oral Daily  . diltiazem  60 mg Oral Q6H  . feeding supplement  237 mL Oral BID BM  . insulin aspart  0-15 Units Subcutaneous Q4H  . insulin glargine  8 Units Subcutaneous Daily  . mouth rinse  15 mL Mouth Rinse q12n4p  . methylPREDNISolone (SOLU-MEDROL) injection  40 mg Intravenous BID  . mometasone-formoterol  2 puff Inhalation BID  . nystatin   Topical TID  . pantoprazole  40 mg Oral BID  . polyethylene glycol  17 g Oral BID  . zinc sulfate  220 mg Oral Daily   Continuous:  [id number], albuterol, chlorpheniramine-HYDROcodone, guaiFENesin-dextromethorphan, hydrOXYzine, labetalol, ondansetron (ZOFRAN) IV, promethazine, sodium chloride, sodium chloride flush   Objective:  Vital Signs  Vitals:   02/19/20 0700 02/19/20 0914 02/19/20  1003 02/19/20 1111  BP: (!) 121/49  136/66 (!) 134/51  Pulse: 82 (!) 107 92 89  Resp: 17 (!) 22 19   Temp: 98.1 F (36.7 C)  98.2 F (36.8 C) 98.2 F (36.8 C)  TempSrc: Axillary  Oral Oral  SpO2: (!) 89% 91% 93% 91%  Weight:      Height:        Intake/Output Summary (Last 24 hours) at 02/19/2020 1232 Last data filed at 02/19/2020 1109 Gross per 24 hour  Intake 360 ml  Output 1750 ml  Net -1390 ml   Filed Weights   01/30/2020 2139  Weight: 89.4 kg    General appearance: Awake alert.  In no distress.  Remains distracted Resp: Tachypneic at rest.  No use of accessory muscles noted.  Coarse breath sounds with crackles bilateral bases.  No wheezing or rhonchi. Cardio: S1-S2 is irregularly irregular.  No S3-S4.  No rubs murmurs or bruit GI: Abdomen is soft.  Nontender nondistended.  Bowel sounds are present normal.  No masses organomegaly Extremities: No edema.  Able to move all of her extremities Neurologic: No focal neurological deficits.      Lab Results:  Data Reviewed: I have personally reviewed following labs and imaging studies  CBC: Recent Labs  Lab 02/13/20 0452 02/14/20 0313 02/17/20 0400   WBC 8.5 10.6* 16.4*  NEUTROABS 7.4  --   --   HGB 11.7* 12.2 12.0  HCT 34.6* 36.0 35.3*  MCV 90.6 90.9 89.8  PLT 224 205 160    Basic Metabolic Panel: Recent Labs  Lab 02/15/20 0218 02/16/20 0344 02/17/20 0400 02/18/20 0301 02/19/20 0423  NA 142 143 143 139 137  K 3.4* 4.1 3.8 4.0 4.2  CL 103 105 103 101 99  CO2 28 29 30 28 28   GLUCOSE 195* 214* 214* 175* 141*  BUN 29* 27* 30* 32* 31*  CREATININE 0.70 0.82 0.83 0.73 0.74  CALCIUM 7.8* 7.9* 7.8* 7.9* 7.8*  MG 2.5* 2.5*  --  2.3  --   PHOS 3.2  --   --   --   --     GFR: Estimated Creatinine Clearance: 68.2 mL/min (by C-G formula based on SCr of 0.74 mg/dL).  Liver Function Tests: Recent Labs  Lab 02/13/20 0401 02/16/20 0344 02/17/20 0400 02/18/20 0301 02/19/20 0423  AST 24 20 21 19 20   ALT 27 23 24 22 22   ALKPHOS 49 54 55 54 58  BILITOT 0.8 1.0 1.0 0.9 0.6  PROT 5.0* 5.1* 5.0* 5.0* 4.7*  ALBUMIN 2.3* 2.4* 2.4* 2.3* 2.2*    CBG: Recent Labs  Lab 02/18/20 2002 02/19/20 0049 02/19/20 0453 02/19/20 0726 02/19/20 1114  GLUCAP 228* 154* 134* 150* 128*      No results found for this or any previous visit (from the past 240 hour(s)).    Radiology Studies: No results found.     LOS: 11 days   Gregory Dowe [employer] on [email or web address]  02/19/2020, 12:32 PM

## 2020-02-20 ENCOUNTER — Inpatient Hospital Stay (HOSPITAL_COMMUNITY): Payer: Medicare PPO

## 2020-02-20 DIAGNOSIS — J9601 Acute respiratory failure with hypoxia: Secondary | ICD-10-CM | POA: Diagnosis not present

## 2020-02-20 DIAGNOSIS — U071 COVID-19: Secondary | ICD-10-CM | POA: Diagnosis not present

## 2020-02-20 DIAGNOSIS — I4891 Unspecified atrial fibrillation: Secondary | ICD-10-CM | POA: Diagnosis not present

## 2020-02-20 DIAGNOSIS — E1165 Type 2 diabetes mellitus with hyperglycemia: Secondary | ICD-10-CM | POA: Diagnosis not present

## 2020-02-20 LAB — COMPREHENSIVE METABOLIC PANEL
ALT: 27 U/L (ref 0–44)
AST: 22 U/L (ref 15–41)
Albumin: 2.3 g/dL — ABNORMAL LOW (ref 3.5–5.0)
Alkaline Phosphatase: 62 U/L (ref 38–126)
Anion gap: 9 (ref 5–15)
BUN: 33 mg/dL — ABNORMAL HIGH (ref 8–23)
CO2: 29 mmol/L (ref 22–32)
Calcium: 7.8 mg/dL — ABNORMAL LOW (ref 8.9–10.3)
Chloride: 99 mmol/L (ref 98–111)
Creatinine, Ser: 0.83 mg/dL (ref 0.44–1.00)
GFR, Estimated: >60 mL/min (ref >60)
Glucose, Bld: 176 mg/dL — ABNORMAL HIGH (ref 70–99)
Potassium: 4.2 mmol/L (ref 3.5–5.1)
Sodium: 137 mmol/L (ref 135–145)
Total Bilirubin: 1 mg/dL (ref 0.3–1.2)
Total Protein: 5.1 g/dL — ABNORMAL LOW (ref 6.5–8.1)

## 2020-02-20 LAB — CBC
HCT: 37.6 % (ref 36.0–46.0)
Hemoglobin: 12.4 g/dL (ref 12.0–15.0)
MCH: 29.3 pg (ref 26.0–34.0)
MCHC: 33 g/dL (ref 30.0–36.0)
MCV: 88.9 fL (ref 80.0–100.0)
Platelets: 220 10*3/uL (ref 150–400)
RBC: 4.23 MIL/uL (ref 3.87–5.11)
RDW: 12.7 % (ref 11.5–15.5)
WBC: 16.4 10*3/uL — ABNORMAL HIGH (ref 4.0–10.5)
nRBC: 0 % (ref 0.0–0.2)

## 2020-02-20 LAB — GLUCOSE, CAPILLARY
Glucose-Capillary: 139 mg/dL — ABNORMAL HIGH (ref 70–99)
Glucose-Capillary: 153 mg/dL — ABNORMAL HIGH (ref 70–99)
Glucose-Capillary: 159 mg/dL — ABNORMAL HIGH (ref 70–99)
Glucose-Capillary: 169 mg/dL — ABNORMAL HIGH (ref 70–99)
Glucose-Capillary: 170 mg/dL — ABNORMAL HIGH (ref 70–99)
Glucose-Capillary: 172 mg/dL — ABNORMAL HIGH (ref 70–99)
Glucose-Capillary: 173 mg/dL — ABNORMAL HIGH (ref 70–99)

## 2020-02-20 MED ORDER — FUROSEMIDE 10 MG/ML IJ SOLN
40.0000 mg | Freq: Once | INTRAMUSCULAR | Status: AC
  Administered 2020-02-20: 40 mg via INTRAVENOUS
  Filled 2020-02-20: qty 4

## 2020-02-20 NOTE — Progress Notes (Signed)
Physical Therapy Treatment Patient Details Name: Diane Mendoza MRN: [id number] DOB: 01-Apr-1945 Today's Date: 02/20/2020    History of Present Illness 75 y.o. female with medical history significant of hypertension, hyperlipidemia, PVD, S/p bil aorto-fem/pop) bypass grafting, GERD, history of cervical cancer COVID unvaccinated  presenting the ED 01/12/2020 with complaints of generalized weakness after testing positive for COVID 3 days ago  symptoms include fevers, chills, cough, shortness of breath, nausea, vomiting, and diarrhea. Admitted for treatment of Sepsis and acute hypoxemic respiratory failure secondary to COVID PNA, Active GI bleed, and new onset of Afib with RVR    PT Comments    Patient willing to attempt OOB, however once seated EOB she began to feel nauseated and HR incr to 146bpm and sats decr to 82% with gentle LE exercises . Patient returned to supine and positioned with HOB 45 Degrees. Once repositioned she was saturating 88% on HHFNC with HR 90.     Follow Up Recommendations  SNF;Supervision/Assistance - 24 hour (due to O2 requirements, will continue to reassess)     Equipment Recommendations  Rolling walker with 5" wheels    Recommendations for Other Services       Precautions / Restrictions Precautions Precautions: Fall;Other (comment) Precaution Comments: watch SPo2    Mobility  Bed Mobility Overal bed mobility: Needs Assistance Bed Mobility: Supine to Sit;Sit to Supine     Supine to sit: Min assist;HOB elevated Sit to supine: Supervision   General bed mobility comments: minA for scooting pelvis to EOB once in sitting;  Transfers                 General transfer comment: felt nauseated and wanted to return to bed; no orthostatic  Ambulation/Gait                 Stairs             Wheelchair Mobility    Modified Rankin (Stroke Patients Only)       Balance Overall balance assessment: Needs assistance Sitting-balance support:  Feet supported;Bilateral upper extremity supported Sitting balance-Leahy Scale: Fair                                      Cognition Arousal/Alertness: Awake/alert Behavior During Therapy: WFL for tasks assessed/performed Overall Cognitive Status: Difficult to assess                                 General Comments: Pt is very HOH. Minimally verbalizations during session but following directions with visual cues.      Exercises General Exercises - Lower Extremity Ankle Circles/Pumps: AROM;Both;20 reps;Supine Long Arc Quad: AROM;Both;10 reps;Seated    General Comments General comments (skin integrity, edema, etc.): on 45L 100% FiO2 but willing to attempt to get OOB      Pertinent Vitals/Pain Pain Assessment: No/denies pain    Home Living                      Prior Function            PT Goals (current goals can now be found in the care plan section) Acute Rehab PT Goals Patient Stated Goal: not stated Time For Goal Achievement: 02/25/20 Potential to Achieve Goals: Fair Progress towards PT goals: Progressing toward goals    Frequency    Min 3X/week  PT Plan Current plan remains appropriate    Co-evaluation              AM-PAC PT "6 Clicks" Mobility   Outcome Measure  Help needed turning from your back to your side while in a flat bed without using bedrails?: A Little Help needed moving from lying on your back to sitting on the side of a flat bed without using bedrails?: A Little Help needed moving to and from a bed to a chair (including a wheelchair)?: A Lot Help needed standing up from a chair using your arms (e.g., wheelchair or bedside chair)?: A Lot Help needed to walk in hospital room?: Total Help needed climbing 3-5 steps with a railing? : Total 6 Click Score: 12    End of Session Equipment Utilized During Treatment: Oxygen Activity Tolerance: Treatment limited secondary to medical complications  (Comment) (nausea, poor activity tolerance (elevated HR)) Patient left: in bed;with call bell/phone within reach;with bed alarm set Nurse Communication: Mobility status (Nurse tech) PT Visit Diagnosis: Unsteadiness on feet (R26.81);Other abnormalities of gait and mobility (R26.89);Muscle weakness (generalized) (M62.81);Difficulty in walking, not elsewhere classified (R26.2)     Time: 6160-7371 PT Time Calculation (min) (ACUTE ONLY): 38 min  Charges:  $Therapeutic Exercise: 8-22 mins $Therapeutic Activity: 23-37 mins                      Arby Barrette, PT Pager [phone]    Rexanne Mano 02/20/2020, 12:39 PM

## 2020-02-20 NOTE — Plan of Care (Signed)
VSS. Remains on 45L HHFNC. Q2 turns. Purewick in place. No complaints overnight. Call bell within reach. Bed alarm on.  Problem: Education: Goal: Knowledge of risk factors and measures for prevention of condition will improve Outcome: Progressing   Problem: Coping: Goal: Psychosocial and spiritual needs will be supported Outcome: Progressing   Problem: Respiratory: Goal: Will maintain a patent airway Outcome: Progressing Goal: Complications related to the disease process, condition or treatment will be avoided or minimized Outcome: Progressing

## 2020-02-20 NOTE — Plan of Care (Signed)
  Problem: Education: Goal: Knowledge of risk factors and measures for prevention of condition will improve Outcome: Progressing   

## 2020-02-20 NOTE — Progress Notes (Signed)
PROGRESS NOTE  Diane Mendoza UTM:[id number] DOB: 12/17/1945 DOA: 01/28/2020  PCP: Pcp, No  Brief History/Interval Summary: 75 year old female with past medical history of essential hypertension, peripheral vascular disease, hyperlipidemia who tested positive for Covid on 12/23 presented on 12/29 with worsening weakness.  Was started on steroids Remdesivir and baricitinib.  Subsequently found to have new onset atrial fibrillation and started on Cardizem infusion.  There was also concern for GI bleeding [facility] gastroenterology was consulted.  No endoscopies were performed.  Over the course of the hospitalization patient has had increasing oxygen requirements.  Transferred to the ICU subsequently.  Reason for Visit: Pneumonia due to COVID-19.  Acute respiratory failure with hypoxia  Consultants: Pulmonology.  [facility]  Procedures: None yet  Antibiotics: Anti-infectives (From admission, onward)   Start     Dose/Rate Route Frequency Ordered Stop   02/11/20 1000  remdesivir 100 mg in sodium chloride 0.9 % 100 mL IVPB        100 mg 200 mL/hr over 30 Minutes Intravenous Daily 02/01/2020 1426 02/13/20 0956   02/01/2020 1730  azithromycin (ZITHROMAX) 500 mg in sodium chloride 0.9 % 250 mL IVPB        500 mg 250 mL/hr over 60 Minutes Intravenous Every 24 hours 02/09/2020 1636 02/14/20 1816   02/09/2020 1730  cefTRIAXone (ROCEPHIN) 2 g in sodium chloride 0.9 % 100 mL IVPB        2 g 200 mL/hr over 30 Minutes Intravenous Every 24 hours 01/20/2020 1636 02/14/20 1802   02/09/20 1000  remdesivir 100 mg in sodium chloride 0.9 % 100 mL IVPB  Status:  Discontinued       "Followed by" Linked Group Details   100 mg 200 mL/hr over 30 Minutes Intravenous Daily 01/29/2020 2020 02/07/2020 1426   02/02/2020 2030  remdesivir 200 mg in sodium chloride 0.9% 250 mL IVPB  Status:  Discontinued       "Followed by" Linked Group Details   200 mg 580 mL/hr over 30 Minutes Intravenous Once 02/09/2020 2020 01/28/2020 1426       Subjective/Interval History: Patient lying comfortably on the bed.  Denies any complaints.  States that she is feeling well.  Patient is hard of hearing.   Assessment/Plan:  Acute Hypoxic Resp. Failure/Pneumonia due to COVID-19   Recent Labs  Lab 02/16/20 0344 02/17/20 0400 02/18/20 0301 02/19/20 0423 02/20/20 0111  ALT 23 24 22 22 27      Objective findings: Oxygen requirements: Remains on heated high flow.  45 L/min, 80% FiO2.  Saturations noted to be in the early 64s.    COVID 19 Therapeutics: Antibacterials: She completed a 5-day course of ceftriaxone and azithromycin Remdesivir: Patient completed 5-day course of Remdesivir Steroids: Remains on Solu-Medrol Diuretics: Furosemide has been given the last few days Inhaled Steroids: On Dulera Baricitinib: Remains on baricitinib started on 1/1 PUD Prophylaxis: On PPI DVT Prophylaxis: SCDs only due to GI bleed  From a respiratory standpoint patient is tenuous.  Subjectively she is feeling better.  Remains on high amounts of oxygen.  She has completed course of Remdesivir.  Remains on steroids and 14-day course of baricitinib.  Chest x-ray done this morning shows slight improvement.  Continue current management for now.  Leukocytosis is thought to be due to steroids.  Continue incentive spirometry mobilization.  Give additional dose of furosemide today.  The treatment plan and use of medications and known side effects were discussed with patient/family. Some of the medications used are based on case  reports/anecdotal data.  All other medications being used in the management of COVID-19 based on limited study data.  Complete risks and long-term side effects are unknown, however in the best clinical judgment they seem to be of some benefit.  Patient/family wanted to proceed with treatment options provided.  Atrial fibrillation with RVR Was requiring a Cardizem infusion initially. Heart rate was poorly controlled. She was started  on scheduled diltiazem orally.  Heart rate seems to be better controlled.  Continue current dose for now.  TSH noted to be 1.24.  Echocardiogram shows normal systolic function with grade 2 diastolic dysfunction.   No anticoagulation due to concern for GI bleed.  Hyperlipidemia Continue Lipitor.  GI bleed with melanotic stools Gastroenterology was consulted.  No endoscopy was done due to her tenuous respiratory status.  Hemoglobin has been stable.  Continue PPI.  No overt bleeding noted.  Essential hypertension Monitor blood pressure..  Patient on diltiazem.  Blood pressure is reasonably well controlled.  Hyperglycemia HbA1c 6.2.  Elevated CBGs due to steroids.  Continue SSI and Lantus.  Goals of care Conversation held with her daughter and son by critical care medicine.  Palliative care was also following.  Patient changed to DNR.  Hypokalemia Has been corrected.  Obesity Estimated body mass index is 32.78 kg/m as calculated from the following:   Height as of this encounter: 5\' 5"  (1.651 m).   Weight as of this encounter: 89.4 kg.   DVT Prophylaxis: SCDs Code Status: DNR Family Communication: Son being updated every other day. Disposition Plan: Will need to go to SNF when improved.  Status is: Inpatient  Remains inpatient appropriate because:IV treatments appropriate due to intensity of illness or inability to take PO and Inpatient level of care appropriate due to severity of illness   Dispo: The patient is from: Home              Anticipated d/c is to: To be determined              Anticipated d/c date is: 3 days              Patient currently is not medically stable to d/c.      Medications:  Scheduled: . vitamin C  500 mg Oral Daily  . atorvastatin  40 mg Oral Daily  . baricitinib  4 mg Oral Daily  . chlorhexidine  15 mL Mouth Rinse BID  . Chlorhexidine Gluconate Cloth  6 each Topical Daily  . cholecalciferol  1,000 Units Oral Daily  . diltiazem  60 mg Oral  Q6H  . feeding supplement  237 mL Oral BID BM  . insulin aspart  0-15 Units Subcutaneous Q4H  . insulin glargine  8 Units Subcutaneous Daily  . mouth rinse  15 mL Mouth Rinse q12n4p  . methylPREDNISolone (SOLU-MEDROL) injection  40 mg Intravenous BID  . mometasone-formoterol  2 puff Inhalation BID  . nystatin   Topical TID  . pantoprazole  40 mg Oral BID  . polyethylene glycol  17 g Oral BID  . zinc sulfate  220 mg Oral Daily   Continuous:  NGE:[id number], albuterol, chlorpheniramine-HYDROcodone, guaiFENesin-dextromethorphan, hydrOXYzine, labetalol, ondansetron (ZOFRAN) IV, promethazine, sodium chloride, sodium chloride flush   Objective:  Vital Signs  Vitals:   02/20/20 0340 02/20/20 0749 02/20/20 0842 02/20/20 0930  BP: 126/62 140/68    Pulse: 90 95    Resp: 18 19    Temp: 97.6 F (36.4 C) (!) 97.5 F (36.4 C)  TempSrc: Axillary Axillary    SpO2: 96% 97% 94% 93%  Weight:      Height:        Intake/Output Summary (Last 24 hours) at 02/20/2020 1216 Last data filed at 02/20/2020 1000 Gross per 24 hour  Intake 120 ml  Output --  Net 120 ml   Filed Weights   01/25/2020 2139  Weight: 89.4 kg    General appearance: Awake alert.  In no distress.  Hard of hearing Resp: Improved respiratory effort.  Few crackles at the bases bilaterally.  No wheezing or rhonchi.   Cardio: S1-S2 is irregularly irregular.  No S3-S4.  No rubs murmurs or bruit GI: Abdomen is soft.  Nontender nondistended.  Bowel sounds are present normal.  No masses organomegaly Extremities: No edema.  Able to move her extremities Neurologic:  No focal neurological deficits.       Lab Results:  Data Reviewed: I have personally reviewed following labs and imaging studies  CBC: Recent Labs  Lab 02/14/20 0313 02/17/20 0400 02/20/20 0111  WBC 10.6* 16.4* 16.4*  HGB 12.2 12.0 12.4  HCT 36.0 35.3* 37.6  MCV 90.9 89.8 88.9  PLT 205 192 [id number]    Basic Metabolic Panel: Recent Labs  Lab  02/15/20 0218 02/16/20 0344 02/17/20 0400 02/18/20 0301 02/19/20 0423 02/20/20 0111  NA 142 143 143 139 137 137  K 3.4* 4.1 3.8 4.0 4.2 4.2  CL 103 105 103 101 99 99  CO2 28 29 30 28 28 29   GLUCOSE 195* 214* 214* 175* 141* 176*  BUN 29* 27* 30* 32* 31* 33*  CREATININE 0.70 0.82 0.83 0.73 0.74 0.83  CALCIUM 7.8* 7.9* 7.8* 7.9* 7.8* 7.8*  MG 2.5* 2.5*  --  2.3  --   --   PHOS 3.2  --   --   --   --   --     GFR: Estimated Creatinine Clearance: 65.7 mL/min (by C-G formula based on SCr of 0.83 mg/dL).  Liver Function Tests: Recent Labs  Lab 02/16/20 0344 02/17/20 0400 02/18/20 0301 02/19/20 0423 02/20/20 0111  AST 20 21 19 20 22   ALT 23 24 22 22 27   ALKPHOS 54 55 54 58 62  BILITOT 1.0 1.0 0.9 0.6 1.0  PROT 5.1* 5.0* 5.0* 4.7* 5.1*  ALBUMIN 2.4* 2.4* 2.3* 2.2* 2.3*    CBG: Recent Labs  Lab 02/19/20 1726 02/19/20 1919 02/20/20 0016 02/20/20 0342 02/20/20 0818  GLUCAP 173* 189* 172* 169* 173*      No results found for this or any previous visit (from the past 240 hour(s)).    Radiology Studies: DG Chest Port 1 View  Result Date: 02/20/2020 CLINICAL DATA:  Pneumonia.  COVID. EXAM: PORTABLE CHEST 1 VIEW COMPARISON:  07/16/2020 FINDINGS: RIGHT PICC line unchanged. Normal cardiac silhouette. Fine diffuse airspace disease appears slightly improved compared to prior. Low lung volumes. No pneumothorax. IMPRESSION: 1. Mild improvement in airspace disease. 2. Low lung volumes Electronically Signed   By: Suzy Bouchard M.D.   On: 02/20/2020 08:27       LOS: 12 days   [facility] Pager on [email or web address]  02/20/2020, 12:16 PM

## 2020-02-21 DIAGNOSIS — I4891 Unspecified atrial fibrillation: Secondary | ICD-10-CM | POA: Diagnosis not present

## 2020-02-21 DIAGNOSIS — J9601 Acute respiratory failure with hypoxia: Secondary | ICD-10-CM | POA: Diagnosis not present

## 2020-02-21 DIAGNOSIS — E1165 Type 2 diabetes mellitus with hyperglycemia: Secondary | ICD-10-CM | POA: Diagnosis not present

## 2020-02-21 DIAGNOSIS — U071 COVID-19: Secondary | ICD-10-CM | POA: Diagnosis not present

## 2020-02-21 LAB — BASIC METABOLIC PANEL
Anion gap: 9 (ref 5–15)
BUN: 32 mg/dL — ABNORMAL HIGH (ref 8–23)
CO2: 28 mmol/L (ref 22–32)
Calcium: 7.9 mg/dL — ABNORMAL LOW (ref 8.9–10.3)
Chloride: 97 mmol/L — ABNORMAL LOW (ref 98–111)
Creatinine, Ser: 0.76 mg/dL (ref 0.44–1.00)
GFR, Estimated: >60 mL/min (ref >60)
Glucose, Bld: 164 mg/dL — ABNORMAL HIGH (ref 70–99)
Potassium: 4.1 mmol/L (ref 3.5–5.1)
Sodium: 134 mmol/L — ABNORMAL LOW (ref 135–145)

## 2020-02-21 LAB — GLUCOSE, CAPILLARY
Glucose-Capillary: 132 mg/dL — ABNORMAL HIGH (ref 70–99)
Glucose-Capillary: 136 mg/dL — ABNORMAL HIGH (ref 70–99)
Glucose-Capillary: 139 mg/dL — ABNORMAL HIGH (ref 70–99)
Glucose-Capillary: 151 mg/dL — ABNORMAL HIGH (ref 70–99)
Glucose-Capillary: 152 mg/dL — ABNORMAL HIGH (ref 70–99)
Glucose-Capillary: 157 mg/dL — ABNORMAL HIGH (ref 70–99)

## 2020-02-21 MED ORDER — FUROSEMIDE 10 MG/ML IJ SOLN
40.0000 mg | Freq: Once | INTRAMUSCULAR | Status: AC
  Administered 2020-02-21: 40 mg via INTRAVENOUS
  Filled 2020-02-21: qty 4

## 2020-02-21 NOTE — Progress Notes (Signed)
PROGRESS NOTE  Diane Mendoza NID:[id number] DOB: 06/13/1945 DOA: 02/05/2020  PCP: Pcp, No  Brief History/Interval Summary: 75 year old female with past medical history of essential hypertension, peripheral vascular disease, hyperlipidemia who tested positive for Covid on 12/23 presented on 12/29 with worsening weakness.  Was started on steroids Remdesivir and baricitinib.  Subsequently found to have new onset atrial fibrillation and started on Cardizem infusion.  There was also concern for GI bleeding [facility] gastroenterology was consulted.  No endoscopies were performed.  Over the course of the hospitalization patient has had increasing oxygen requirements.  Transferred to the ICU subsequently.  Reason for Visit: Pneumonia due to COVID-19.  Acute respiratory failure with hypoxia  Consultants: Pulmonology.  [facility]  Procedures: None yet  Antibiotics: Anti-infectives (From admission, onward)   Start     Dose/Rate Route Frequency Ordered Stop   02/11/20 1000  remdesivir 100 mg in sodium chloride 0.9 % 100 mL IVPB        100 mg 200 mL/hr over 30 Minutes Intravenous Daily 01/19/2020 1426 02/13/20 0956   01/30/2020 1730  azithromycin (ZITHROMAX) 500 mg in sodium chloride 0.9 % 250 mL IVPB        500 mg 250 mL/hr over 60 Minutes Intravenous Every 24 hours 01/29/2020 1636 02/14/20 1816   01/25/2020 1730  cefTRIAXone (ROCEPHIN) 2 g in sodium chloride 0.9 % 100 mL IVPB        2 g 200 mL/hr over 30 Minutes Intravenous Every 24 hours 02/03/2020 1636 02/14/20 1802   02/09/20 1000  remdesivir 100 mg in sodium chloride 0.9 % 100 mL IVPB  Status:  Discontinued       "Followed by" Linked Group Details   100 mg 200 mL/hr over 30 Minutes Intravenous Daily 02/01/2020 2020 01/26/2020 1426   02/04/2020 2030  remdesivir 200 mg in sodium chloride 0.9% 250 mL IVPB  Status:  Discontinued       "Followed by" Linked Group Details   200 mg 580 mL/hr over 30 Minutes Intravenous Once 02/09/2020 2020 01/31/2020 1426       Subjective/Interval History: Patient lying comfortably on the bed.  No issues reported by nursing staff.  Patient very hard of hearing.  Denies any shortness of breath or chest pain this morning.     Assessment/Plan:  Acute Hypoxic Resp. Failure/Pneumonia due to COVID-19   Recent Labs  Lab 02/16/20 0344 02/17/20 0400 02/18/20 0301 02/19/20 0423 02/20/20 0111  ALT 23 24 22 22 27      Objective findings: Oxygen requirements: Heated high flow at 40 L/min, 70% FiO2.  Saturations in the late 80s early 90s.     COVID 19 Therapeutics: Antibacterials: She completed a 5-day course of ceftriaxone and azithromycin Remdesivir: Patient completed 5-day course of Remdesivir Steroids: Remains on Solu-Medrol Diuretics: Furosemide has been given the last few days Inhaled Steroids: On Dulera Baricitinib: Remains on baricitinib started on 1/1 PUD Prophylaxis: On PPI DVT Prophylaxis: SCDs only due to GI bleed  From a respiratory standpoint patient remains tenuous.  Subjectively she is feeling better.  Remains on high amounts of oxygen.  She has completed course of Remdesivir.  Remains on steroids and baricitinib.  Chest x-ray done yesterday morning showed slight improvement.  Continue current management for now.  Leukocytosis is thought to be due to steroids.  Continue incentive spirometry and mobilization.  Give additional dose of furosemide today.  The treatment plan and use of medications and known side effects were discussed with patient/family. Some of the medications  used are based on case reports/anecdotal data.  All other medications being used in the management of COVID-19 based on limited study data.  Complete risks and long-term side effects are unknown, however in the best clinical judgment they seem to be of some benefit.  Patient/family wanted to proceed with treatment options provided.  Atrial fibrillation with RVR Was requiring a Cardizem infusion initially. Heart rate was  poorly controlled. She was started on scheduled diltiazem orally.  Heart rate seems to be better controlled.  Continue current dose for now.  TSH noted to be 1.24.  Echocardiogram shows normal systolic function with grade 2 diastolic dysfunction.   No anticoagulation due to concern for GI bleed.  Hyperlipidemia Continue Lipitor.  GI bleed with melanotic stools Gastroenterology was consulted.  No endoscopy was done due to her tenuous respiratory status.  Continue PPI.  No overt bleeding noted.  Hemoglobin has been stable.  Essential hypertension Monitor blood pressure..  Patient on diltiazem.  Blood pressure is reasonably well controlled.  Hyperglycemia HbA1c 6.2.  Elevated CBGs due to steroids.  Continue SSI and Lantus.  Goals of care Conversation held with her daughter and son by critical care medicine.  Palliative care was also following.  Patient changed to DNR.  Hypokalemia Has been corrected.  Obesity Estimated body mass index is 32.78 kg/m as calculated from the following:   Height as of this encounter: 5\' 5"  (1.651 m).   Weight as of this encounter: 89.4 kg.   DVT Prophylaxis: SCDs Code Status: DNR Family Communication: Son being updated every other day. Disposition Plan: Will need to go to SNF when improved.  Status is: Inpatient  Remains inpatient appropriate because:IV treatments appropriate due to intensity of illness or inability to take PO and Inpatient level of care appropriate due to severity of illness   Dispo: The patient is from: Home              Anticipated d/c is to: To be determined              Anticipated d/c date is: 3 days              Patient currently is not medically stable to d/c.      Medications:  Scheduled: . vitamin C  500 mg Oral Daily  . atorvastatin  40 mg Oral Daily  . baricitinib  4 mg Oral Daily  . chlorhexidine  15 mL Mouth Rinse BID  . Chlorhexidine Gluconate Cloth  6 each Topical Daily  . cholecalciferol  1,000 Units  Oral Daily  . diltiazem  60 mg Oral Q6H  . feeding supplement  237 mL Oral BID BM  . insulin aspart  0-15 Units Subcutaneous Q4H  . insulin glargine  8 Units Subcutaneous Daily  . mouth rinse  15 mL Mouth Rinse q12n4p  . methylPREDNISolone (SOLU-MEDROL) injection  40 mg Intravenous BID  . mometasone-formoterol  2 puff Inhalation BID  . nystatin   Topical TID  . pantoprazole  40 mg Oral BID  . polyethylene glycol  17 g Oral BID  . zinc sulfate  220 mg Oral Daily   Continuous:  [id number], albuterol, chlorpheniramine-HYDROcodone, guaiFENesin-dextromethorphan, hydrOXYzine, labetalol, ondansetron (ZOFRAN) IV, promethazine, sodium chloride, sodium chloride flush   Objective:  Vital Signs  Vitals:   02/21/20 0213 02/21/20 0349 02/21/20 0729 02/21/20 0827  BP:  (!) 143/69 139/60   Pulse: (!) 103 97 78   Resp: 14 17 18    Temp:  98.1 F (36.7 C)  98.1 F (36.7 C)   TempSrc:  Oral    SpO2: 93% 92% (!) 89% 90%  Weight:      Height:        Intake/Output Summary (Last 24 hours) at 02/21/2020 1143 Last data filed at 02/21/2020 0011 Gross per 24 hour  Intake --  Output 300 ml  Net -300 ml   Filed Weights   01/27/2020 2139  Weight: 89.4 kg    General appearance: Awake alert.  In no distress.  Pleasantly distracted.  Hard of hearing Resp: Normal effort at rest.  Few crackles bilateral bases.  No wheezing or rhonchi. Cardio: S1-S2 is irregularly irregular.  No S3-S4.  No rubs murmurs or bruit GI: Abdomen is soft.  Nontender nondistended.  Bowel sounds are present normal.  No masses organomegaly Extremities: No edema.  Neurologic:  No focal neurological deficits.        Lab Results:  Data Reviewed: I have personally reviewed following labs and imaging studies  CBC: Recent Labs  Lab 02/17/20 0400 02/20/20 0111  WBC 16.4* 16.4*  HGB 12.0 12.4  HCT 35.3* 37.6  MCV 89.8 88.9  PLT 192 182    Basic Metabolic Panel: Recent Labs  Lab 02/15/20 0218 02/16/20 0344  02/17/20 0400 02/18/20 0301 02/19/20 0423 02/20/20 0111 02/21/20 0225  NA 142 143 143 139 137 137 134*  K 3.4* 4.1 3.8 4.0 4.2 4.2 4.1  CL 103 105 103 101 99 99 97*  CO2 28 29 30 28 28 29 28   GLUCOSE 195* 214* 214* 175* 141* 176* 164*  BUN 29* 27* 30* 32* 31* 33* 32*  CREATININE 0.70 0.82 0.83 0.73 0.74 0.83 0.76  CALCIUM 7.8* 7.9* 7.8* 7.9* 7.8* 7.8* 7.9*  MG 2.5* 2.5*  --  2.3  --   --   --   PHOS 3.2  --   --   --   --   --   --     GFR: Estimated Creatinine Clearance: 68.2 mL/min (by C-G formula based on SCr of 0.76 mg/dL).  Liver Function Tests: Recent Labs  Lab 02/16/20 0344 02/17/20 0400 02/18/20 0301 02/19/20 0423 02/20/20 0111  AST 20 21 19 20 22   ALT 23 24 22 22 27   ALKPHOS 54 55 54 58 62  BILITOT 1.0 1.0 0.9 0.6 1.0  PROT 5.1* 5.0* 5.0* 4.7* 5.1*  ALBUMIN 2.4* 2.4* 2.3* 2.2* 2.3*    CBG: Recent Labs  Lab 02/20/20 1640 02/20/20 1959 02/20/20 2356 02/21/20 0354 02/21/20 0734  GLUCAP 159* 170* 153* 157* 152*      No results found for this or any previous visit (from the past 240 hour(s)).    Radiology Studies: DG Chest Port 1 View  Result Date: 02/20/2020 CLINICAL DATA:  Pneumonia.  COVID. EXAM: PORTABLE CHEST 1 VIEW COMPARISON:  07/16/2020 FINDINGS: RIGHT PICC line unchanged. Normal cardiac silhouette. Fine diffuse airspace disease appears slightly improved compared to prior. Low lung volumes. No pneumothorax. IMPRESSION: 1. Mild improvement in airspace disease. 2. Low lung volumes Electronically Signed   By: Suzy Bouchard M.D.   On: 02/20/2020 08:27       LOS: 13 days   [facility] Pager on [email or web address]  02/21/2020, 11:43 AM

## 2020-02-21 NOTE — Progress Notes (Signed)
Arrived to room for [facility]. Noted lab work already obtained.

## 2020-02-22 DIAGNOSIS — U071 COVID-19: Secondary | ICD-10-CM | POA: Diagnosis not present

## 2020-02-22 LAB — GLUCOSE, CAPILLARY
Glucose-Capillary: 144 mg/dL — ABNORMAL HIGH (ref 70–99)
Glucose-Capillary: 145 mg/dL — ABNORMAL HIGH (ref 70–99)
Glucose-Capillary: 145 mg/dL — ABNORMAL HIGH (ref 70–99)
Glucose-Capillary: 154 mg/dL — ABNORMAL HIGH (ref 70–99)
Glucose-Capillary: 156 mg/dL — ABNORMAL HIGH (ref 70–99)
Glucose-Capillary: 209 mg/dL — ABNORMAL HIGH (ref 70–99)

## 2020-02-22 LAB — BASIC METABOLIC PANEL
Anion gap: 10 (ref 5–15)
BUN: 27 mg/dL — ABNORMAL HIGH (ref 8–23)
CO2: 26 mmol/L (ref 22–32)
Calcium: 7.5 mg/dL — ABNORMAL LOW (ref 8.9–10.3)
Chloride: 97 mmol/L — ABNORMAL LOW (ref 98–111)
Creatinine, Ser: 0.73 mg/dL (ref 0.44–1.00)
GFR, Estimated: >60 mL/min (ref >60)
Glucose, Bld: 156 mg/dL — ABNORMAL HIGH (ref 70–99)
Potassium: 3.9 mmol/L (ref 3.5–5.1)
Sodium: 133 mmol/L — ABNORMAL LOW (ref 135–145)

## 2020-02-22 NOTE — Progress Notes (Addendum)
Page to Dr. Avon Gully: RUE swollen and cool to touch. Cap refill = 3 secs. Endorses 4/10 pain not present this morning. Sensation is intact. PICC inserted 02/11/20. great blood return on all three lumens. Ulnar and radial pulses palpable. Not sure when this swelling started. Was passed on in report this morning.   R: Keep extremity elevated and re-eval this evening.

## 2020-02-22 NOTE — Progress Notes (Signed)
Occupational Therapy Treatment Patient Details Name: Diane Mendoza MRN: [id number] DOB: [birth year] Today's Date: 02/22/2020    History of present illness 75 y.o. female with medical history significant of hypertension, hyperlipidemia, PVD, S/p bil aorto-fem/pop) bypass grafting, GERD, history of cervical cancer COVID unvaccinated  presenting the ED 02/03/2020 with complaints of generalized weakness after testing positive for COVID 3 days ago  symptoms include fevers, chills, cough, shortness of breath, nausea, vomiting, and diarrhea. Admitted for treatment of Sepsis and acute hypoxemic respiratory failure secondary to COVID PNA, Active GI bleed, and new onset of Afib with RVR   OT comments  Pt with slow progress towards OT goals. She is agreeable to working with therapy, but fatigues quickly and is tenuous with minimal activity. Attempted EOB however pt with elevated HR (noted up to 150) and with decrease in O2 sats to 70s, HR decreasing to 120s with increased time sitting but ultimately unable to tolerate sitting up for more than a few min prior to return to supine. Pt engaging in bed level ADL during session as well (via chair egress) given setup/supervision assist. Once returned to supine and repositioned HR returning to the 100s-110s, SpO2 gradually increasing, requiring increased time to recover to the mid 80s (HHFNC 60%, 30L). Continue to recommend post acute therapies at time of discharge (SNF vs LTACH pending ability to wean O2 requirements). Acute OT to follow.    Follow Up Recommendations  SNF    Equipment Recommendations  3 in 1 bedside commode;Other (comment) (TBD in next venue)          Precautions / Restrictions Precautions Precautions: Fall;Other (comment) Precaution Comments: watch SpO2 and HR Restrictions Weight Bearing Restrictions: No       Mobility Bed Mobility Overal bed mobility: Needs Assistance Bed Mobility: Supine to Sit;Sit to Supine     Supine to sit: +2 for  safety/equipment;Min assist Sit to supine: Min assist;+2 for physical assistance   General bed mobility comments: Pt required cues for sequencing and to initiate movements, able to use bed rail to move to edge of bed. assist to scoot hips and for LEs when retrning to supine  Transfers Overall transfer level:  (deferred OOB this session, SPo2 in 70s, HR elevated to 150 bpm, RR 41)               General transfer comment: unable to attempt today    Balance Overall balance assessment: Needs assistance Sitting-balance support: Feet supported;Bilateral upper extremity supported Sitting balance-Leahy Scale: Fair Sitting balance - Comments: close minguard for static balance                                   ADL either performed or assessed with clinical judgement   ADL Overall ADL's : Needs assistance/impaired Eating/Feeding: Set up;Sitting   Grooming: Oral care;Set up;Supervision/safety;Sitting;Bed level Grooming Details (indicate cue type and reason): completed while in chair egress at bed level. pt only agreeable to x1 ADL task                               General ADL Comments: pt only able to tolerate bed mobility (briefly) and additional activity while in chair egress at bed level. pt fatigues very quickly and requires increased time to recver  Cognition Arousal/Alertness: Awake/alert Behavior During Therapy: WFL for tasks assessed/performed Overall Cognitive Status: Difficult to assess Area of Impairment: Safety/judgement;Problem solving                         Safety/Judgement: Decreased awareness of safety   Problem Solving: Slow processing;Difficulty sequencing;Requires verbal cues General Comments: HOH, Pt required cues for mobility and following commands with increased time.        Exercises Exercises: General Upper Extremity;Other exercises General Exercises - Upper Extremity Digit Composite  Flexion: AROM;Both;5 reps Composite Extension: AROM;Both;5 reps General Exercises - Lower Extremity Ankle Circles/Pumps: AROM;Both;10 reps;Supine Short Arc Quad: AROM;Both;5 reps;Supine Other Exercises Other Exercises: reaching/shoulder flex, bil UE, x5 each   Shoulder Instructions       General Comments      Pertinent Vitals/ Pain       Pain Assessment: No/denies pain  Home Living                                          Prior Functioning/Environment              Frequency  Min 3X/week        Progress Toward Goals  OT Goals(current goals can now be found in the care plan section)  Progress towards OT goals: OT to reassess next treatment  Acute Rehab OT Goals Patient Stated Goal: not stated OT Goal Formulation: With patient Time For Goal Achievement: 02/25/20 Potential to Achieve Goals: Good ADL Goals Pt Will Transfer to Toilet: with min guard assist;stand pivot transfer;bedside commode Pt/caregiver will Perform Home Exercise Program: Increased strength;Both right and left upper extremity;With theraband;With written HEP provided Additional ADL Goal #1: Pt will recall and apply at least 3 energy conservation strategies to apply to ADL routine Additional ADL Goal #2: Pt will self monitor and maintain SpO2 >88% during ADL activity  Plan Discharge plan remains appropriate    Co-evaluation                 AM-PAC OT "6 Clicks" Daily Activity     Outcome Measure   Help from another person eating meals?: A Little Help from another person taking care of personal grooming?: A Little Help from another person toileting, which includes using toliet, bedpan, or urinal?: Total Help from another person bathing (including washing, rinsing, drying)?: A Lot Help from another person to put on and taking off regular upper body clothing?: A Lot Help from another person to put on and taking off regular lower body clothing?: Total 6 Click Score: 12     End of Session Equipment Utilized During Treatment: Oxygen  OT Visit Diagnosis: Unsteadiness on feet (R26.81);Other abnormalities of gait and mobility (R26.89);Muscle weakness (generalized) (M62.81)   Activity Tolerance Patient limited by fatigue;Patient tolerated treatment well   Patient Left in bed;with call bell/phone within reach;with bed alarm set   Nurse Communication Mobility status        Time: 1761-6073 OT Time Calculation (min): 12 min  Charges: OT General Charges $OT Visit: 1 Visit OT Treatments $Self Care/Home Management : 8-22 mins  Lou Cal, Hannah Pager [phone] Office [phone]     Raymondo Band 02/22/2020, 4:58 PM

## 2020-02-22 NOTE — Progress Notes (Signed)
Physical Therapy Treatment Patient Details Name: Diane Mendoza MRN: [id number] DOB: 1945/08/26 Today's Date: 02/22/2020    History of Present Illness 75 y.o. female with medical history significant of hypertension, hyperlipidemia, PVD, S/p bil aorto-fem/pop) bypass grafting, GERD, history of cervical cancer COVID unvaccinated  presenting the ED 01/30/2020 with complaints of generalized weakness after testing positive for COVID 3 days ago  symptoms include fevers, chills, cough, shortness of breath, nausea, vomiting, and diarrhea. Admitted for treatment of Sepsis and acute hypoxemic respiratory failure secondary to COVID PNA, Active GI bleed, and new onset of Afib with RVR    PT Comments    Pt supine in bed.  Pt able to move to edge of bed and back to bed with min guard to min assistance.  Upon sitting edge of bed HR elevated between 130-150bpm, SPO2 dropped to 70s, RR increased to 44.  Pt required back to bed when symptoms did not resolve.  Pt reports feeling flushed.  Once back to supine performed limited LE exercises.     Follow Up Recommendations  SNF;Supervision/Assistance - 24 hour (due to O2 requirements.  Will continue to assess.)     Equipment Recommendations  Rolling walker with 5" wheels    Recommendations for Other Services       Precautions / Restrictions Precautions Precautions: Fall;Other (comment) Precaution Comments: watch SPo2 Restrictions Weight Bearing Restrictions: No    Mobility  Bed Mobility Overal bed mobility: Needs Assistance Bed Mobility: Supine to Sit;Sit to Supine     Supine to sit: Min guard;+2 for safety/equipment Sit to supine: Min assist;+2 for physical assistance   General bed mobility comments: Pt required cues for sequencing able to use bed rail to move to edge of bed.  Transfers Overall transfer level:  (deferred OOB this session, SPo2 in 70s, HR elevated to 150 bpm, RR 41)                  Ambulation/Gait                  Stairs             Wheelchair Mobility    Modified Rankin (Stroke Patients Only)       Balance                                            Cognition Arousal/Alertness: Awake/alert Behavior During Therapy: WFL for tasks assessed/performed Overall Cognitive Status: Difficult to assess Area of Impairment: Safety/judgement;Problem solving                         Safety/Judgement: Decreased awareness of safety   Problem Solving: Slow processing;Difficulty sequencing;Requires verbal cues General Comments: HOH, Pt required cues for mobility and following commands with increased time.      Exercises General Exercises - Lower Extremity Ankle Circles/Pumps: AROM;Both;10 reps;Supine Short Arc Quad: AROM;Both;5 reps;Supine    General Comments        Pertinent Vitals/Pain Pain Assessment: No/denies pain    Home Living                      Prior Function            PT Goals (current goals can now be found in the care plan section) Acute Rehab PT Goals Patient Stated Goal: not stated Potential to Achieve Goals:  Fair Progress towards PT goals: Progressing toward goals    Frequency    Min 3X/week      PT Plan Current plan remains appropriate    Co-evaluation              AM-PAC PT "6 Clicks" Mobility   Outcome Measure  Help needed turning from your back to your side while in a flat bed without using bedrails?: A Little Help needed moving from lying on your back to sitting on the side of a flat bed without using bedrails?: A Little Help needed moving to and from a bed to a chair (including a wheelchair)?: Total Help needed standing up from a chair using your arms (e.g., wheelchair or bedside chair)?: Total Help needed to walk in hospital room?: Total Help needed climbing 3-5 steps with a railing? : Total 6 Click Score: 10    End of Session Equipment Utilized During Treatment: Oxygen Activity Tolerance:  Treatment limited secondary to medical complications (Comment) (poor activity tolerance, tachycardia, hypoxia)   Nurse Communication: Mobility status PT Visit Diagnosis: Unsteadiness on feet (R26.81);Other abnormalities of gait and mobility (R26.89);Muscle weakness (generalized) (M62.81);Difficulty in walking, not elsewhere classified (R26.2)     Time: 7858-8502 PT Time Calculation (min) (ACUTE ONLY): 10 min  Charges:  $Therapeutic Activity: 8-22 mins                     Erasmo Leventhal , PTA Acute Rehabilitation Services Pager [phone] Office [phone]     Daphanie Oquendo Eli Hose 02/22/2020, 4:26 PM

## 2020-02-22 NOTE — Progress Notes (Signed)
PROGRESS NOTE  Diane Mendoza KYH:[id number] DOB: 07-Oct-1945 DOA: 01/21/2020  PCP: Pcp, No  Brief History/Interval Summary: 75 year old female with past medical history of essential hypertension, peripheral vascular disease, hyperlipidemia who tested positive for Covid on 12/23 presented on 12/29 with worsening weakness.  Was started on steroids Remdesivir and baricitinib.  Subsequently found to have new onset atrial fibrillation and started on Cardizem infusion.  There was also concern for GI bleeding [facility] gastroenterology was consulted.  No endoscopies were performed.  Over the course of the hospitalization patient has had increasing oxygen requirements.  Transferred to the ICU subsequently.  Reason for Visit: Pneumonia due to COVID-19.  Acute respiratory failure with hypoxia  Consultants: Pulmonology.  [facility]  Procedures: None yet  Antibiotics: Anti-infectives (From admission, onward)   Start     Dose/Rate Route Frequency Ordered Stop   02/11/20 1000  remdesivir 100 mg in sodium chloride 0.9 % 100 mL IVPB        100 mg 200 mL/hr over 30 Minutes Intravenous Daily 02/02/2020 1426 02/13/20 0956   01/16/2020 1730  azithromycin (ZITHROMAX) 500 mg in sodium chloride 0.9 % 250 mL IVPB        500 mg 250 mL/hr over 60 Minutes Intravenous Every 24 hours 02/01/2020 1636 02/14/20 1816   01/13/2020 1730  cefTRIAXone (ROCEPHIN) 2 g in sodium chloride 0.9 % 100 mL IVPB        2 g 200 mL/hr over 30 Minutes Intravenous Every 24 hours 01/11/2020 1636 02/14/20 1802   02/09/20 1000  remdesivir 100 mg in sodium chloride 0.9 % 100 mL IVPB  Status:  Discontinued       "Followed by" Linked Group Details   100 mg 200 mL/hr over 30 Minutes Intravenous Daily 01/19/2020 2020 01/24/2020 1426   01/15/2020 2030  remdesivir 200 mg in sodium chloride 0.9% 250 mL IVPB  Status:  Discontinued       "Followed by" Linked Group Details   200 mg 580 mL/hr over 30 Minutes Intravenous Once 02/02/2020 2020 01/13/2020 1426       Subjective/Interval History: Patient lying comfortably on the bed.  No issues reported by nursing staff.  Patient very hard of hearing.  Denies any shortness of breath or chest pain this morning.     Assessment/Plan:  Acute Hypoxic Resp. Failure/Pneumonia due to COVID-19   Recent Labs  Lab 02/16/20 0344 02/17/20 0400 02/18/20 0301 02/19/20 0423 02/20/20 0111  ALT 23 24 22 22 27      Objective findings: Oxygen requirements: Heated high flow at 40 L/min, 70% FiO2.  Saturations in the late 80s early 90s.     COVID 19 Therapeutics: Antibacterials: She completed a 5-day course of ceftriaxone and azithromycin Remdesivir: Patient completed 5-day course of Remdesivir Steroids: Remains on Solu-Medrol Diuretics: Furosemide has been given PRN over the last few days Inhaled Steroids: On Dulera Baricitinib: Remains on baricitinib started on 1/1 PUD Prophylaxis: On PPI DVT Prophylaxis: SCDs only due to GI bleed  Chest x-ray previously noted to be improving Leukocytosis likely secondary to steroids.   Continue incentive spirometry and mobilization - encouraged proning as tolerated  Atrial fibrillation with RVR, rate controlled Initially required diltiazem drip - transitioned to PO diltiazem TSH noted to be 1.24.   Echocardiogram shows normal systolic function with grade 2 diastolic dysfunction.   No anticoagulation due to concern for GI bleed.  Hyperlipidemia Continue atorvastatin.  GI bleed with melanotic stools, stable Gastroenterology previously consulted.  No endoscopy was done due to  her tenuous respiratory status.  Continue PPI.  No overt bleeding noted.  Hemoglobin has been stable.  Essential hypertension Monitor blood pressure.  Patient on diltiazem.  Blood pressure is reasonably well controlled.  Hyperglycemia HbA1c 6.2.  Elevated CBGs due to steroids.  Continue SSI and Lantus.  Goals of care Conversation held with her daughter and son by critical care medicine.   Palliative care was also following.  Patient changed to DNR.  Hypokalemia Has been corrected.  Obesity Estimated body mass index is 32.78 kg/m as calculated from the following:   Height as of this encounter: 5\' 5"  (1.651 m).   Weight as of this encounter: 89.4 kg.   DVT Prophylaxis: SCDs Code Status: DNR Family Communication: Son being updated every other day. Disposition Plan: SNF  Status is: Inpatient  Remains inpatient appropriate because:IV treatments appropriate due to intensity of illness or inability to take PO and Inpatient level of care appropriate due to severity of illness   Dispo: The patient is from: Home              Anticipated d/c is to: To be determined              Anticipated d/c date is: 3 days              Patient currently is not medically stable to d/c.  Medications:  Scheduled: . vitamin C  500 mg Oral Daily  . atorvastatin  40 mg Oral Daily  . baricitinib  4 mg Oral Daily  . chlorhexidine  15 mL Mouth Rinse BID  . Chlorhexidine Gluconate Cloth  6 each Topical Daily  . cholecalciferol  1,000 Units Oral Daily  . diltiazem  60 mg Oral Q6H  . feeding supplement  237 mL Oral BID BM  . insulin aspart  0-15 Units Subcutaneous Q4H  . insulin glargine  8 Units Subcutaneous Daily  . mouth rinse  15 mL Mouth Rinse q12n4p  . methylPREDNISolone (SOLU-MEDROL) injection  40 mg Intravenous BID  . mometasone-formoterol  2 puff Inhalation BID  . nystatin   Topical TID  . pantoprazole  40 mg Oral BID  . polyethylene glycol  17 g Oral BID  . zinc sulfate  220 mg Oral Daily   Continuous:  WRU:[id number], albuterol, chlorpheniramine-HYDROcodone, guaiFENesin-dextromethorphan, hydrOXYzine, labetalol, ondansetron (ZOFRAN) IV, promethazine, sodium chloride, sodium chloride flush   Objective:  Vital Signs  Vitals:   02/22/20 0021 02/22/20 0116 02/22/20 0407 02/22/20 0503  BP:  (!) 120/53 125/67   Pulse: 88 85 85 94  Resp: 19 20 20 16   Temp:   98 F (36.7  C)   TempSrc:   Axillary   SpO2: 92%  90% 90%  Weight:      Height:        Intake/Output Summary (Last 24 hours) at 02/22/2020 0701 Last data filed at 02/21/2020 2100 Gross per 24 hour  Intake 30 ml  Output 1200 ml  Net -1170 ml   Filed Weights   02/02/2020 2139  Weight: 89.4 kg    General appearance: Awake alert.  In no distress.  Pleasantly distracted.  Hard of hearing Resp: Normal effort at rest.  Few crackles bilateral bases.  No wheezing or rhonchi. Cardio: S1-S2 is irregularly irregular.  No S3-S4.  No rubs murmurs or bruit GI: Abdomen is soft.  Nontender nondistended.  Bowel sounds are present normal.  No masses organomegaly Extremities: No edema.  Neurologic:  No focal neurological deficits.  Lab Results:  Data Reviewed: I have personally reviewed following labs and imaging studies  CBC: Recent Labs  Lab 02/17/20 0400 02/20/20 0111  WBC 16.4* 16.4*  HGB 12.0 12.4  HCT 35.3* 37.6  MCV 89.8 88.9  PLT 192 [id number]    Basic Metabolic Panel: Recent Labs  Lab 02/16/20 0344 02/17/20 0400 02/18/20 0301 02/19/20 0423 02/20/20 0111 02/21/20 0225 02/22/20 0047  NA 143   < > 139 137 137 134* 133*  K 4.1   < > 4.0 4.2 4.2 4.1 3.9  CL 105   < > 101 99 99 97* 97*  CO2 29   < > 28 28 29 28 26   GLUCOSE 214*   < > 175* 141* 176* 164* 156*  BUN 27*   < > 32* 31* 33* 32* 27*  CREATININE 0.82   < > 0.73 0.74 0.83 0.76 0.73  CALCIUM 7.9*   < > 7.9* 7.8* 7.8* 7.9* 7.5*  MG 2.5*  --  2.3  --   --   --   --    < > = values in this interval not displayed.    GFR: Estimated Creatinine Clearance: 68.2 mL/min (by C-G formula based on SCr of 0.73 mg/dL).  Liver Function Tests: Recent Labs  Lab 02/16/20 0344 02/17/20 0400 02/18/20 0301 02/19/20 0423 02/20/20 0111  AST 20 21 19 20 22   ALT 23 24 22 22 27   ALKPHOS 54 55 54 58 62  BILITOT 1.0 1.0 0.9 0.6 1.0  PROT 5.1* 5.0* 5.0* 4.7* 5.1*  ALBUMIN 2.4* 2.4* 2.3* 2.2* 2.3*    CBG: Recent Labs  Lab  02/21/20 1214 02/21/20 1621 02/21/20 2004 02/21/20 2348 02/22/20 0410  GLUCAP 132* 151* 136* 139* 145*      No results found for this or any previous visit (from the past 240 hour(s)).    Radiology Studies: DG Chest Port 1 View  Result Date: 02/20/2020 CLINICAL DATA:  Pneumonia.  COVID. EXAM: PORTABLE CHEST 1 VIEW COMPARISON:  07/16/2020 FINDINGS: RIGHT PICC line unchanged. Normal cardiac silhouette. Fine diffuse airspace disease appears slightly improved compared to prior. Low lung volumes. No pneumothorax. IMPRESSION: 1. Mild improvement in airspace disease. 2. Low lung volumes Electronically Signed   By: Suzy Bouchard M.D.   On: 02/20/2020 08:27       LOS: 14 days   Diane Mendoza  [facility] Pager: Secure chat/epic messenger  02/22/2020, 7:01 AM

## 2020-02-23 LAB — BASIC METABOLIC PANEL
Anion gap: 10 (ref 5–15)
BUN: 30 mg/dL — ABNORMAL HIGH (ref 8–23)
CO2: 27 mmol/L (ref 22–32)
Calcium: 7.7 mg/dL — ABNORMAL LOW (ref 8.9–10.3)
Chloride: 96 mmol/L — ABNORMAL LOW (ref 98–111)
Creatinine, Ser: 0.78 mg/dL (ref 0.44–1.00)
GFR, Estimated: >60 mL/min (ref >60)
Glucose, Bld: 183 mg/dL — ABNORMAL HIGH (ref 70–99)
Potassium: 3.8 mmol/L (ref 3.5–5.1)
Sodium: 133 mmol/L — ABNORMAL LOW (ref 135–145)

## 2020-02-23 LAB — GLUCOSE, CAPILLARY
Glucose-Capillary: 160 mg/dL — ABNORMAL HIGH (ref 70–99)
Glucose-Capillary: 155 mg/dL — ABNORMAL HIGH (ref 70–99)
Glucose-Capillary: 214 mg/dL — ABNORMAL HIGH (ref 70–99)
Glucose-Capillary: 180 mg/dL — ABNORMAL HIGH (ref 70–99)
Glucose-Capillary: 130 mg/dL — ABNORMAL HIGH (ref 70–99)

## 2020-02-23 LAB — CBC
HCT: 38.3 % (ref 36.0–46.0)
Hemoglobin: 12.7 g/dL (ref 12.0–15.0)
MCH: 29 pg (ref 26.0–34.0)
MCHC: 33.2 g/dL (ref 30.0–36.0)
MCV: 87.4 fL (ref 80.0–100.0)
Platelets: 235 10*3/uL (ref 150–400)
RBC: 4.38 MIL/uL (ref 3.87–5.11)
RDW: 12.4 % (ref 11.5–15.5)
WBC: 17 10*3/uL — ABNORMAL HIGH (ref 4.0–10.5)
nRBC: 0 % (ref 0.0–0.2)

## 2020-02-23 MED ORDER — METHYLPREDNISOLONE SODIUM SUCC 40 MG IJ SOLR
40.0000 mg | Freq: Every day | INTRAMUSCULAR | Status: DC
  Administered 2020-02-24: 40 mg via INTRAVENOUS
  Filled 2020-02-23: qty 1

## 2020-02-23 NOTE — Progress Notes (Signed)
PROGRESS NOTE  Diane Mendoza KVQ:[id number] DOB: 21-Dec-1945 DOA: 01/21/2020  PCP: Pcp, No  Brief History/Interval Summary: 75 year old female with past medical history of essential hypertension, peripheral vascular disease, hyperlipidemia who tested positive for Covid on 12/23 presented on 12/29 with worsening weakness.  Was started on steroids Remdesivir and baricitinib.  Subsequently found to have new onset atrial fibrillation and started on Cardizem infusion.  There was also concern for GI bleeding [facility] gastroenterology was consulted.  No endoscopies were performed.  Over the course of the hospitalization patient has had increasing oxygen requirements.  Transferred to the ICU subsequently.  Reason for Visit: Pneumonia due to COVID-19.  Acute respiratory failure with hypoxia  Consultants: Pulmonology.  [facility]  Procedures: None yet  Antibiotics: Anti-infectives (From admission, onward)   Start     Dose/Rate Route Frequency Ordered Stop   02/11/20 1000  remdesivir 100 mg in sodium chloride 0.9 % 100 mL IVPB        100 mg 200 mL/hr over 30 Minutes Intravenous Daily 02/24/2020 1426 02/13/20 0956   Feb 12, 2020 1730  azithromycin (ZITHROMAX) 500 mg in sodium chloride 0.9 % 250 mL IVPB        500 mg 250 mL/hr over 60 Minutes Intravenous Every 24 hours 02/14/2020 1636 02/14/20 1816   02/11/2020 1730  cefTRIAXone (ROCEPHIN) 2 g in sodium chloride 0.9 % 100 mL IVPB        2 g 200 mL/hr over 30 Minutes Intravenous Every 24 hours 03/04/2020 1636 02/14/20 1802   02/09/20 1000  remdesivir 100 mg in sodium chloride 0.9 % 100 mL IVPB  Status:  Discontinued       "Followed by" Linked Group Details   100 mg 200 mL/hr over 30 Minutes Intravenous Daily 01/25/2020 2020 03/02/2020 1426   01/25/2020 2030  remdesivir 200 mg in sodium chloride 0.9% 250 mL IVPB  Status:  Discontinued       "Followed by" Linked Group Details   200 mg 580 mL/hr over 30 Minutes Intravenous Once 01/11/2020 2020 February 12, 2020 1426       Subjective/Interval History: Patient lying comfortably on the bed.  No issues reported by nursing staff.  Patient very hard of hearing.  Denies any shortness of breath or chest pain this morning.     Assessment/Plan:  Acute Hypoxic Resp. Failure/Pneumonia due to COVID-19   Recent Labs  Lab 02/17/20 0400 02/18/20 0301 02/19/20 0423 02/20/20 0111  ALT 24 22 22 27      Objective findings: Oxygen requirements: SpO2: 90 % O2 Flow Rate (L/min): 25 L/min FiO2 (%): 50 %     COVID 19 Therapeutics: Antibacterials: She completed a 5-day course of ceftriaxone and azithromycin Remdesivir: Patient completed 5-day course of Remdesivir Steroids: Remains on Solu-Medrol Diuretics: Furosemide has been given PRN over the last few days Inhaled Steroids: On Dulera Baricitinib: Remains on baricitinib started on 1/1 PUD Prophylaxis: On PPI DVT Prophylaxis: SCDs only due to GI bleed  Chest x-ray previously noted to be improving Leukocytosis likely secondary to steroids.   Continue incentive spirometry and mobilization - encouraged proning as tolerated  Atrial fibrillation with RVR, rate controlled Initially required diltiazem drip - transitioned to PO diltiazem TSH noted to be 1.24.   Echocardiogram shows normal systolic function with grade 2 diastolic dysfunction.   No anticoagulation due to concern for GI bleed.  Hyperlipidemia Continue atorvastatin.  GI bleed with melanotic stools, stable Gastroenterology previously consulted.  No endoscopy was done due to her tenuous respiratory status.  Continue  PPI.  No overt bleeding noted.  Hemoglobin has been stable.  Essential hypertension Monitor blood pressure.  Patient on diltiazem.  Blood pressure is reasonably well controlled.  Hyperglycemia HbA1c 6.2.  Elevated CBGs due to steroids.  Continue SSI and Lantus.  Goals of care Conversation held with her daughter and son by critical care medicine.  Palliative care was also  following.  Patient changed to DNR.  Hypokalemia Has been corrected.  Obesity Estimated body mass index is 32.78 kg/m as calculated from the following:   Height as of this encounter: 5\' 5"  (1.651 m).   Weight as of this encounter: 89.4 kg.   DVT Prophylaxis: SCDs Code Status: DNR Family Communication: Voicemail left for son [name], no answer. Disposition Plan: SNF  Status is: Inpatient  Remains inpatient appropriate because:IV treatments appropriate due to intensity of illness or inability to take PO and Inpatient level of care appropriate due to severity of illness   Dispo: The patient is from: Home              Anticipated d/c is to: To be determined - likely SNF              Anticipated d/c date is: 3 days              Patient currently is not medically stable to d/c.  Medications:  Scheduled: . vitamin C  500 mg Oral Daily  . atorvastatin  40 mg Oral Daily  . baricitinib  4 mg Oral Daily  . chlorhexidine  15 mL Mouth Rinse BID  . Chlorhexidine Gluconate Cloth  6 each Topical Daily  . cholecalciferol  1,000 Units Oral Daily  . diltiazem  60 mg Oral Q6H  . feeding supplement  237 mL Oral BID BM  . insulin aspart  0-15 Units Subcutaneous Q4H  . insulin glargine  8 Units Subcutaneous Daily  . mouth rinse  15 mL Mouth Rinse q12n4p  . methylPREDNISolone (SOLU-MEDROL) injection  40 mg Intravenous BID  . mometasone-formoterol  2 puff Inhalation BID  . nystatin   Topical TID  . pantoprazole  40 mg Oral BID  . polyethylene glycol  17 g Oral BID  . zinc sulfate  220 mg Oral Daily   Continuous:  [id number], albuterol, chlorpheniramine-HYDROcodone, guaiFENesin-dextromethorphan, hydrOXYzine, labetalol, ondansetron (ZOFRAN) IV, promethazine, sodium chloride, sodium chloride flush   Objective:  Vital Signs  Vitals:   02/22/20 2341 02/23/20 0107 02/23/20 0406 02/23/20 0738  BP: 137/63  126/62 (!) 132/56  Pulse: 72 91 87 77  Resp: 20 18 20 20   Temp: 98 F (36.7  C)  97.7 F (36.5 C) 98.3 F (36.8 C)  TempSrc:   Oral Oral  SpO2: (!) 87% 90% 91% (!) 88%  Weight:      Height:        Intake/Output Summary (Last 24 hours) at 02/23/2020 0757 Last data filed at 02/23/2020 0529 Gross per 24 hour  Intake 240 ml  Output 600 ml  Net -360 ml   Filed Weights   01/13/2020 2139  Weight: 89.4 kg    General appearance: Awake alert.  In no distress.  Pleasantly distracted.  Hard of hearing Resp: Normal effort at rest.  Few crackles bilateral bases.  No wheezing or rhonchi. Cardio: S1-S2 is irregularly irregular.  No S3-S4.  No rubs murmurs or bruit GI: Abdomen is soft.  Nontender nondistended.  Bowel sounds are present normal.  No masses organomegaly Extremities: No edema.  Neurologic:  No focal neurological  deficits.        Lab Results:  Data Reviewed: I have personally reviewed following labs and imaging studies  CBC: Recent Labs  Lab 02/17/20 0400 02/20/20 0111  WBC 16.4* 16.4*  HGB 12.0 12.4  HCT 35.3* 37.6  MCV 89.8 88.9  PLT 192 540    Basic Metabolic Panel: Recent Labs  Lab 02/18/20 0301 02/19/20 0423 02/20/20 0111 02/21/20 0225 02/22/20 0047  NA 139 137 137 134* 133*  K 4.0 4.2 4.2 4.1 3.9  CL 101 99 99 97* 97*  CO2 28 28 29 28 26   GLUCOSE 175* 141* 176* 164* 156*  BUN 32* 31* 33* 32* 27*  CREATININE 0.73 0.74 0.83 0.76 0.73  CALCIUM 7.9* 7.8* 7.8* 7.9* 7.5*  MG 2.3  --   --   --   --     GFR: Estimated Creatinine Clearance: 68.2 mL/min (by C-G formula based on SCr of 0.73 mg/dL).  Liver Function Tests: Recent Labs  Lab 02/17/20 0400 02/18/20 0301 02/19/20 0423 02/20/20 0111  AST 21 19 20 22   ALT 24 22 22 27   ALKPHOS 55 54 58 62  BILITOT 1.0 0.9 0.6 1.0  PROT 5.0* 5.0* 4.7* 5.1*  ALBUMIN 2.4* 2.3* 2.2* 2.3*    CBG: Recent Labs  Lab 02/22/20 1611 02/22/20 1946 02/22/20 2342 02/23/20 0406 02/23/20 0743  GLUCAP 154* 209* 145* 160* 155*      No results found for this or any previous visit  (from the past 240 hour(s)).    Radiology Studies: No results found.     LOS: 15 days   Little Ishikawa  [facility] Pager: Secure chat/epic messenger  02/23/2020, 7:57 AM

## 2020-02-23 NOTE — Progress Notes (Signed)
Occupational Therapy Treatment Patient Details Name: Diane Mendoza MRN: [id number] DOB: 12/03/1945 Today's Date: 02/23/2020    History of present illness 75 y.o. female with medical history significant of hypertension, hyperlipidemia, PVD, S/p bil aorto-fem/pop) bypass grafting, GERD, history of cervical cancer COVID unvaccinated  presenting the ED 01/25/2020 with complaints of generalized weakness after testing positive for COVID 3 days ago  symptoms include fevers, chills, cough, shortness of breath, nausea, vomiting, and diarrhea. Admitted for treatment of Sepsis and acute hypoxemic respiratory failure secondary to COVID PNA, Active GI bleed, and new onset of Afib with RVR   OT comments  Pt only tolerated HOB to 50* and unresisted AROM of bil. UEs x 10 reps with HR 124-160, and Sp02 83-86%.  After HOB elevated for ~8 mins Sp02 recovered to 88-91 and HR 108-128.    Coached her on controlled breathing.  She will likely benefit from short sessions of activity, HOB up, lifted to recliner, exercise, etc, multiple times throughout the day as her tolerance is very limited.  Goals downgraded.   Follow Up Recommendations  SNF;LTACH    Equipment Recommendations  3 in 1 bedside commode;Other (comment)    Recommendations for Other Services      Precautions / Restrictions Precautions Precautions: Fall;Other (comment) Precaution Comments: watch SpO2 and HR       Mobility Bed Mobility               General bed mobility comments: pt refused due to fear of fatigue  Transfers                 General transfer comment: pt refused    Balance                                           ADL either performed or assessed with clinical judgement   ADL Overall ADL's : Needs assistance/impaired Eating/Feeding: Set up;Sitting                                     General ADL Comments: Pt refused attempts OOB or EOB     Vision       Perception     Praxis       Cognition Arousal/Alertness: Awake/alert Behavior During Therapy: Flat affect Overall Cognitive Status: Difficult to assess                                 General Comments: Pt is significantly HOH        Exercises General Exercises - Upper Extremity Shoulder Flexion: AROM;Both;10 reps;Supine (HOB 50*) Other Exercises Other Exercises: Pt coaxed on controlled breathing techniuqes Other Exercises: Pt would not agree to leaving [facility] at 50* citing back pain.  Instructed her to move [facility] up frequently throughout the day as the goal is to build upright tolerance.  Also instructed her to perform AROM bil. shoulder flexion at least 2 times each commercial break - she verbalized understanding.   Shoulder Instructions       General Comments Pt refused attempts to move her to the EOB stating she needed to take things slow and moving to EOB makes her dizzy.  Instructed her of the need for mobility with no avail.  She was agreeable to moving  into partial chair position and UE exercise. with coaxing, pt agreed to [facility] to 50* and tolerated it for ~15 mins.  when HOB increased to 50*, HR increased to 124-16- with Sp02 83-86% and required ~6 mins to recover with Sp02 88-91% and HR in the 120s.    Pertinent Vitals/ Pain       Pain Assessment: No/denies pain  Home Living                                          Prior Functioning/Environment              Frequency  Min 3X/week        Progress Toward Goals  OT Goals(current goals can now be found in the care plan section)  Progress towards OT goals: Not progressing toward goals - comment;Goals drowngraded-see care plan  Acute Rehab OT Goals Patient Stated Goal: to be able to do more OT Goal Formulation: With patient Time For Goal Achievement: 03/08/20 Potential to Achieve Goals: [employer] Discharge plan remains appropriate    Co-evaluation                 AM-PAC OT "6 Clicks" Daily  Activity     Outcome Measure   Help from another person eating meals?: A Little Help from another person taking care of personal grooming?: A Little Help from another person toileting, which includes using toliet, bedpan, or urinal?: Total Help from another person bathing (including washing, rinsing, drying)?: A Lot Help from another person to put on and taking off regular upper body clothing?: Total Help from another person to put on and taking off regular lower body clothing?: Total 6 Click Score: 11    End of Session Equipment Utilized During Treatment: Oxygen  OT Visit Diagnosis: Unsteadiness on feet (R26.81);Other abnormalities of gait and mobility (R26.89);Muscle weakness (generalized) (M62.81)   Activity Tolerance Patient limited by fatigue;Patient tolerated treatment well   Patient Left in bed   Nurse Communication Mobility status        Time: 1287-8676 OT Time Calculation (min): 26 min  Charges: OT General Charges $OT Visit: 1 Visit OT Treatments $Therapeutic Activity: 23-37 mins  Nilsa Nutting OTR/L Acute Rehabilitation Services Pager [phone] Office [phone]    Lucille Passy M 02/23/2020, 4:42 PM

## 2020-02-23 NOTE — Plan of Care (Signed)

## 2020-02-24 LAB — GLUCOSE, CAPILLARY
Glucose-Capillary: 125 mg/dL — ABNORMAL HIGH (ref 70–99)
Glucose-Capillary: 134 mg/dL — ABNORMAL HIGH (ref 70–99)
Glucose-Capillary: 153 mg/dL — ABNORMAL HIGH (ref 70–99)
Glucose-Capillary: 167 mg/dL — ABNORMAL HIGH (ref 70–99)
Glucose-Capillary: 178 mg/dL — ABNORMAL HIGH (ref 70–99)
Glucose-Capillary: 214 mg/dL — ABNORMAL HIGH (ref 70–99)
Glucose-Capillary: 223 mg/dL — ABNORMAL HIGH (ref 70–99)

## 2020-02-24 NOTE — Progress Notes (Signed)
Physical Therapy Treatment Patient Details Name: Diane Mendoza MRN: [id number] DOB: 05-Dec-1945 Today's Date: 02/24/2020    History of Present Illness Pt is a 75 y.o. female who tested (+) COVID-19 on 02/02/20, now admitted February 09, 2020 with worsening weakness, new onset afib with RVR, GIB. Workup for sepsis, acute hypoxic respiratory failure due to COVID-19 PNA. Pt with worsening hypoxia requiring transfer to ICU. PMH includes HTN, PVD, cervical CA, arthritis.   PT Comments    Pt slowly progressing with mobility. Tolerated brief bout of sitting EOB activity, as well as supine LE therex; HR up to 160s, SpO2 down to 76% on 20L O2 HHFNC at 100% FiO2. Pt reports, "That wasn't as bad as yesterday." Pt declined additional mobility attempts once vitals recovered secondary to fatigue. Continue to recommend SNF-level therapies to maximize functional mobility and independence prior to return home.   Follow Up Recommendations  SNF;Supervision/Assistance - 24 hour     Equipment Recommendations  Rolling walker with 5" wheels    Recommendations for Other Services       Precautions / Restrictions Precautions Precautions: Fall;Other (comment) Precaution Comments: watch SpO2 and HR Restrictions Weight Bearing Restrictions: No    Mobility  Bed Mobility Overal bed mobility: Needs Assistance Bed Mobility: Supine to Sit;Sit to Supine     Supine to sit: Supervision;HOB elevated Sit to supine: Supervision;HOB elevated   General bed mobility comments: Use of bed rail and HOB elevated for supine<>sit, increased time and effort. Pt sliding up in bed well when tilted down so pt can reach bed rail to pull  Transfers                 General transfer comment: Pt declined  Ambulation/Gait                 Stairs             Wheelchair Mobility    Modified Rankin (Stroke Patients Only)       Balance Overall balance assessment: Needs assistance Sitting-balance support: Feet  supported;Bilateral upper extremity supported Sitting balance-Leahy Scale: Fair                                      Cognition Arousal/Alertness: Awake/alert Behavior During Therapy: Flat affect Overall Cognitive Status: No family/caregiver present to determine baseline cognitive functioning Area of Impairment: Safety/judgement;Problem solving                         Safety/Judgement: Decreased awareness of deficits   Problem Solving: Requires verbal cues General Comments: Still HOH, but seemed to be hearing conversation better today. Answering questions and following commands appropriately. Still some slowed processing and decreased insight into deficits      Exercises General Exercises - Lower Extremity Ankle Circles/Pumps: AROM;Both;Supine Heel Slides: AROM;Both;Supine Straight Leg Raises: AROM;Both;Supine    General Comments General comments (skin integrity, edema, etc.): At rest, SpO2 92% on 20L O2 HHFNC at 100% FiO2, HR 111. Sitting EOB, HR up to 160s SpO2 down to 76%. Pt tolerated ~5-min sitting EOB. At end of session, pt supine (only agreeable to [facility] elevated 36') and HR down to 96, SpO2 88%      Pertinent Vitals/Pain Pain Assessment: No/denies pain    Home Living                      Prior Function  PT Goals (current goals can now be found in the care plan section) Acute Rehab PT Goals Patient Stated Goal: to be able to do more PT Goal Formulation: With patient Time For Goal Achievement: 03/09/20 Potential to Achieve Goals: Fair Progress towards PT goals: Progressing toward goals    Frequency    Min 2X/week      PT Plan Current plan remains appropriate    Co-evaluation              AM-PAC PT "6 Clicks" Mobility   Outcome Measure  Help needed turning from your back to your side while in a flat bed without using bedrails?: A Little Help needed moving from lying on your back to sitting on the side of  a flat bed without using bedrails?: A Little Help needed moving to and from a bed to a chair (including a wheelchair)?: A Lot Help needed standing up from a chair using your arms (e.g., wheelchair or bedside chair)?: A Lot Help needed to walk in hospital room?: A Lot Help needed climbing 3-5 steps with a railing? : Total 6 Click Score: 13    End of Session Equipment Utilized During Treatment: Oxygen Activity Tolerance: Treatment limited secondary to medical complications (Comment);Patient limited by fatigue Patient left: in bed;with call bell/phone within reach;with bed alarm set Nurse Communication: Mobility status PT Visit Diagnosis: Unsteadiness on feet (R26.81);Other abnormalities of gait and mobility (R26.89);Muscle weakness (generalized) (M62.81);Difficulty in walking, not elsewhere classified (R26.2)     Time: 7341-9379 PT Time Calculation (min) (ACUTE ONLY): 20 min  Charges:  $Therapeutic Exercise: 8-22 mins                    Mabeline Caras, PT, DPT Acute Rehabilitation Services  Pager [phone] Office [place] 02/24/2020, 5:48 PM

## 2020-02-24 NOTE — Progress Notes (Addendum)
PROGRESS NOTE  Diane Mendoza OJJ:[id number] DOB: 1945-05-17 DOA: 02/24/2020  PCP: Pcp, No  Brief History/Interval Summary: 75 year old female with past medical history of essential hypertension, peripheral vascular disease, hyperlipidemia who tested positive for Covid on 12/23 presented on 12/29 with worsening weakness.  Was started on steroids Remdesivir and baricitinib.  Subsequently found to have new onset atrial fibrillation and started on Cardizem infusion.  There was also concern for GI bleeding [facility] gastroenterology was consulted.  No endoscopies were performed.  Over the course of the hospitalization patient has had increasing oxygen requirements.  Transferred to the ICU subsequently - oxygen downtrending, back on the floor - ultimate disposition likely SNF.  Reason for Visit: Pneumonia due to COVID-19.  Acute respiratory failure with hypoxia  Consultants: Pulmonology.  [facility]  Procedures: None yet  Antibiotics: Anti-infectives (From admission, onward)   Start     Dose/Rate Route Frequency Ordered Stop   02/11/20 1000  remdesivir 100 mg in sodium chloride 0.9 % 100 mL IVPB        100 mg 200 mL/hr over 30 Minutes Intravenous Daily 02/03/2020 1426 02/13/20 0956   01/12/2020 1730  azithromycin (ZITHROMAX) 500 mg in sodium chloride 0.9 % 250 mL IVPB        500 mg 250 mL/hr over 60 Minutes Intravenous Every 24 hours 01/24/2020 1636 02/14/20 1816   02/01/2020 1730  cefTRIAXone (ROCEPHIN) 2 g in sodium chloride 0.9 % 100 mL IVPB        2 g 200 mL/hr over 30 Minutes Intravenous Every 24 hours 01/26/2020 1636 02/14/20 1802   02/09/20 1000  remdesivir 100 mg in sodium chloride 0.9 % 100 mL IVPB  Status:  Discontinued       "Followed by" Linked Group Details   100 mg 200 mL/hr over 30 Minutes Intravenous Daily 01-Mar-2020 2020 02/09/2020 1426   Mar 01, 2020 2030  remdesivir 200 mg in sodium chloride 0.9% 250 mL IVPB  Status:  Discontinued       "Followed by" Linked Group Details   200  mg 580 mL/hr over 30 Minutes Intravenous Once 02/13/2020 2020 02/06/2020 1426      Subjective/Interval History: Patient lying comfortably on the bed.  No issues reported by nursing staff.  Patient very hard of hearing.  Denies any shortness of breath or chest pain this morning. Generally feels like she is improving slowly - still very weak and moderately dyspneic with minimal exertion.  Assessment/Plan:  Acute Hypoxic Resp. Failure/Pneumonia due to COVID-19 Antibacterials: She completed a 5-day course of ceftriaxone and azithromycin Remdesivir: Patient completed 5-day course of Remdesivir Steroids: Remains on Solu-Medrol Diuretics: Furosemide has been given PRN over the last few days Inhaled Steroids: On Dulera Baricitinib: 1/1-1/15 PUD Prophylaxis: On PPI DVT Prophylaxis: SCDs only due to GI bleed Chest x-ray previously noted to be improving Leukocytosis likely secondary to steroids.   Continue incentive spirometry and mobilization - encouraged proning as tolerated  Atrial fibrillation with RVR, rate controlled Initially required diltiazem drip - transitioned to PO diltiazem TSH noted to be 1.24.   Echocardiogram shows normal systolic function with grade 2 diastolic dysfunction.   No anticoagulation due to concern for GI bleed.  Hyperlipidemia Continue atorvastatin.  GI bleed with melanotic stools, stable Gastroenterology previously consulted.  No endoscopy was done due to her tenuous respiratory status.  Continue PPI.  No overt bleeding noted.  Hemoglobin has been stable.  Essential hypertension Monitor blood pressure.  Patient on diltiazem.  Blood pressure is reasonably well controlled.  Hyperglycemia HbA1c 6.2.  Elevated CBGs due to steroids.  Continue SSI and Lantus.  Goals of care Conversation held with her daughter and son by critical care medicine.  Palliative care was also following.  Patient changed to DNR.  Hypokalemia Has been corrected.  Obesity Estimated body  mass index is 32.78 kg/m as calculated from the following:   Height as of this encounter: 5\' 5"  (1.651 m).   Weight as of this encounter: 89.4 kg.   DVT Prophylaxis: SCDs Code Status: DNR Family Communication: Voicemail left for son [name], no answer. Disposition Plan: SNF  Status is: Inpatient  Remains inpatient appropriate because:IV treatments appropriate due to intensity of illness or inability to take PO and Inpatient level of care appropriate due to severity of illness   Dispo: The patient is from: Home              Anticipated d/c is to: To be determined - likely SNF              Anticipated d/c date is: 3 days              Patient currently is not medically stable to d/c.  Medications:  Scheduled: . vitamin C  500 mg Oral Daily  . atorvastatin  40 mg Oral Daily  . baricitinib  4 mg Oral Daily  . chlorhexidine  15 mL Mouth Rinse BID  . Chlorhexidine Gluconate Cloth  6 each Topical Daily  . cholecalciferol  1,000 Units Oral Daily  . diltiazem  60 mg Oral Q6H  . feeding supplement  237 mL Oral BID BM  . insulin aspart  0-15 Units Subcutaneous Q4H  . insulin glargine  8 Units Subcutaneous Daily  . mouth rinse  15 mL Mouth Rinse q12n4p  . methylPREDNISolone (SOLU-MEDROL) injection  40 mg Intravenous Daily  . mometasone-formoterol  2 puff Inhalation BID  . nystatin   Topical TID  . pantoprazole  40 mg Oral BID  . polyethylene glycol  17 g Oral BID  . zinc sulfate  220 mg Oral Daily   Continuous:  WIO:[id number], albuterol, chlorpheniramine-HYDROcodone, guaiFENesin-dextromethorphan, hydrOXYzine, labetalol, ondansetron (ZOFRAN) IV, promethazine, sodium chloride, sodium chloride flush   Objective:  Vital Signs  Vitals:   02/24/20 0321 02/24/20 0506 02/24/20 0600 02/24/20 0719  BP: (!) 129/59   120/67  Pulse: 85   81  Resp: 20   12  Temp: 98 F (36.7 C)   99 F (37.2 C)  TempSrc: Axillary   Oral  SpO2: (!) 88% 90% 90% 90%  Weight:      Height:         Intake/Output Summary (Last 24 hours) at 02/24/2020 0722 Last data filed at 02/24/2020 0640 Gross per 24 hour  Intake 360 ml  Output 550 ml  Net -190 ml   Filed Weights   01/26/2020 2139  Weight: 89.4 kg    General appearance: Awake alert.  In no distress. Hard of hearing Resp: Normal effort at rest.  Diminished breath sounds without over wheezing, rales, or rhonchi. Cardio: S1-S2 is irregularly irregular.  No S3-S4.  No rubs murmurs or bruit GI: Abdomen is soft.  Nontender nondistended.  Bowel sounds are present normal.  No masses organomegaly Extremities: No edema.  Neurologic:  No focal neurological deficits.   Lab Results:  Data Reviewed: I have personally reviewed following labs and imaging studies  CBC: Recent Labs  Lab 02/20/20 0111 02/23/20 0954  WBC 16.4* 17.0*  HGB 12.4 12.7  HCT  37.6 38.3  MCV 88.9 87.4  PLT 220 [id number]    Basic Metabolic Panel: Recent Labs  Lab 02/18/20 0301 02/19/20 0423 02/20/20 0111 02/21/20 0225 02/22/20 0047 02/23/20 0954  NA 139 137 137 134* 133* 133*  K 4.0 4.2 4.2 4.1 3.9 3.8  CL 101 99 99 97* 97* 96*  CO2 28 28 29 28 26 27   GLUCOSE 175* 141* 176* 164* 156* 183*  BUN 32* 31* 33* 32* 27* 30*  CREATININE 0.73 0.74 0.83 0.76 0.73 0.78  CALCIUM 7.9* 7.8* 7.8* 7.9* 7.5* 7.7*  MG 2.3  --   --   --   --   --     GFR: Estimated Creatinine Clearance: 68.2 mL/min (by C-G formula based on SCr of 0.78 mg/dL).  Liver Function Tests: Recent Labs  Lab 02/18/20 0301 02/19/20 0423 02/20/20 0111  AST 19 20 22   ALT 22 22 27   ALKPHOS 54 58 62  BILITOT 0.9 0.6 1.0  PROT 5.0* 4.7* 5.1*  ALBUMIN 2.3* 2.2* 2.3*    CBG: Recent Labs  Lab 02/23/20 1556 02/23/20 2048 02/23/20 2359 02/24/20 0321 02/24/20 0716  GLUCAP 180* 130* 125* 167* 134*      No results found for this or any previous visit (from the past 240 hour(s)).    Radiology Studies: No results found.     LOS: 16 days   Little Ishikawa  [facility] Pager: Secure chat/epic messenger  02/24/2020, 7:22 AM

## 2020-02-24 NOTE — Plan of Care (Signed)
  Problem: Education: Goal: Knowledge of risk factors and measures for prevention of condition will improve Outcome: Progressing   Problem: Coping: Goal: Psychosocial and spiritual needs will be supported Outcome: Progressing   Problem: Respiratory: Goal: Will maintain a patent airway Outcome: Progressing Goal: Complications related to the disease process, condition or treatment will be avoided or minimized Outcome: Progressing   Problem: Education: Goal: Knowledge of General Education information will improve Description: Including pain rating scale, medication(s)/side effects and non-pharmacologic comfort measures Outcome: Progressing   Problem: Health Behavior/Discharge Planning: Goal: Ability to manage health-related needs will improve Outcome: Progressing   Problem: Clinical Measurements: Goal: Ability to maintain clinical measurements within normal limits will improve Outcome: Progressing Goal: Will remain free from infection Outcome: Progressing Goal: Diagnostic test results will improve Outcome: Progressing Goal: Respiratory complications will improve Outcome: Progressing Goal: Cardiovascular complication will be avoided Outcome: Progressing   Problem: Activity: Goal: Risk for activity intolerance will decrease Outcome: Progressing   Problem: Nutrition: Goal: Adequate nutrition will be maintained Outcome: Progressing   Problem: Coping: Goal: Level of anxiety will decrease Outcome: Progressing   Problem: Elimination: Goal: Will not experience complications related to bowel motility Outcome: Progressing Goal: Will not experience complications related to urinary retention Outcome: Progressing   Problem: Pain Managment: Goal: General experience of comfort will improve Outcome: Progressing   Problem: Safety: Goal: Ability to remain free from injury will improve Outcome: Progressing   Problem: Skin Integrity: Goal: Risk for impaired skin integrity will  decrease Outcome: Progressing   Problem: Nutrition Goal: Nutritional status is improving Description: Monitor and assess patient for malnutrition (ex- brittle hair, bruises, dry skin, pale skin and conjunctiva, muscle wasting, smooth red tongue, and disorientation). Collaborate with interdisciplinary team and initiate plan and interventions as ordered.  Monitor patient's weight and dietary intake as ordered or per policy. Utilize nutrition screening tool and intervene per policy. Determine patient's food preferences and provide high-protein, high-caloric foods as appropriate.  Outcome: Progressing

## 2020-02-25 LAB — GLUCOSE, CAPILLARY
Glucose-Capillary: 109 mg/dL — ABNORMAL HIGH (ref 70–99)
Glucose-Capillary: 112 mg/dL — ABNORMAL HIGH (ref 70–99)
Glucose-Capillary: 116 mg/dL — ABNORMAL HIGH (ref 70–99)
Glucose-Capillary: 126 mg/dL — ABNORMAL HIGH (ref 70–99)
Glucose-Capillary: 150 mg/dL — ABNORMAL HIGH (ref 70–99)
Glucose-Capillary: 82 mg/dL (ref 70–99)

## 2020-02-25 MED ORDER — PREDNISONE 20 MG PO TABS
20.0000 mg | ORAL_TABLET | Freq: Every day | ORAL | Status: DC
Start: 2020-02-26 — End: 2020-03-01
  Administered 2020-02-26 – 2020-02-29 (×4): 20 mg via ORAL
  Filled 2020-02-25 (×4): qty 1

## 2020-02-25 NOTE — Progress Notes (Signed)
Per Day RN, patient currently has no IV access due to removal of PICC line by IV team. MD is aware that patient has no IV access and has changed meds for patient to take them PO.

## 2020-02-25 NOTE — Plan of Care (Signed)
  Problem: Education: Goal: Knowledge of risk factors and measures for prevention of condition will improve Outcome: Progressing   Problem: Coping: Goal: Psychosocial and spiritual needs will be supported Outcome: Progressing   Problem: Respiratory: Goal: Will maintain a patent airway Outcome: Progressing Goal: Complications related to the disease process, condition or treatment will be avoided or minimized Outcome: Progressing   Problem: Education: Goal: Knowledge of General Education information will improve Description: Including pain rating scale, medication(s)/side effects and non-pharmacologic comfort measures Outcome: Progressing   Problem: Health Behavior/Discharge Planning: Goal: Ability to manage health-related needs will improve Outcome: Progressing   Problem: Clinical Measurements: Goal: Ability to maintain clinical measurements within normal limits will improve Outcome: Progressing Goal: Will remain free from infection Outcome: Progressing Goal: Diagnostic test results will improve Outcome: Progressing Goal: Respiratory complications will improve Outcome: Progressing Goal: Cardiovascular complication will be avoided Outcome: Progressing   Problem: Activity: Goal: Risk for activity intolerance will decrease Outcome: Progressing   Problem: Nutrition: Goal: Adequate nutrition will be maintained Outcome: Progressing   Problem: Coping: Goal: Level of anxiety will decrease Outcome: Progressing   Problem: Elimination: Goal: Will not experience complications related to bowel motility Outcome: Progressing Goal: Will not experience complications related to urinary retention Outcome: Progressing   Problem: Pain Managment: Goal: General experience of comfort will improve Outcome: Progressing   Problem: Safety: Goal: Ability to remain free from injury will improve Outcome: Progressing   Problem: Skin Integrity: Goal: Risk for impaired skin integrity will  decrease Outcome: Progressing   Problem: Nutrition Goal: Nutritional status is improving Description: Monitor and assess patient for malnutrition (ex- brittle hair, bruises, dry skin, pale skin and conjunctiva, muscle wasting, smooth red tongue, and disorientation). Collaborate with interdisciplinary team and initiate plan and interventions as ordered.  Monitor patient's weight and dietary intake as ordered or per policy. Utilize nutrition screening tool and intervene per policy. Determine patient's food preferences and provide high-protein, high-caloric foods as appropriate.  Outcome: Progressing   

## 2020-02-25 NOTE — Progress Notes (Signed)
PROGRESS NOTE  Diane Mendoza ATF:[id number] DOB: 1946/01/11 DOA: 01/14/2020  PCP: Pcp, No  Brief History/Interval Summary: 75 year old female with past medical history of essential hypertension, peripheral vascular disease, hyperlipidemia who tested positive for Covid on 12/23 presented on 12/29 with worsening weakness.  Was started on steroids Remdesivir and baricitinib.  Subsequently found to have new onset atrial fibrillation and started on Cardizem infusion.  There was also concern for GI bleeding [facility] gastroenterology was consulted.  No endoscopies were performed.  Over the course of the hospitalization patient has had increasing oxygen requirements.  Transferred to the ICU subsequently - oxygen downtrending, back on the floor - ultimate disposition likely SNF.  Reason for Visit: Pneumonia due to COVID-19.  Acute respiratory failure with hypoxia  Consultants: Pulmonology.  [facility]  Procedures: None yet  Antibiotics: Anti-infectives (From admission, onward)   Start     Dose/Rate Route Frequency Ordered Stop   02/11/20 1000  remdesivir 100 mg in sodium chloride 0.9 % 100 mL IVPB        100 mg 200 mL/hr over 30 Minutes Intravenous Daily 03/05/2020 1426 02/13/20 0956   03/10/2020 1730  azithromycin (ZITHROMAX) 500 mg in sodium chloride 0.9 % 250 mL IVPB        500 mg 250 mL/hr over 60 Minutes Intravenous Every 24 hours 02/28/2020 1636 02/14/20 1816   02/13/2020 1730  cefTRIAXone (ROCEPHIN) 2 g in sodium chloride 0.9 % 100 mL IVPB        2 g 200 mL/hr over 30 Minutes Intravenous Every 24 hours 02/11/2020 1636 02/14/20 1802   02/09/20 1000  remdesivir 100 mg in sodium chloride 0.9 % 100 mL IVPB  Status:  Discontinued       "Followed by" Linked Group Details   100 mg 200 mL/hr over 30 Minutes Intravenous Daily 01/22/2020 2020 03/08/2020 1426   01/17/2020 2030  remdesivir 200 mg in sodium chloride 0.9% 250 mL IVPB  Status:  Discontinued       "Followed by" Linked Group Details   200  mg 580 mL/hr over 30 Minutes Intravenous Once 01/31/2020 2020 02/22/2020 1426      Subjective/Interval History: Patient lying comfortably on the bed.  No issues reported by nursing staff.  Patient very hard of hearing.  Denies any shortness of breath or chest pain this morning. Generally feels like she is improving slowly - still very weak and moderately dyspneic with minimal exertion.  Assessment/Plan:  Acute Hypoxic Resp. Failure/Pneumonia due to COVID-19 Antibacterials: She completed a 5-day course of ceftriaxone and azithromycin Remdesivir: Patient completed 5-day course of Remdesivir Steroids: Remains on Solu-Medrol Diuretics: Furosemide has been given PRN over the last few days Inhaled Steroids: On Dulera Baricitinib: 1/1-1/15 PUD Prophylaxis: On PPI DVT Prophylaxis: SCDs only due to GI bleed Chest x-ray previously noted to be improving Leukocytosis likely secondary to steroids.   Continue incentive spirometry and mobilization - encouraged proning as tolerated  Atrial fibrillation with RVR, rate controlled Initially required diltiazem drip - transitioned to PO diltiazem TSH noted to be 1.24.   Echocardiogram shows normal systolic function with grade 2 diastolic dysfunction.   No anticoagulation due to concern for GI bleed.  Hyperlipidemia Continue atorvastatin.  GI bleed with melanotic stools, stable Gastroenterology previously consulted.  No endoscopy was done due to her tenuous respiratory status.  Continue PPI.  No overt bleeding noted.  Hemoglobin has been stable.  Essential hypertension Monitor blood pressure.  Patient on diltiazem.  Blood pressure is reasonably well controlled.  Hyperglycemia HbA1c 6.2.  Elevated CBGs due to steroids.  Continue SSI and Lantus.  Goals of care Conversation held with her daughter and son by critical care medicine.  Palliative care was also following.  Patient changed to DNR.  Hypokalemia Has been corrected.  Obesity Estimated body  mass index is 32.78 kg/m as calculated from the following:   Height as of this encounter: 5\' 5"  (1.651 m).   Weight as of this encounter: 89.4 kg.   DVT Prophylaxis: SCDs Code Status: DNR Family Communication: Voicemail left for son Diane Mendoza again, no answer the last few attempts to call. Disposition Plan: SNF  Status is: Inpatient  Remains inpatient appropriate because:IV treatments appropriate due to intensity of illness or inability to take PO and Inpatient level of care appropriate due to severity of illness   Dispo: The patient is from: Home              Anticipated d/c is to: To be determined - likely SNF              Anticipated d/c date is: 3 days              Patient currently is not medically stable to d/c.  Medications:  Scheduled: . vitamin C  500 mg Oral Daily  . atorvastatin  40 mg Oral Daily  . baricitinib  4 mg Oral Daily  . chlorhexidine  15 mL Mouth Rinse BID  . Chlorhexidine Gluconate Cloth  6 each Topical Daily  . cholecalciferol  1,000 Units Oral Daily  . diltiazem  60 mg Oral Q6H  . feeding supplement  237 mL Oral BID BM  . insulin aspart  0-15 Units Subcutaneous Q4H  . insulin glargine  8 Units Subcutaneous Daily  . mouth rinse  15 mL Mouth Rinse q12n4p  . methylPREDNISolone (SOLU-MEDROL) injection  40 mg Intravenous Daily  . mometasone-formoterol  2 puff Inhalation BID  . nystatin   Topical TID  . pantoprazole  40 mg Oral BID  . polyethylene glycol  17 g Oral BID  . zinc sulfate  220 mg Oral Daily   Continuous:  [id number], albuterol, chlorpheniramine-HYDROcodone, guaiFENesin-dextromethorphan, hydrOXYzine, labetalol, ondansetron (ZOFRAN) IV, promethazine, sodium chloride, sodium chloride flush   Objective:  Vital Signs  Vitals:   02/24/20 2353 02/25/20 0030 02/25/20 0112 02/25/20 0416  BP: 123/65   (!) 144/65  Pulse: 93   97  Resp: 14   20  Temp: 98.7 F (37.1 C)   98.5 F (36.9 C)  TempSrc: Oral   Oral  SpO2: (!) 88% 90% (!) 88%  92%  Weight:      Height:        Intake/Output Summary (Last 24 hours) at 02/25/2020 0759 Last data filed at 02/25/2020 0418 Gross per 24 hour  Intake 120 ml  Output 900 ml  Net -780 ml   Filed Weights   02/05/2020 2139  Weight: 89.4 kg    General appearance: Awake alert.  In no distress. Hard of hearing Resp: Normal effort at rest.  Diminished breath sounds without over wheezing, rales, or rhonchi. Cardio: S1-S2 is irregularly irregular.  No S3-S4.  No rubs murmurs or bruit GI: Abdomen is soft.  Nontender nondistended.  Bowel sounds are present normal.  No masses organomegaly Extremities: No edema.  Neurologic:  No focal neurological deficits.   Lab Results:  Data Reviewed: I have personally reviewed following labs and imaging studies  CBC: Recent Labs  Lab 02/20/20 0111 02/23/20 0954  WBC  16.4* 17.0*  HGB 12.4 12.7  HCT 37.6 38.3  MCV 88.9 87.4  PLT 220 403    Basic Metabolic Panel: Recent Labs  Lab 02/19/20 0423 02/20/20 0111 02/21/20 0225 02/22/20 0047 02/23/20 0954  NA 137 137 134* 133* 133*  K 4.2 4.2 4.1 3.9 3.8  CL 99 99 97* 97* 96*  CO2 28 29 28 26 27   GLUCOSE 141* 176* 164* 156* 183*  BUN 31* 33* 32* 27* 30*  CREATININE 0.74 0.83 0.76 0.73 0.78  CALCIUM 7.8* 7.8* 7.9* 7.5* 7.7*    GFR: Estimated Creatinine Clearance: 68.2 mL/min (by C-G formula based on SCr of 0.78 mg/dL).  Liver Function Tests: Recent Labs  Lab 02/19/20 0423 02/20/20 0111  AST 20 22  ALT 22 27  ALKPHOS 58 62  BILITOT 0.6 1.0  PROT 4.7* 5.1*  ALBUMIN 2.2* 2.3*    CBG: Recent Labs  Lab 02/24/20 1200 02/24/20 1720 02/24/20 2002 02/24/20 2332 02/25/20 0412  GLUCAP 153* 223* 214* 178* 112*      No results found for this or any previous visit (from the past 240 hour(s)).    Radiology Studies: No results found.     LOS: 17 days   Little Ishikawa  [facility] Pager: Secure chat/epic messenger  02/25/2020, 7:59 AM

## 2020-02-25 NOTE — Progress Notes (Signed)
Site with dressing peeling off. Skin around site blistered and peeling. PICC out 4cm. Dressing changed and gauze placed over blistering site. Will notify MD.

## 2020-02-25 NOTE — Progress Notes (Signed)
Updated Pt's son via phone and answered all questions to the best of my ability.     02/25/20 1400  Family/Significant Other Communication  Family/Significant Other Update Called;Updated (Pts son)

## 2020-02-26 LAB — GLUCOSE, CAPILLARY
Glucose-Capillary: 95 mg/dL (ref 70–99)
Glucose-Capillary: 96 mg/dL (ref 70–99)
Glucose-Capillary: 140 mg/dL — ABNORMAL HIGH (ref 70–99)
Glucose-Capillary: 133 mg/dL — ABNORMAL HIGH (ref 70–99)
Glucose-Capillary: 82 mg/dL (ref 70–99)

## 2020-02-26 MED ORDER — FUROSEMIDE 20 MG PO TABS
20.0000 mg | ORAL_TABLET | Freq: Two times a day (BID) | ORAL | Status: DC
  Administered 2020-02-26 – 2020-02-29 (×7): 20 mg via ORAL
  Filled 2020-02-26 (×7): qty 1

## 2020-02-26 MED ORDER — INSULIN ASPART 100 UNIT/ML ~~LOC~~ SOLN
0.0000 [IU] | Freq: Three times a day (TID) | SUBCUTANEOUS | Status: DC
  Administered 2020-02-27 (×2): 3 [IU] via SUBCUTANEOUS
  Administered 2020-02-27 – 2020-02-28 (×2): 2 [IU] via SUBCUTANEOUS
  Administered 2020-02-28: 3 [IU] via SUBCUTANEOUS
  Administered 2020-02-29 (×3): 2 [IU] via SUBCUTANEOUS

## 2020-02-26 NOTE — Progress Notes (Addendum)
PROGRESS NOTE  Diane Mendoza EYC:[id number] DOB: 02/06/1946 DOA: 01/16/2020  PCP: Pcp, No  Brief History/Interval Summary: 75 year old female with past medical history of essential hypertension, peripheral vascular disease, hyperlipidemia who tested positive for Covid on 12/23 presented on 12/29 with worsening weakness.  Was started on steroids Remdesivir and baricitinib.  Subsequently found to have new onset atrial fibrillation and started on Cardizem infusion.  There was also concern for GI bleeding [facility] gastroenterology was consulted.  No endoscopies were performed.  Over the course of the hospitalization patient has had increasing oxygen requirements.  Transferred to the ICU subsequently - oxygen downtrending, back on the floor - ultimate disposition likely SNF.  Reason for Visit: Pneumonia due to COVID-19.  Acute respiratory failure with hypoxia  Consultants: Pulmonology. [facility]  Procedures: None yet  Antibiotics: Anti-infectives (From admission, onward)   Start     Dose/Rate Route Frequency Ordered Stop   02/11/20 1000  remdesivir 100 mg in sodium chloride 0.9 % 100 mL IVPB        100 mg 200 mL/hr over 30 Minutes Intravenous Daily 02/06/2020 1426 02/13/20 0956   01/27/2020 1730  azithromycin (ZITHROMAX) 500 mg in sodium chloride 0.9 % 250 mL IVPB        500 mg 250 mL/hr over 60 Minutes Intravenous Every 24 hours 02/04/2020 1636 02/14/20 1816   02/09/2020 1730  cefTRIAXone (ROCEPHIN) 2 g in sodium chloride 0.9 % 100 mL IVPB        2 g 200 mL/hr over 30 Minutes Intravenous Every 24 hours 01/12/2020 1636 02/14/20 1802   02/09/20 1000  remdesivir 100 mg in sodium chloride 0.9 % 100 mL IVPB  Status:  Discontinued       "Followed by" Linked Group Details   100 mg 200 mL/hr over 30 Minutes Intravenous Daily 01/12/2020 2020 01/30/2020 1426   02/03/2020 2030  remdesivir 200 mg in sodium chloride 0.9% 250 mL IVPB  Status:  Discontinued       "Followed by" Linked Group Details   200 mg 580  mL/hr over 30 Minutes Intravenous Once 01/22/2020 2020 02/10/2020 1426      Subjective/Interval History: PICC line dislodged previously, removed yesterday, no other acute issues or events overnight.  Patient still complains of marked dyspnea even with minimal exertion and repositioning in bed.  Denies overt fevers chills nausea vomiting diarrhea or constipation.  Lengthy discussion at bedside about need for increased activity and participation with PT.  Assessment/Plan:  Acute Hypoxic Resp. Failure/Pneumonia due to COVID-19 Antibacterials: She completed a 5-day course of ceftriaxone and azithromycin Remdesivir: Patient completed 5-day course of Remdesivir Steroids: Remains on prednisone - continue taper Diuretics: Furosemide 20 BID started 02/26/2020 Inhaled Steroids: On Dulera Baricitinib: 1/1-1/15 PUD Prophylaxis: On PPI DVT Prophylaxis: SCDs only due to GI bleed Chest x-ray previously noted to be improving Leukocytosis likely secondary to steroids Continue incentive spirometry and mobilization - encouraged proning as tolerated  Atrial fibrillation with RVR, rate controlled Initially required diltiazem drip - transitioned to PO diltiazem TSH noted to be 1.24 Echocardiogram shows normal systolic function with grade 2 diastolic dysfunction No anticoagulation due to concern for GI bleed  Hyperlipidemia Continue atorvastatin  GI bleed with melanotic stools, stable Gastroenterology previously consulted.  No endoscopy was done due to her tenuous respiratory status.  Continue PPI.  No overt bleeding noted.  Hemoglobin has been stable  Essential hypertension Monitor blood pressure Patient on diltiazem Blood pressure is reasonably well controlled  Hyperglycemia HbA1c 6.2.  Elevated CBGs due to steroids.   Continue SSI and Lantus  Goals of care Conversation held with her daughter and son by critical care medicine Palliative care was also following Patient changed to  DNR  Hypokalemia Has been corrected  Obesity Estimated body mass index is 32.78 kg/m as calculated from the following:   Height as of this encounter: 5\' 5"  (1.651 m).   Weight as of this encounter: 89.4 kg.  DVT Prophylaxis: SCDs Code Status: DNR Family Communication: Spoke with son/daughter at length about patient's prognosis, plan for disposition to rehab and ongoing care Disposition Plan: SNF  Status is: Inpatient  Remains inpatient appropriate because:IV treatments appropriate due to intensity of illness or inability to take PO and Inpatient level of care appropriate due to severity of illness   Dispo: The patient is from: Home             Anticipated d/c is to: To be determined - likely SNF             Anticipated d/c date is: 3 days             Patient currently: is not medically stable to d/c.  Medications:  Scheduled: . vitamin C  500 mg Oral Daily  . atorvastatin  40 mg Oral Daily  . chlorhexidine  15 mL Mouth Rinse BID  . Chlorhexidine Gluconate Cloth  6 each Topical Daily  . cholecalciferol  1,000 Units Oral Daily  . diltiazem  60 mg Oral Q6H  . feeding supplement  237 mL Oral BID BM  . insulin aspart  0-15 Units Subcutaneous Q4H  . insulin glargine  8 Units Subcutaneous Daily  . mouth rinse  15 mL Mouth Rinse q12n4p  . mometasone-formoterol  2 puff Inhalation BID  . nystatin   Topical TID  . pantoprazole  40 mg Oral BID  . polyethylene glycol  17 g Oral BID  . predniSONE  20 mg Oral Q breakfast  . zinc sulfate  220 mg Oral Daily   [id number], albuterol, chlorpheniramine-HYDROcodone, guaiFENesin-dextromethorphan, hydrOXYzine, labetalol, ondansetron (ZOFRAN) IV, promethazine, sodium chloride, sodium chloride flush  Objective:  Vitals:   02/25/20 1924 02/25/20 2327 02/26/20 0049 02/26/20 0329  BP: (!) 128/55 (!) 142/57  (!) 133/55  Pulse: 75 92  91  Resp: 17 20  20   Temp: 98 F (36.7 C) 97.9 F (36.6 C)  98.6 F (37 C)  TempSrc: Oral Oral   Axillary  SpO2: 91% 96% 93% 93%  Weight:      Height:        Intake/Output Summary (Last 24 hours) at 02/26/2020 0646 Last data filed at 02/25/2020 0900 Gross per 24 hour  Intake 120 ml  Output -  Net 120 ml   Filed Weights   02/06/2020 2139  Weight: 89.4 kg   General appearance: Awake alert.  In no distress. Hard of hearing Resp: Normal effort at rest.  Diminished breath sounds without over wheezing, rales, or rhonchi. Cardio: S1-S2 is irregularly irregular.  No S3-S4.  No rubs murmurs or bruit GI: Abdomen is soft.  Nontender nondistended.  Bowel sounds are present normal.  No masses organomegaly Extremities: Right PICC line site somewhat edematous, right upper extremity 2+ pitting edema diffusely.  Neurologic:  No focal neurological deficits.   Lab Results:  Data Reviewed: I have personally reviewed following labs and imaging studies  CBC: Recent Labs  Lab 02/20/20 0111 02/23/20 0954  WBC 16.4* 17.0*  HGB 12.4 12.7  HCT 37.6 38.3  MCV 88.9 87.4  PLT 220 194   Basic Metabolic Panel: Recent Labs  Lab 02/20/20 0111 02/21/20 0225 02/22/20 0047 02/23/20 0954  NA 137 134* 133* 133*  K 4.2 4.1 3.9 3.8  CL 99 97* 97* 96*  CO2 29 28 26 27   GLUCOSE 176* 164* 156* 183*  BUN 33* 32* 27* 30*  CREATININE 0.83 0.76 0.73 0.78  CALCIUM 7.8* 7.9* 7.5* 7.7*   GFR: Estimated Creatinine Clearance: 68.2 mL/min (by C-G formula based on SCr of 0.78 mg/dL).  Liver Function Tests: Recent Labs  Lab 02/20/20 0111  AST 22  ALT 27  ALKPHOS 62  BILITOT 1.0  PROT 5.1*  ALBUMIN 2.3*   CBG: Recent Labs  Lab 02/25/20 1226 02/25/20 1702 02/25/20 1930 02/25/20 2335 02/26/20 0331  GLUCAP 126* 150* 116* 82 95   No results found for this or any previous visit (from the past 240 hour(s)).   Radiology Studies: No results found.   LOS: 18 days   [facility] Pager: Secure chat/epic messenger  02/26/2020, 6:46 AM

## 2020-02-27 LAB — BASIC METABOLIC PANEL
Anion gap: 9 (ref 5–15)
BUN: 23 mg/dL (ref 8–23)
CO2: 29 mmol/L (ref 22–32)
Calcium: 7.6 mg/dL — ABNORMAL LOW (ref 8.9–10.3)
Chloride: 97 mmol/L — ABNORMAL LOW (ref 98–111)
Creatinine, Ser: 0.65 mg/dL (ref 0.44–1.00)
GFR, Estimated: >60 mL/min (ref >60)
Glucose, Bld: 85 mg/dL (ref 70–99)
Potassium: 3.6 mmol/L (ref 3.5–5.1)
Sodium: 135 mmol/L (ref 135–145)

## 2020-02-27 LAB — CBC
HCT: 33 % — ABNORMAL LOW (ref 36.0–46.0)
Hemoglobin: 10.9 g/dL — ABNORMAL LOW (ref 12.0–15.0)
MCH: 29.2 pg (ref 26.0–34.0)
MCHC: 33 g/dL (ref 30.0–36.0)
MCV: 88.5 fL (ref 80.0–100.0)
Platelets: 131 10*3/uL — ABNORMAL LOW (ref 150–400)
RBC: 3.73 MIL/uL — ABNORMAL LOW (ref 3.87–5.11)
RDW: 12.7 % (ref 11.5–15.5)
WBC: 14.8 10*3/uL — ABNORMAL HIGH (ref 4.0–10.5)
nRBC: 0 % (ref 0.0–0.2)

## 2020-02-27 LAB — GLUCOSE, CAPILLARY
Glucose-Capillary: 83 mg/dL (ref 70–99)
Glucose-Capillary: 140 mg/dL — ABNORMAL HIGH (ref 70–99)
Glucose-Capillary: 186 mg/dL — ABNORMAL HIGH (ref 70–99)
Glucose-Capillary: 167 mg/dL — ABNORMAL HIGH (ref 70–99)

## 2020-02-27 NOTE — Progress Notes (Addendum)
PROGRESS NOTE  Diane Mendoza NGE:[id number] DOB: [birth year] DOA: 01/28/2020  PCP: Pcp, No  Brief History/Interval Summary: 75 year old female with past medical history of essential hypertension, peripheral vascular disease, hyperlipidemia who tested positive for Covid on 12/23 presented on 12/29 with worsening weakness.  Was started on steroids Remdesivir and baricitinib.  Subsequently found to have new onset atrial fibrillation and started on Cardizem infusion.  There was also concern for GI bleeding [facility] gastroenterology was consulted.  No endoscopies were performed.  Over the course of the hospitalization patient has had increasing oxygen requirements.  Transferred to the ICU subsequently - oxygen downtrending, back on the floor - ultimate disposition likely SNF.  Reason for Visit: Pneumonia due to COVID-19.  Acute respiratory failure with hypoxia  Consultants: Pulmonology. [facility]  Procedures: None yet  Antibiotics: Anti-infectives (From admission, onward)   Start     Dose/Rate Route Frequency Ordered Stop   02/11/20 1000  remdesivir 100 mg in sodium chloride 0.9 % 100 mL IVPB        100 mg 200 mL/hr over 30 Minutes Intravenous Daily 01/13/2020 1426 02/13/20 0956   01/31/2020 1730  azithromycin (ZITHROMAX) 500 mg in sodium chloride 0.9 % 250 mL IVPB        500 mg 250 mL/hr over 60 Minutes Intravenous Every 24 hours 01/23/2020 1636 02/14/20 1816   01/30/2020 1730  cefTRIAXone (ROCEPHIN) 2 g in sodium chloride 0.9 % 100 mL IVPB        2 g 200 mL/hr over 30 Minutes Intravenous Every 24 hours 01/31/2020 1636 02/14/20 1802   02/09/20 1000  remdesivir 100 mg in sodium chloride 0.9 % 100 mL IVPB  Status:  Discontinued       "Followed by" Linked Group Details   100 mg 200 mL/hr over 30 Minutes Intravenous Daily 02/06/2020 2020 01/19/2020 1426   01/12/2020 2030  remdesivir 200 mg in sodium chloride 0.9% 250 mL IVPB  Status:  Discontinued       "Followed by" Linked Group Details   200 mg 580  mL/hr over 30 Minutes Intravenous Once 01/26/2020 2020 01/12/2020 1426      Subjective/Interval History: Somewhat worsening respiratory sats over the past 48h - clinically patient appears to be at previous baseline (somewhat easily fatigued but non acute).  Lengthy discussion at bedside about need for increased activity and participation with PT.  Assessment/Plan:  EVENING UPDATE: Given worsening hypoxia and respiratory status despite max heated high flow and NRB - family invited to come to the hospital tonight given tenuous status.  Acute Hypoxic Resp. Failure/Pneumonia due to COVID-19 Antibacterials: She completed a 5-day course of ceftriaxone and azithromycin Remdesivir: Patient completed 5-day course of Remdesivir Steroids: Remains on prednisone - continue taper if respiratory status continues to worsen consider restarting higher dose steroids given respiratory status decline soon after weaning steroids down. Diuretics: Furosemide 20 BID started 02/26/2020 diuresing very well over past 24h (nearly 5L UOP) Inhaled Steroids: On Dulera Baricitinib: 1/1-1/15 PUD Prophylaxis: On PPI DVT Prophylaxis: SCDs only due to GI bleed Chest x-ray previously noted to be improving Leukocytosis likely secondary to steroids Continue incentive spirometry and mobilization - encouraged proning as tolerated; Patient poorly tolerating any activity/flutter/incentive - unclear if depression related vs deconditioning and malnutrition  Atrial fibrillation with RVR, rate controlled Initially required diltiazem drip - transitioned to PO diltiazem TSH noted to be 1.24 Echocardiogram shows normal systolic function with grade 2 diastolic dysfunction No anticoagulation due to concern for GI bleed  Hyperlipidemia Continue  atorvastatin  GI bleed with melanotic stools, stable Gastroenterology previously consulted.  No endoscopy was done due to her tenuous respiratory status.  Continue PPI.  No overt bleeding noted.   Hemoglobin has been stable  Essential hypertension Monitor blood pressure Patient on diltiazem Blood pressure is reasonably well controlled  Hyperglycemia HbA1c 6.2.   Elevated CBGs due to steroids.   Continue SSI and Lantus  Goals of care Conversation held with her daughter and son by critical care medicine Palliative care was also following Patient changed to DNR  Hypokalemia Has been corrected  Obesity Estimated body mass index is 33.05 kg/m as calculated from the following:   Height as of this encounter: 5\' 5"  (1.651 m).   Weight as of this encounter: 90.1 kg.  DVT Prophylaxis: SCDs Code Status: DNR Family Communication: Son updated at length about patient's worsening prognosis and respiratory status Disposition Plan: SNF  Status is: Inpatient  Remains inpatient appropriate because:IV treatments appropriate due to intensity of illness or inability to take PO and Inpatient level of care appropriate due to severity of illness   Dispo: The patient is from: Home             Anticipated d/c is to: To be determined - likely SNF             Anticipated d/c date is: > 3 days             Patient currently: is not medically stable to d/c.  Medications:  Scheduled: . vitamin C  500 mg Oral Daily  . atorvastatin  40 mg Oral Daily  . chlorhexidine  15 mL Mouth Rinse BID  . Chlorhexidine Gluconate Cloth  6 each Topical Daily  . cholecalciferol  1,000 Units Oral Daily  . diltiazem  60 mg Oral Q6H  . feeding supplement  237 mL Oral BID BM  . furosemide  20 mg Oral BID  . insulin aspart  0-15 Units Subcutaneous TID AC & HS  . mouth rinse  15 mL Mouth Rinse q12n4p  . mometasone-formoterol  2 puff Inhalation BID  . nystatin   Topical TID  . pantoprazole  40 mg Oral BID  . polyethylene glycol  17 g Oral BID  . predniSONE  20 mg Oral Q breakfast  . zinc sulfate  220 mg Oral Daily   [id number], albuterol, chlorpheniramine-HYDROcodone, guaiFENesin-dextromethorphan,  hydrOXYzine, labetalol, ondansetron (ZOFRAN) IV, promethazine, sodium chloride, sodium chloride flush  Objective:  Vitals:   02/26/20 2343 02/27/20 0042 02/27/20 0343 02/27/20 0408  BP: 118/67 116/64  115/61  Pulse: 89 87 82 94  Resp: 19 (!) 25 (!) 26 19  Temp: 98.4 F (36.9 C)   99.2 F (37.3 C)  TempSrc: Axillary   Axillary  SpO2: 90% 90% (!) 89% 92%  Weight:    90.1 kg  Height:        Intake/Output Summary (Last 24 hours) at 02/27/2020 0625 Last data filed at 02/26/2020 2300 Gross per 24 hour  Intake 300 ml  Output 600 ml  Net -300 ml   Filed Weights   01/18/2020 2139 02/27/20 0408  Weight: 89.4 kg 90.1 kg   General appearance: Awake alert.  In no distress. Hard of hearing Resp: Normal effort at rest.  Diminished breath sounds without over wheezing, rales, or rhonchi. Cardio: S1-S2 is irregularly irregular.  No S3-S4.  No rubs murmurs or bruit GI: Abdomen is soft.  Nontender nondistended.  Bowel sounds are present normal.  No masses organomegaly Extremities: Right  arm 1+ pitting edema/improving.  Neurologic:  No focal neurological deficits.   Lab Results:  Data Reviewed: I have personally reviewed following labs and imaging studies  CBC: Recent Labs  Lab 02/23/20 0954 02/27/20 0130  WBC 17.0* 14.8*  HGB 12.7 10.9*  HCT 38.3 33.0*  MCV 87.4 88.5  PLT 235 290*   Basic Metabolic Panel: Recent Labs  Lab 02/21/20 0225 02/22/20 0047 02/23/20 0954 02/27/20 0130  NA 134* 133* 133* 135  K 4.1 3.9 3.8 3.6  CL 97* 97* 96* 97*  CO2 28 26 27 29   GLUCOSE 164* 156* 183* 85  BUN 32* 27* 30* 23  CREATININE 0.76 0.73 0.78 0.65  CALCIUM 7.9* 7.5* 7.7* 7.6*   GFR: Estimated Creatinine Clearance: 68.4 mL/min (by C-G formula based on SCr of 0.65 mg/dL).  Liver Function Tests: No results for input(s): AST, ALT, ALKPHOS, BILITOT, PROT, ALBUMIN in the last 168 hours. CBG: Recent Labs  Lab 02/26/20 0331 02/26/20 0749 02/26/20 1226 02/26/20 1706 02/26/20 2156   GLUCAP 95 96 140* 133* 82   No results found for this or any previous visit (from the past 240 hour(s)).   Radiology Studies: No results found.   LOS: 19 days   [facility] Pager: Secure chat/epic messenger  02/27/2020, 6:25 AM

## 2020-02-27 NOTE — Progress Notes (Signed)
Occupational Therapy Treatment Patient Details Name: Diane Mendoza MRN: [id number] DOB: 11/16/1945 Today's Date: 02/27/2020    History of present illness Pt is a 75 y.o. female who tested (+) COVID-19 on 02/02/20, now admitted 01/23/2020 with worsening weakness, new onset afib with RVR, GIB. Workup for sepsis, acute hypoxic respiratory failure due to COVID-19 PNA. Pt with worsening hypoxia requiring transfer to ICU. PMH includes HTN, PVD, cervical CA, arthritis.   OT comments  Spoke with RN prior to entering room and reports pt with tenuous respiratory status, but was hopeful to attempt for OOB to chair if status improved. Pt lying in sidelying upon entry with RT also present given status, spO2 83% on HHFNC (70L, 100% FiO2) + NRB. Assisted with repositioning at bed level and attempting HOB elevated to see if respiratory status would improve when upright. Pt able to perform bed mobility with minA, given increased time she tolerated HOB to 55* by end of session. SpO2 initially only maintaining at 77-79% with repositioning (trialling mutiple probes on ear lobe and R/L hand). Given notable time O2 sats increased to 83-85% while upright and pt in no apparent distress. HR fluctuating 110s-140s with bed level activity. Educated pt on importance of attempting to remain with HOB upright for some time as means to progress OOB/tolerate being up in a recliner chair and pt in agreement. RN made aware of pt position and respiratory status end of session. Will continue per POC at this time.    Follow Up Recommendations  SNF;LTACH    Equipment Recommendations  3 in 1 bedside commode;Other (comment)          Precautions / Restrictions Precautions Precautions: Fall;Other (comment) Precaution Comments: watch SpO2 and HR Restrictions Weight Bearing Restrictions: No       Mobility Bed Mobility Overal bed mobility: Needs Assistance Bed Mobility: Rolling Rolling: Min assist         General bed mobility  comments: light assist to roll/reposition in bed but pt able to do a good amount on her own. +2 to boost to [facility] to attempt partial chair position, had been laying in sidelying upon arrival  Transfers                 General transfer comment: deferred        ADL either performed or assessed with clinical judgement   ADL Overall ADL's : Needs assistance/impaired                                       General ADL Comments: much of session focus on increasing tolerance to upright while also attempting to increase O2 sats as pt had been tenuous with SpO2 low this afternoon                       Cognition Arousal/Alertness: Awake/alert Behavior During Therapy: Flat affect Overall Cognitive Status: No family/caregiver present to determine baseline cognitive functioning Area of Impairment: Safety/judgement;Problem solving                         Safety/Judgement: Decreased awareness of deficits   Problem Solving: Requires verbal cues General Comments: Still HOH, but seemed to be hearing conversation better today. Answering questions and following commands appropriately. Still some slowed processing and decreased insight into deficits        Exercises Exercises: Other exercises Other Exercises Other  Exercises: focus on deep breathing and tolerance to upright during session   Shoulder Instructions       General Comments RT in most of session given pt's tenuous status and maxed out on NRB/HHFNC. RT assisted OT with repositioning pt and attempting O2 probes in multiple locations to attempt to gain improved O2 reading    Pertinent Vitals/ Pain       Pain Assessment: No/denies pain  Home Living                                          Prior Functioning/Environment              Frequency  Min 3X/week        Progress Toward Goals  OT Goals(current goals can now be found in the care plan section)  Progress  towards OT goals: Progressing toward goals  Acute Rehab OT Goals Patient Stated Goal: to be able to do more OT Goal Formulation: With patient Time For Goal Achievement: 03/08/20 Potential to Achieve Goals: Fair ADL Goals Pt Will Perform Grooming: with set-up;with supervision;sitting Pt Will Transfer to Toilet: with min guard assist;stand pivot transfer;bedside commode Pt/caregiver will Perform Home Exercise Program: Increased strength;Both right and left upper extremity;With theraband;With written HEP provided Additional ADL Goal #1: Pt will tolerate 5 mins therapeutic activity with no more than 2 rest breaks and Sp02 >85% Additional ADL Goal #2: Pt will independently perform bil. UE exercise Additional ADL Goal #3: Pt will tolerate OOB, supported sitting for 45 mins in prep for ADLs  Plan Discharge plan remains appropriate    Co-evaluation                 AM-PAC OT "6 Clicks" Daily Activity     Outcome Measure   Help from another person eating meals?: A Little Help from another person taking care of personal grooming?: A Little Help from another person toileting, which includes using toliet, bedpan, or urinal?: Total Help from another person bathing (including washing, rinsing, drying)?: A Lot Help from another person to put on and taking off regular upper body clothing?: Total Help from another person to put on and taking off regular lower body clothing?: Total 6 Click Score: 11    End of Session Equipment Utilized During Treatment: Oxygen  OT Visit Diagnosis: Unsteadiness on feet (R26.81);Other abnormalities of gait and mobility (R26.89);Muscle weakness (generalized) (M62.81)   Activity Tolerance Patient tolerated treatment well;Treatment limited secondary to medical complications (Comment)   Patient Left in bed;with call bell/phone within reach;with bed alarm set   Nurse Communication Mobility status;Other (comment) (O2 status when leaving room)        Time:  9480-1655 OT Time Calculation (min): 30 min  Charges: OT General Charges $OT Visit: 1 Visit OT Treatments $Self Care/Home Management : 23-37 mins  Diane Mendoza, OT Acute Rehabilitation Services Pager [phone] Office [phone]    Raymondo Band 02/27/2020, 4:58 PM

## 2020-02-27 NOTE — Progress Notes (Signed)
Patient continues to desaturate into 70's with minimal exertion on 40L. O2 increased to 65L +15L NRB with sats maintaining around 85%. RT and MD made aware. After changing patient's linen, O2 needed to be increased to 70L +NRB. Patient alert and states she is comfortable. MD stated he will contact the family and update them on patient's status.

## 2020-02-28 LAB — GLUCOSE, CAPILLARY
Glucose-Capillary: 100 mg/dL — ABNORMAL HIGH (ref 70–99)
Glucose-Capillary: 111 mg/dL — ABNORMAL HIGH (ref 70–99)
Glucose-Capillary: 180 mg/dL — ABNORMAL HIGH (ref 70–99)
Glucose-Capillary: 140 mg/dL — ABNORMAL HIGH (ref 70–99)

## 2020-02-28 NOTE — Progress Notes (Signed)
PT Cancellation Note  Patient Details Name: Diane Mendoza MRN: [id number] DOB: [birth year]   Cancelled Treatment:    Reason Eval/Treat Not Completed: Medical issues which prohibited therapy. Spoke with RN, pt with worsening cardiorespiratory status, barely able to tolerate repositioning in bed with nursing staff. Likely plan for comfort care soon. Will plan to keep on caseload and check back as appropriate until this confirmed.  Mabeline Caras, PT, DPT Acute Rehabilitation Services  Pager [phone] Office [place] 02/28/2020, 9:26 AM

## 2020-02-28 NOTE — Progress Notes (Signed)
Spoke with [name] via phone to outline visitation procedures and policies at managements request.

## 2020-02-28 NOTE — Progress Notes (Signed)
PROGRESS NOTE  Diane Mendoza GMW:[id number] DOB: 12/15/1945 DOA: 01/27/2020  PCP: Pcp, No  Brief History/Interval Summary: 75 year old female with past medical history of essential hypertension, peripheral vascular disease, hyperlipidemia who tested positive for Covid on 12/23 presented on 12/29 with worsening weakness. Was started on steroids Remdesivir and baricitinib. Subsequently found to have new onset atrial fibrillation and started on Cardizem infusion. There was also concern for GI bleeding Far [facility] gastroenterology was consulted.  No endoscopies were performed. Over the course of the hospitalization patient has had increasing oxygen requirements. Transferred to the ICU subsequently - oxygen downtrending, back on the floor - ultimate disposition likely SNF.  Reason for Visit: Pneumonia due to COVID-19 w/ acute respiratory failure with hypoxia  Consultants: Pulmonology [facility]  Procedures: None yet  Antibiotics: Anti-infectives (From admission, onward)   Start     Dose/Rate Route Frequency Ordered Stop   02/11/20 1000  remdesivir 100 mg in sodium chloride 0.9 % 100 mL IVPB        100 mg 200 mL/hr over 30 Minutes Intravenous Daily 01/31/2020 1426 02/13/20 0956   01/21/2020 1730  azithromycin (ZITHROMAX) 500 mg in sodium chloride 0.9 % 250 mL IVPB        500 mg 250 mL/hr over 60 Minutes Intravenous Every 24 hours 01/31/2020 1636 02/14/20 1816   01/14/2020 1730  cefTRIAXone (ROCEPHIN) 2 g in sodium chloride 0.9 % 100 mL IVPB        2 g 200 mL/hr over 30 Minutes Intravenous Every 24 hours 02/07/2020 1636 02/14/20 1802   02/09/20 1000  remdesivir 100 mg in sodium chloride 0.9 % 100 mL IVPB  Status:  Discontinued       "Followed by" Linked Group Details   100 mg 200 mL/hr over 30 Minutes Intravenous Daily 01/25/2020 2020 01/19/2020 1426   02/07/2020 2030  remdesivir 200 mg in sodium chloride 0.9% 250 mL IVPB  Status:  Discontinued       "Followed by" Linked Group Details   200 mg 580  mL/hr over 30 Minutes Intravenous Once 01/21/2020 2020 01/27/2020 1426      Subjective/Interval History: Somewhat worsening respiratory sats over the past 48h - clinically patient appears to be at previous baseline (somewhat easily fatigued but non acute).  Lengthy discussion at bedside about need for increased activity and participation with PT.  Assessment/Plan:  Goals of Care - Given worsening hypoxia and respiratory status despite max heated high flow and NRB - family invited to come to the hospital given tenuous status. - Discussed lack of ability to provide more oxygen -we did discuss BiPAP as an option, but noted that prolonged time on the BiPAP is more uncomfortable than a ventilator and given patient does not want to be intubated would not offer BiPAP as a temporizing option. - Lambert Keto and [name] agreed that if patient continues to worsen or become unstable we would focus on comfort measures.  Acute Hypoxic Resp. Failure/Pneumonia due to COVID-19 Antibacterials: She completed a 5-day course of ceftriaxone and azithromycin Remdesivir: Patient completed 5-day course of Remdesivir Steroids: Remains on prednisone  Diuretics: Furosemide ongoing - diuresing very well over past 24h (nearly 5L UOP) Inhaled Steroids: On Dulera Baricitinib: 1/1-1/15 PUD Prophylaxis: On PPI DVT Prophylaxis: SCDs only due to GI bleed Chest x-ray previously noted to be improving Leukocytosis likely secondary to steroids Continue incentive spirometry and mobilization - encouraged proning as tolerated;  Patient poorly tolerating any activity/flutter/incentive - unable to participate with PT today due to weakness  Atrial fibrillation with RVR, now rate controlled Initially required diltiazem drip - transitioned to PO diltiazem TSH noted to be 1.24 Echocardiogram shows normal systolic function with grade 2 diastolic dysfunction No anticoagulation due to concern for GI bleed  Hyperlipidemia Continue  atorvastatin  GI bleed with melanotic stools, stable Gastroenterology previously consulted.  No endoscopy was done due to her tenuous respiratory status.  Continue PPI.  No overt bleeding noted.  Hemoglobin has been stable  Essential hypertension Monitor blood pressure Patient on diltiazem Blood pressure is reasonably well controlled  Hyperglycemia HbA1c 6.2.   Elevated CBGs due to steroids.   Continue SSI and Lantus  Goals of care Conversation held with her daughter and son by critical care medicine Palliative care was also following Patient changed to DNR  Hypokalemia Has been corrected  Obesity Estimated body mass index is 33.05 kg/m as calculated from the following:   Height as of this encounter: 5\' 5"  (1.651 m).   Weight as of this encounter: 90.1 kg.  DVT Prophylaxis: SCDs Code Status: DNR Family Communication: Son/daughter updated at length in hospital about patient's worsening prognosis and respiratory status Disposition Plan: In hospital demise is likely expected if she continues on her current path  Status is: Inpatient  Remains inpatient appropriate because:IV treatments appropriate due to intensity of illness or inability to take PO and Inpatient level of care appropriate due to severity of illness   Dispo: The patient is from: Home             Anticipated d/c is to: To be determined - likely SNF             Anticipated d/c date is: > 3 days             Patient currently: is not medically stable to d/c.  Medications:  Scheduled: . vitamin C  500 mg Oral Daily  . atorvastatin  40 mg Oral Daily  . chlorhexidine  15 mL Mouth Rinse BID  . Chlorhexidine Gluconate Cloth  6 each Topical Daily  . cholecalciferol  1,000 Units Oral Daily  . diltiazem  60 mg Oral Q6H  . feeding supplement  237 mL Oral BID BM  . furosemide  20 mg Oral BID  . insulin aspart  0-15 Units Subcutaneous TID AC & HS  . mouth rinse  15 mL Mouth Rinse q12n4p  . mometasone-formoterol  2  puff Inhalation BID  . nystatin   Topical TID  . pantoprazole  40 mg Oral BID  . polyethylene glycol  17 g Oral BID  . predniSONE  20 mg Oral Q breakfast  . zinc sulfate  220 mg Oral Daily   [id number], albuterol, chlorpheniramine-HYDROcodone, guaiFENesin-dextromethorphan, hydrOXYzine, labetalol, ondansetron (ZOFRAN) IV, promethazine, sodium chloride, sodium chloride flush  Objective:  Vitals:   02/27/20 2321 02/28/20 0346 02/28/20 0358 02/28/20 0717  BP: (!) 102/46  121/67   Pulse: 94 99 94   Resp: 20 (!) 25 20   Temp: 98.6 F (37 C)  98.7 F (37.1 C)   TempSrc: Axillary  Axillary   SpO2: 91% 92% 94% 95%  Weight:      Height:        Intake/Output Summary (Last 24 hours) at 02/28/2020 0731 Last data filed at 02/28/2020 0300 Gross per 24 hour  Intake 1010 ml  Output 550 ml  Net 460 ml   Filed Weights   01/25/2020 2139 02/27/20 0408  Weight: 89.4 kg 90.1 kg   General appearance: Awake alert.  In no distress. Hard of hearing Resp: Normal effort at rest.  Diminished breath sounds without over wheezing, rales, or rhonchi. Cardio: S1-S2 is irregularly irregular.  No S3-S4.  No rubs murmurs or bruit GI: Abdomen is soft.  Nontender nondistended.  Bowel sounds are present normal.  No masses organomegaly Extremities: Right arm 1+ pitting edema/improving.  Neurologic:  No focal neurological deficits.   Lab Results:  Data Reviewed: I have personally reviewed following labs and imaging studies  CBC: Recent Labs  Lab 02/23/20 0954 02/27/20 0130  WBC 17.0* 14.8*  HGB 12.7 10.9*  HCT 38.3 33.0*  MCV 87.4 88.5  PLT 235 007*   Basic Metabolic Panel: Recent Labs  Lab 02/22/20 0047 02/23/20 0954 02/27/20 0130  NA 133* 133* 135  K 3.9 3.8 3.6  CL 97* 96* 97*  CO2 26 27 29   GLUCOSE 156* 183* 85  BUN 27* 30* 23  CREATININE 0.73 0.78 0.65  CALCIUM 7.5* 7.7* 7.6*   GFR: Estimated Creatinine Clearance: 68.4 mL/min (by C-G formula based on SCr of 0.65  mg/dL).  Liver Function Tests: No results for input(s): AST, ALT, ALKPHOS, BILITOT, PROT, ALBUMIN in the last 168 hours. CBG: Recent Labs  Lab 02/26/20 2156 02/27/20 0913 02/27/20 1202 02/27/20 1625 02/27/20 2045  GLUCAP 82 83 140* 186* 167*   No results found for this or any previous visit (from the past 240 hour(s)).   Radiology Studies: No results found.   LOS: 20 days   [facility] Pager: Secure chat/epic messenger  02/28/2020, 7:31 AM

## 2020-02-29 DIAGNOSIS — Z515 Encounter for palliative care: Secondary | ICD-10-CM

## 2020-02-29 DIAGNOSIS — Z66 Do not resuscitate: Secondary | ICD-10-CM

## 2020-02-29 LAB — GLUCOSE, CAPILLARY
Glucose-Capillary: 113 mg/dL — ABNORMAL HIGH (ref 70–99)
Glucose-Capillary: 143 mg/dL — ABNORMAL HIGH (ref 70–99)
Glucose-Capillary: 126 mg/dL — ABNORMAL HIGH (ref 70–99)
Glucose-Capillary: 137 mg/dL — ABNORMAL HIGH (ref 70–99)

## 2020-02-29 MED ORDER — FUROSEMIDE 20 MG PO TABS
20.0000 mg | ORAL_TABLET | Freq: Every day | ORAL | Status: DC
  Filled 2020-02-29: qty 1

## 2020-02-29 NOTE — Progress Notes (Signed)
Occupational Therapy Treatment Patient Details Name: Diane Mendoza MRN: [id number] DOB: 01-14-1946 Today's Date: 02/29/2020    History of present illness Pt is a 75 y.o. female who tested (+) COVID-19 on 02/02/20, now admitted 02/07/2020 with worsening weakness, new onset afib with RVR, GIB. Workup for sepsis, acute hypoxic respiratory failure due to COVID-19 PNA. Pt with worsening hypoxia requiring transfer to ICU. PMH includes HTN, PVD, cervical CA, arthritis.   OT comments  Pt on 70L, 100% Fi02 via heated  HiFlo and Sp02 at rest 78-79%.  With HOB elevated, she performed 2 sets 10 reps shoulder flexion bil. Actively, pulled herself forward away from the bed using bedrails x 2 for up to 3 seconds each.  Sp02 decreased to 73-76% with activity and HR max 140.  Pt fatigued with activity.  Encouraged her to perform 2-3 reps bil. Shoulder flexion with each commercial break.  She is not currently progressing toward goals due to increased 02 requirements and fatigue.    Follow Up Recommendations  SNF;LTACH    Equipment Recommendations  3 in 1 bedside commode;Other (comment)    Recommendations for Other Services      Precautions / Restrictions Precautions Precautions: Fall;Other (comment) Precaution Comments: watch SpO2 and HR       Mobility Bed Mobility               General bed mobility comments: Pt able to pull self forward into long sitting x 2 with rails for 3 seconds each attempt - fatigued  Transfers                      Balance                                           ADL either performed or assessed with clinical judgement   ADL Overall ADL's : Needs assistance/impaired Eating/Feeding: Set up;Bed level                                           Vision       Perception     Praxis      Cognition Arousal/Alertness: Awake/alert Behavior During Therapy: Flat affect Overall Cognitive Status: No family/caregiver present to  determine baseline cognitive functioning                                 General Comments: Pt with poor awareness of severity of deficits.        Exercises Exercises: Other exercises General Exercises - Upper Extremity Shoulder Flexion: AROM;Both;10 reps;Supine (x 2 sets) Other Exercises Other Exercises: encouraged pt to perform 2-3 reps bil. shoulder flexion with each commercial break   Shoulder Instructions       General Comments Pt on 70L 100% Fi02 via heated HiFlo + NRB with Sp02 78-79% at rest, HR 104.  Sp02 decreased to 73-76% with activity and HR increased to 140 max    Pertinent Vitals/ Pain       Pain Assessment: No/denies pain  Home Living  Prior Functioning/Environment              Frequency  Min 3X/week        Progress Toward Goals  OT Goals(current goals can now be found in the care plan section)  Progress towards OT goals: Not progressing toward goals - comment (due to increased 02 requirements and fatigue with activity)     Plan Discharge plan remains appropriate    Co-evaluation                 AM-PAC OT "6 Clicks" Daily Activity     Outcome Measure   Help from another person eating meals?: A Little Help from another person taking care of personal grooming?: A Lot Help from another person toileting, which includes using toliet, bedpan, or urinal?: Total Help from another person bathing (including washing, rinsing, drying)?: Total Help from another person to put on and taking off regular upper body clothing?: Total Help from another person to put on and taking off regular lower body clothing?: Total 6 Click Score: 9    End of Session Equipment Utilized During Treatment: Oxygen  OT Visit Diagnosis: Unsteadiness on feet (R26.81)   Activity Tolerance Patient limited by fatigue   Patient Left in bed;with call bell/phone within reach   Nurse Communication  Mobility status        Time: 6301-6010 OT Time Calculation (min): 12 min  Charges: OT General Charges $OT Visit: 1 Visit OT Treatments $Therapeutic Activity: 8-22 mins  Nilsa Nutting., OTR/L Acute Rehabilitation Services Pager [phone] Office [phone]    Lucille Passy M 02/29/2020, 5:10 PM

## 2020-02-29 NOTE — Progress Notes (Signed)
Physical Therapy Treatment Patient Details Name: Diane Mendoza MRN: [id number] DOB: 07/14/1945 Today's Date: 02/29/2020    History of Present Illness Pt is a 75 y.o. female who tested (+) COVID-19 on 02/02/20, now admitted 02/07/2020 with worsening weakness, new onset afib with RVR, GIB. Workup for sepsis, acute hypoxic respiratory failure due to COVID-19 PNA. Pt on high levels of oxygen. PMH includes HTN, PVD, cervical CA, arthritis.    PT Comments    Spoke with nursing prior to session and nurse reports palliative conversations have been approached but pt declining and wanting "to fight."  Pt on 70L 100% Fi02 via heated HiFlo + NRB with Sp02 78-79% at rest.  Activity was very limited due to poor/unstable respiratory status.  Only able to perform minimal bed exercises with frequent rest breaks.  Continue to advance as able at this time.    Follow Up Recommendations  SNF;Supervision/Assistance - 24 hour     Equipment Recommendations  Rolling walker with 5" wheels    Recommendations for Other Services       Precautions / Restrictions Precautions Precautions: Fall;Other (comment) Precaution Comments: watch SpO2 and HR    Mobility  Bed Mobility               General bed mobility comments: Unable to attempt due to unstable vitals/no respiratory reserve.  She was in elevated position in bed and performed 2 long sits with OT earlier  Transfers                 General transfer comment: deferred  Ambulation/Gait                 Stairs             Wheelchair Mobility    Modified Rankin (Stroke Patients Only)       Balance                                            Cognition Arousal/Alertness: Awake/alert Behavior During Therapy: WFL for tasks assessed/performed Overall Cognitive Status: No family/caregiver present to determine baseline cognitive functioning                           Safety/Judgement: Decreased  awareness of safety;Decreased awareness of deficits     General Comments: Pt with poor awareness of severity of deficits.      Exercises  General Exercises - Lower Extremity Ankle Circles/Pumps: AROM;Both;Supine;10 reps Short Arc Quad: AROM;Both;5 reps;Supine Heel Slides: AROM;Both;Supine;5 reps Hip ABduction/ADduction: AROM;Supine;5 reps;Both  Required rest breaks after each exercise for O2 and HR recovery.  Also assisted pt with taking sips of water 3 separate occasions - requiring rest breaks to recover.    General Comments General comments (skin integrity, edema, etc.): Pt on 70L 100% Fi02 via heated HiFlo + NRB with Sp02 78-79% at rest, HR 104. Sp02 decreased to 73-76% with activity and HR increased to 130 max      Pertinent Vitals/Pain Pain Assessment: No/denies pain    Home Living                      Prior Function            PT Goals (current goals can now be found in the care plan section) Acute Rehab PT Goals Patient Stated Goal: to be able  to do more PT Goal Formulation: With patient Time For Goal Achievement: 03/09/20 Potential to Achieve Goals: Poor Progress towards PT goals: Not progressing toward goals - comment (limited due to unstable resp status)    Frequency    Min 2X/week      PT Plan Current plan remains appropriate    Co-evaluation              AM-PAC PT "6 Clicks" Mobility   Outcome Measure  Help needed turning from your back to your side while in a flat bed without using bedrails?: A Little Help needed moving from lying on your back to sitting on the side of a flat bed without using bedrails?: A Little Help needed moving to and from a bed to a chair (including a wheelchair)?: A Lot Help needed standing up from a chair using your arms (e.g., wheelchair or bedside chair)?: A Lot Help needed to walk in hospital room?: A Lot Help needed climbing 3-5 steps with a railing? : Total 6 Click Score: 13    End of Session  Equipment Utilized During Treatment: Oxygen Activity Tolerance: Treatment limited secondary to medical complications (Comment) Patient left: in bed;with call bell/phone within reach;with bed alarm set Nurse Communication: Mobility status PT Visit Diagnosis: Unsteadiness on feet (R26.81);Other abnormalities of gait and mobility (R26.89);Muscle weakness (generalized) (M62.81);Difficulty in walking, not elsewhere classified (R26.2)     Time: 3295-1884 PT Time Calculation (min) (ACUTE ONLY): 17 min  Charges:  $Therapeutic Exercise: 8-22 mins                     Abran Richard, PT Acute Rehab Services Pager [phone] [facility] 02/29/2020, 5:46 PM

## 2020-02-29 NOTE — Progress Notes (Signed)
PROGRESS NOTE  Diane Mendoza PJK:[id number] DOB: Jun 22, 1945 DOA: 01/21/2020  PCP: Pcp, No  Brief History/Interval Summary: 75 year old female with past medical history of essential hypertension, peripheral vascular disease, hyperlipidemia who tested positive for Covid on 12/23 presented on 12/29 with worsening weakness. Was started on steroids Remdesivir and baricitinib. Subsequently found to have new onset atrial fibrillation and started on Cardizem infusion. There was also concern for GI bleeding [facility] gastroenterology was consulted.  No endoscopies were performed. Over the course of the hospitalization patient has had increasing oxygen requirements. Transferred to the ICU subsequently - oxygen downtrending, back on the floor - ultimate disposition likely SNF.  Reason for Visit: Pneumonia due to COVID-19 w/ acute respiratory failure with hypoxia  Consultants: Pulmonology [facility]  Procedures: None  Antibiotics: Anti-infectives (From admission, onward)   Start     Dose/Rate Route Frequency Ordered Stop   02/11/20 1000  remdesivir 100 mg in sodium chloride 0.9 % 100 mL IVPB        100 mg 200 mL/hr over 30 Minutes Intravenous Daily 01/24/2020 1426 02/13/20 0956   02/09/2020 1730  azithromycin (ZITHROMAX) 500 mg in sodium chloride 0.9 % 250 mL IVPB        500 mg 250 mL/hr over 60 Minutes Intravenous Every 24 hours 02/03/2020 1636 02/14/20 1816   01/17/2020 1730  cefTRIAXone (ROCEPHIN) 2 g in sodium chloride 0.9 % 100 mL IVPB        2 g 200 mL/hr over 30 Minutes Intravenous Every 24 hours 01/22/2020 1636 02/14/20 1802   02/09/20 1000  remdesivir 100 mg in sodium chloride 0.9 % 100 mL IVPB  Status:  Discontinued       "Followed by" Linked Group Details   100 mg 200 mL/hr over 30 Minutes Intravenous Daily 01/17/2020 2020 01/11/2020 1426   01/11/2020 2030  remdesivir 200 mg in sodium chloride 0.9% 250 mL IVPB  Status:  Discontinued       "Followed by" Linked Group Details   200 mg 580 mL/hr  over 30 Minutes Intravenous Once 02/01/2020 2020 01/24/2020 1426      Subjective/Interval History: Somewhat worsening respiratory sats over the past 72h - clinically patient appears to be at previous baseline (somewhat easily fatigued but non acute).  Lengthy discussion at bedside about need for increased activity and participation with PT.  Assessment/Plan:  Goals of Care - Given worsening hypoxia and respiratory status despite max heated high flow and NRB - family invited to come to the hospital given tenuous status. - Discussed lack of ability to provide more oxygen -we did discuss BiPAP as an option, but noted that prolonged time on the BiPAP is more uncomfortable than a ventilator and given patient does not want to be intubated would not offer BiPAP as a temporizing option. - Lambert Keto and [name] (and patient herself) agree that if patient continues to worsen or become unstable we would focus on comfort measures. - Palliative care following - family had lengthy discussion with them and patient 02/29/20 - continue current management  Acute Hypoxic Resp. Failure/Pneumonia due to COVID-19, worsening Antibacterials: Completed a 5-day course of ceftriaxone and azithromycin Remdesivir: Completed 5-day course of Remdesivir Steroids: Remains on prednisone 20mg  - no longer weaning given worsening respiratory status Diuretics: Furosemide decreased to once daily given poor PO intake and decreasing UOP Inhaled Steroids: On Dulera Baricitinib: 1/1-1/15 PUD Prophylaxis: On PPI DVT Prophylaxis: SCDs only due to GI bleed Chest x-ray previously noted to be improving Leukocytosis likely secondary to steroids  Continue incentive spirometry and mobilization - encouraged proning as tolerated;  Patient poorly tolerating any activity/flutter/incentive - unable to participate with PT due to weakness; unable to sit up or reposition in bed due to profound weakness/dyspnea  Atrial fibrillation with RVR, now rate  controlled Initially required diltiazem drip - transitioned to PO diltiazem TSH noted to be 1.24 Echocardiogram shows normal systolic function with grade 2 diastolic dysfunction No anticoagulation due to concern for GI bleed  Hyperlipidemia Continue atorvastatin  GI bleed with melanotic stools, resolved Gastroenterology previously consulted.  No endoscopy was done due to her tenuous respiratory status.  Continue PPI.  No overt bleeding noted.  Hemoglobin has been stable  Essential hypertension Monitor blood pressure Patient on diltiazem Blood pressure is reasonably well controlled  Hyperglycemia HbA1c 6.2.   Elevated CBGs due to steroids.   Continue SSI and Lantus  Goals of care Conversation held with her daughter and son by critical care medicine Palliative care was also following Patient changed to DNR  Hypokalemia Has been corrected  Obesity Estimated body mass index is 33.05 kg/m as calculated from the following:   Height as of this encounter: 5\' 5"  (1.651 m).   Weight as of this encounter: 90.1 kg.  DVT Prophylaxis: SCDs Code Status: DNR Family Communication: Son/daughter updated at length in hospital about patient's worsening prognosis and respiratory status Disposition Plan: In hospital demise is likely expected if she continues on her current path  Status is: Inpatient  Remains inpatient appropriate because:IV treatments appropriate due to intensity of illness or inability to take PO and Inpatient level of care appropriate due to severity of illness   Dispo: The patient is from: Home             Anticipated d/c is to: To be determined - likely SNF             Anticipated d/c date is: > 3 days             Patient currently: is not medically stable to d/c.  Medications:  Scheduled: . vitamin C  500 mg Oral Daily  . atorvastatin  40 mg Oral Daily  . chlorhexidine  15 mL Mouth Rinse BID  . Chlorhexidine Gluconate Cloth  6 each Topical Daily  .  cholecalciferol  1,000 Units Oral Daily  . diltiazem  60 mg Oral Q6H  . feeding supplement  237 mL Oral BID BM  . furosemide  20 mg Oral BID  . insulin aspart  0-15 Units Subcutaneous TID AC & HS  . mouth rinse  15 mL Mouth Rinse q12n4p  . mometasone-formoterol  2 puff Inhalation BID  . nystatin   Topical TID  . pantoprazole  40 mg Oral BID  . polyethylene glycol  17 g Oral BID  . predniSONE  20 mg Oral Q breakfast  . zinc sulfate  220 mg Oral Daily   [id number], albuterol, chlorpheniramine-HYDROcodone, guaiFENesin-dextromethorphan, hydrOXYzine, labetalol, ondansetron (ZOFRAN) IV, promethazine, sodium chloride, sodium chloride flush  Objective:  Vitals:   02/28/20 2200 02/29/20 0029 02/29/20 0354 02/29/20 0423  BP:  (!) 121/53  (!) 121/54  Pulse: 92 78  75  Resp: (!) 25 18  (!) 21  Temp:  97.9 F (36.6 C)  98.1 F (36.7 C)  TempSrc:  Oral  Axillary  SpO2: (!) 88% (!) 87% (!) 78% (!) 79%  Weight:      Height:        Intake/Output Summary (Last 24 hours) at 02/29/2020 0745 Last  data filed at 02/29/2020 0106 Gross per 24 hour  Intake 50 ml  Output 500 ml  Net -450 ml   Filed Weights   02/06/2020 2139 02/27/20 0408  Weight: 89.4 kg 90.1 kg   General appearance: Awake alert.  In no distress. Hard of hearing Resp: Normal effort at rest.  Diminished breath sounds without over wheezing, rales, or rhonchi. Cardio: S1-S2 is irregularly irregular.  No S3-S4.  No rubs murmurs or bruit GI: Abdomen is soft.  Nontender nondistended.  Bowel sounds are present normal.  No masses organomegaly Extremities: Right arm 1+ pitting edema/improving.  Neurologic:  No focal neurological deficits.   Lab Results:  Data Reviewed: I have personally reviewed following labs and imaging studies  CBC: Recent Labs  Lab 02/23/20 0954 02/27/20 0130  WBC 17.0* 14.8*  HGB 12.7 10.9*  HCT 38.3 33.0*  MCV 87.4 88.5  PLT 235 161*   Basic Metabolic Panel: Recent Labs  Lab 02/23/20 0954  02/27/20 0130  NA 133* 135  K 3.8 3.6  CL 96* 97*  CO2 27 29  GLUCOSE 183* 85  BUN 30* 23  CREATININE 0.78 0.65  CALCIUM 7.7* 7.6*   GFR: Estimated Creatinine Clearance: 68.4 mL/min (by C-G formula based on SCr of 0.65 mg/dL).  Liver Function Tests: No results for input(s): AST, ALT, ALKPHOS, BILITOT, PROT, ALBUMIN in the last 168 hours. CBG: Recent Labs  Lab 02/27/20 2045 02/28/20 0733 02/28/20 1210 02/28/20 1637 02/28/20 2126  GLUCAP 167* 100* 111* 180* 140*   No results found for this or any previous visit (from the past 240 hour(s)).   Radiology Studies: No results found.   LOS: 21 days   [facility] Pager: Secure chat/epic messenger  02/29/2020, 7:45 AM

## 2020-02-29 NOTE — Consult Note (Signed)
Consultation Note Date: 02/29/2020   Patient Name: Diane Mendoza  DOB: 1945-11-04  MRN: [id number]  Age / Sex: 75 y.o., female  PCP: Pcp, No Referring Physician: Little Ishikawa, MD  Reason for Consultation: Establishing goals of care and Psychosocial/spiritual support  HPI/Patient Profile: 75 y.o. female   admitted on 01/24/2020 with  past medical history of essential hypertension, peripheral vascular disease, hyperlipidemia who tested positive for Covid on 12/23 presented on 12/29 with worsening weakness. Was started on steroids Remdesivir and baricitinib. Subsequently found to have new onset atrial fibrillation and started on Cardizem infusion. There was also concern for GI bleeding  gastroenterology was consulted.  No endoscopies were performed. Over the course of the hospitalization patient has had increasing oxygen requirements.   Patient has had   continued physical and functional decline with  full medical support.  Increasing oxygen needs.  Dyspnea on minimal exertion.  High risk for decompensation.  Patient  and family face treatment option decisions, advanced directive decision and anticipatory care needs.    Clinical Assessment and Goals of Care:   This NP Wadie Lessen reviewed medical records, received report from team, assessed the patient and then meet at the patient's bedside along with her son and daughter to discuss diagnosis, prognosis, GOC, EOL wishes disposition and options.   Concept of Palliative Care was introduced as specialized medical care for people and their families living with serious illness.  If focuses on providing relief from the symptoms and stress of a serious illness.  The goal is to improve quality of life for both the patient and the family.  Values and goals of care important to patient and family were attempted to be elicited.  Created space and opportunity for  patient  and family to explore thoughts and feelings regarding current medical situation.  Patient and her family are aware of the seriousness of the current medical situation however at this time patient is hopeful for improvement and wishes to continue with current treatment plan. "I wan to live a little longer"  Patient is able to verbalize and family supports that if the patient has further decline she does not want utilization of BiPAP and has declared herself a DNR/DNI.     A  discussion was had today regarding advanced directives.  Concepts specific to code status, artifical feeding and hydration, physical and functional IV antibiotics and rehospitalization was had.    The difference between a aggressive medical intervention path  and a palliative comfort care path for this patient at this time was had.     MOST form introduced, Hard Choices booklet left for rev iew   Natural trajectory and expectations at EOL were discussed.  Questions and concerns addressed.  Patient  encouraged to call with questions or concerns.     PMT will continue to support holistically.  No documented healthcare power of attorney or advanced care planning documents.  Patient is able to tell me today that she would desire both of her children to make decisions for her  in the event that she cannot make decisions for herself.    SUMMARY OF RECOMMENDATIONS    Code Status/Advance Care Planning:  DNR   No BiPAP use now or into the future.    Palliative Prophylaxis:   Aspiration, Delirium Protocol, Frequent Pain Assessment and Oral Care  Additional Recommendations (Limitations, Scope, Preferences):  No Artificial Feeding  Psycho-social/Spiritual:   Desire for further Chaplaincy support:no  Prognosis:   Unable to determine  Discharge Planning: To Be Determined      Primary Diagnoses: Present on Admission:  COVID-19 virus infection   I have reviewed the medical record, interviewed the  patient and family, and examined the patient. The following aspects are pertinent.  Past Medical History:  Diagnosis Date   Arthritis    Bronchitis    Cancer ([place])    Cervical   GERD (gastroesophageal reflux disease)    With Hiatal Hernia   Heart palpitations    Hyperlipidemia    Hypertension    Peripheral vascular disease ([place])    Pneumonia Jan. 2014   Social History   Socioeconomic History   Marital status: Unknown    Spouse name: Not on file   Number of children: Not on file   Years of education: Not on file   Highest education level: Not on file  Occupational History   Not on file  Tobacco Use   Smoking status: Former Smoker    Quit date: 04/11/2002    Years since quitting: 17.8   Smokeless tobacco: Never Used  Substance and Sexual Activity   Alcohol use: No   Drug use: No   Sexual activity: Not on file  Other Topics Concern   Not on file  Social History Narrative   Not on file   Social Determinants of Health   Financial Resource Strain: Not on file  Food Insecurity: Not on file  Transportation Needs: Not on file  Physical Activity: Not on file  Stress: Not on file  Social Connections: Not on file   Family History  Problem Relation Age of Onset   Cancer Mother        Pancreatic   Heart disease Father        Heart Disease before age 55   Cancer Father    Diabetes Father    Heart attack Father    Heart disease Brother        Heart Disease before age 77   Heart attack Brother    Heart disease Brother    Heart attack Paternal Aunt    Heart attack Paternal Uncle    Stroke Paternal Grandmother    Scheduled Meds:  vitamin C  500 mg Oral Daily   atorvastatin  40 mg Oral Daily   chlorhexidine  15 mL Mouth Rinse BID   Chlorhexidine Gluconate Cloth  6 each Topical Daily   cholecalciferol  1,000 Units Oral Daily   diltiazem  60 mg Oral Q6H   feeding supplement  237 mL Oral BID BM   furosemide  20 mg Oral BID    insulin aspart  0-15 Units Subcutaneous TID AC & HS   mouth rinse  15 mL Mouth Rinse q12n4p   mometasone-formoterol  2 puff Inhalation BID   nystatin   Topical TID   pantoprazole  40 mg Oral BID   polyethylene glycol  17 g Oral BID   predniSONE  20 mg Oral Q breakfast   zinc sulfate  220 mg Oral Daily   Continuous Infusions: PRN Meds:.acetaminophen,  albuterol, chlorpheniramine-HYDROcodone, guaiFENesin-dextromethorphan, hydrOXYzine, labetalol, ondansetron (ZOFRAN) IV, promethazine, sodium chloride, sodium chloride flush Medications Prior to Admission:  Prior to Admission medications   Medication Sig Start Date End Date Taking? Authorizing Provider  acetaminophen (TYLENOL) 325 MG tablet Take 650 mg by mouth every 6 (six) hours as needed for mild pain, moderate pain or fever.   Yes [provider]  ibuprofen (ADVIL) 200 MG tablet Take 600 mg by mouth every 8 (eight) hours as needed for mild pain, moderate pain or headache.   Yes [provider]  fluticasone (FLONASE) 50 MCG/ACT nasal spray Place 1-2 sprays into both nostrils daily for 7 days. Patient not taking: Reported on 02/09/2020 11/09/18 11/16/18  Wieters, Madelynn Done C, PA-C   Allergies  Allergen Reactions   Morphine And Related Other (See Comments)    Confusion and aggressive behavior.   Chromium Rash   Lactose Intolerance (Gi) Diarrhea   Review of Systems  Constitutional: Positive for fatigue.  Respiratory: Positive for cough and shortness of breath.   Neurological: Positive for weakness.    Physical Exam Constitutional:      Appearance: She is ill-appearing.  Cardiovascular:     Rate and Rhythm: Normal rate.  Pulmonary:     Effort: Tachypnea present.  Musculoskeletal:     Comments: Generalized weakness and muscle atrophy  Skin:    General: Skin is warm and dry.  Neurological:     Mental Status: She is alert.     Vital Signs: BP 104/61 (BP Location: Left Arm)    Pulse 92    Temp 98 F (36.7  C) (Axillary)    Resp 20    Ht 5\' 5"  (1.651 m)    Wt 90.1 kg    SpO2 (!) 79%    BMI 33.05 kg/m  Pain Scale: 0-10 POSS *See Group Information*: 2-Acceptable,Slightly drowsy, easily aroused Pain Score: 0-No pain   SpO2: SpO2: (!) 79 % O2 Device:SpO2: (!) 79 % O2 Flow Rate: .O2 Flow Rate (L/min): 70 L/min  IO: Intake/output summary:   Intake/Output Summary (Last 24 hours) at 02/29/2020 1127 Last data filed at 02/29/2020 1021 Gross per 24 hour  Intake 170 ml  Output 500 ml  Net -330 ml    LBM: Last BM Date: 02/26/20 Baseline Weight: Weight: 89.4 kg Most recent weight: Weight: 90.1 kg     Palliative Assessment/Data: 30 % at best    Flowsheet Rows   Flowsheet Row Most Recent Value  Intake Tab   Referral Department Critical care  Unit at Time of Referral ICU  Palliative Care Primary Diagnosis Sepsis/Infectious Disease  Date Notified 02/13/20  Palliative Care Type New Palliative care  Date of Admission 01/17/2020  # of days IP prior to Palliative referral 5  Clinical Assessment   Psychosocial & Spiritual Assessment   Palliative Care Outcomes       Discussed with Dr. Avon Gully and bedside RN Time In: 0900 Time Out: 1015 Time Total: 75 minutes Greater than 50%  of this time was spent counseling and coordinating care related to the above assessment and plan.  Signed by: Wadie Lessen, NP   Please contact Palliative Medicine Team phone at [phone] for questions and concerns.  For individual provider: See Shea Evans

## 2020-03-01 DIAGNOSIS — R0600 Dyspnea, unspecified: Secondary | ICD-10-CM

## 2020-03-01 LAB — COMPREHENSIVE METABOLIC PANEL
ALT: 31 U/L (ref 0–44)
AST: 27 U/L (ref 15–41)
Albumin: 2.2 g/dL — ABNORMAL LOW (ref 3.5–5.0)
Alkaline Phosphatase: 137 U/L — ABNORMAL HIGH (ref 38–126)
Anion gap: 14 (ref 5–15)
BUN: 28 mg/dL — ABNORMAL HIGH (ref 8–23)
CO2: 25 mmol/L (ref 22–32)
Calcium: 7.8 mg/dL — ABNORMAL LOW (ref 8.9–10.3)
Chloride: 101 mmol/L (ref 98–111)
Creatinine, Ser: 0.7 mg/dL (ref 0.44–1.00)
GFR, Estimated: >60 mL/min (ref >60)
Glucose, Bld: 115 mg/dL — ABNORMAL HIGH (ref 70–99)
Potassium: 3.8 mmol/L (ref 3.5–5.1)
Sodium: 140 mmol/L (ref 135–145)
Total Bilirubin: 1.5 mg/dL — ABNORMAL HIGH (ref 0.3–1.2)
Total Protein: 5.2 g/dL — ABNORMAL LOW (ref 6.5–8.1)

## 2020-03-01 LAB — CBC
HCT: 34.4 % — ABNORMAL LOW (ref 36.0–46.0)
Hemoglobin: 11.3 g/dL — ABNORMAL LOW (ref 12.0–15.0)
MCH: 29.5 pg (ref 26.0–34.0)
MCHC: 32.8 g/dL (ref 30.0–36.0)
MCV: 89.8 fL (ref 80.0–100.0)
Platelets: 82 10*3/uL — ABNORMAL LOW (ref 150–400)
RBC: 3.83 MIL/uL — ABNORMAL LOW (ref 3.87–5.11)
RDW: 13 % (ref 11.5–15.5)
WBC: 21.4 10*3/uL — ABNORMAL HIGH (ref 4.0–10.5)
nRBC: 0 % (ref 0.0–0.2)

## 2020-03-01 LAB — C-REACTIVE PROTEIN: CRP: 16.2 mg/dL — ABNORMAL HIGH (ref <1.0)

## 2020-03-01 LAB — D-DIMER, QUANTITATIVE: D-Dimer, Quant: >20 ug/mL-FEU — ABNORMAL HIGH (ref 0.00–0.50)

## 2020-03-01 MED ORDER — HYDROMORPHONE HCL 1 MG/ML PO LIQD
1.0000 mg | ORAL | Status: DC | PRN
  Administered 2020-03-01: 1 mg via ORAL
  Filled 2020-03-01: qty 1

## 2020-03-01 MED ORDER — HYDROMORPHONE HCL 1 MG/ML PO LIQD
1.0000 mg | ORAL | Status: DC | PRN
  Administered 2020-03-01 (×2): 1 mg via ORAL
  Filled 2020-03-01 (×2): qty 1

## 2020-03-01 MED ORDER — LORAZEPAM 2 MG/ML PO CONC
1.0000 mg | ORAL | Status: DC | PRN
  Administered 2020-03-01: 1 mg via ORAL
  Filled 2020-03-01: qty 1

## 2020-03-13 NOTE — Progress Notes (Signed)
OT Cancellation Note  Patient Details Name: Diane Mendoza MRN: [id number] DOB: [birth year]   Cancelled Treatment:    Reason Eval/Treat Not Completed: Medical issues which prohibited therapy.  Pt has transitioned to comfort.  Will sign off at this time.  Nilsa Nutting., OTR/L Acute Rehabilitation Services Pager [phone] Office [phone]   Lucille Passy M 03/07/2020, 10:14 AM

## 2020-03-13 NOTE — Progress Notes (Signed)
PROGRESS NOTE  Diane Mendoza WCB:[id number] DOB: [birth year] DOA: 02/07/2020  PCP: Pcp, No  Brief History/Interval Summary: 75 year old female with past medical history of essential hypertension, peripheral vascular disease, hyperlipidemia who tested positive for Covid on 12/23 presented on 12/29 with worsening weakness. Was started on steroids Remdesivir and baricitinib. Subsequently found to have new onset atrial fibrillation and started on Cardizem infusion. There was also concern for GI bleeding [facility] gastroenterology was consulted.  No endoscopies were performed. Over the course of the hospitalization patient has had increasing oxygen requirements. Transferred to the ICU subsequently - oxygen downtrending, back on the floor - ultimate disposition likely SNF.  Reason for Visit: Pneumonia due to COVID-19 w/ acute respiratory failure with hypoxia  Consultants: Pulmonology [facility]  Procedures: None  Antibiotics: Anti-infectives (From admission, onward)   Start     Dose/Rate Route Frequency Ordered Stop   02/11/20 1000  remdesivir 100 mg in sodium chloride 0.9 % 100 mL IVPB        100 mg 200 mL/hr over 30 Minutes Intravenous Daily 2020/02/15 1426 02/13/20 0956   02/15/2020 1730  azithromycin (ZITHROMAX) 500 mg in sodium chloride 0.9 % 250 mL IVPB        500 mg 250 mL/hr over 60 Minutes Intravenous Every 24 hours 02-15-2020 1636 02/14/20 1816   2020-02-15 1730  cefTRIAXone (ROCEPHIN) 2 g in sodium chloride 0.9 % 100 mL IVPB        2 g 200 mL/hr over 30 Minutes Intravenous Every 24 hours 2020-02-15 1636 02/14/20 1802   02/09/20 1000  remdesivir 100 mg in sodium chloride 0.9 % 100 mL IVPB  Status:  Discontinued       "Followed by" Linked Group Details   100 mg 200 mL/hr over 30 Minutes Intravenous Daily 01/30/2020 2020 February 15, 2020 1426   01/11/2020 2030  remdesivir 200 mg in sodium chloride 0.9% 250 mL IVPB  Status:  Discontinued       "Followed by" Linked Group Details   200 mg 580 mL/hr  over 30 Minutes Intravenous Once 01/31/2020 2020 February 15, 2020 1426      Subjective/Interval History: Markedly worse respiratory status overnight - family called first thing this morning to be at bedside.  Assessment/Plan:   Comfort measures - Patient drastically worsening overnight with profound hypoxia - altered mental status; appears uncomfortable at bedside - tachycardic and tachypneic and minimally responsive - R sided weakness in the setting of either acute stroke or severe hypoxia causing anoxia to parts of the brain - unstable for imaging - not candidate for TPA given current trajectory with respiratory status  - Family at bedside agreed to anxiolytics for patient and ultimately agreed to comfort measures with palliative care today - pain medication on board - DC all tele/monitoring/vitals/labs - wean oxygen over the course of the day  Acute Hypoxic Resp. Failure/Pneumonia due to COVID-19, worsening Atrial fibrillation with RVR, now rate controlled Hyperlipidemia GI bleed with melanotic stools, resolved Essential hypertension Hyperglycemia Hypokalemia Obesity  DVT Prophylaxis: SCDs Code Status: DNR Family Communication: Son/daughter updated at length in hospital about patient's worsening prognosis and respiratory status Disposition Plan: In hospital demise is likely expected if she continues on her current path  Status is: Inpatient  Remains inpatient appropriate because:IV treatments appropriate due to intensity of illness or inability to take PO and Inpatient level of care appropriate due to severity of illness   Dispo: The patient is from: Home  Anticipated d/c is to: To be determined - likely SNF             Anticipated d/c date is: > 3 days             Patient currently: is not medically stable to d/c.  Medications:  Scheduled: . vitamin C  500 mg Oral Daily  . atorvastatin  40 mg Oral Daily  . chlorhexidine  15 mL Mouth Rinse BID  . Chlorhexidine  Gluconate Cloth  6 each Topical Daily  . cholecalciferol  1,000 Units Oral Daily  . diltiazem  60 mg Oral Q6H  . feeding supplement  237 mL Oral BID BM  . furosemide  20 mg Oral Daily  . insulin aspart  0-15 Units Subcutaneous TID AC & HS  . mouth rinse  15 mL Mouth Rinse q12n4p  . mometasone-formoterol  2 puff Inhalation BID  . nystatin   Topical TID  . pantoprazole  40 mg Oral BID  . polyethylene glycol  17 g Oral BID  . predniSONE  20 mg Oral Q breakfast  . zinc sulfate  220 mg Oral Daily   QAS:[id number], albuterol, chlorpheniramine-HYDROcodone, guaiFENesin-dextromethorphan, hydrOXYzine, labetalol, ondansetron (ZOFRAN) IV, promethazine, sodium chloride, sodium chloride flush  Objective:  Vitals:   02/29/20 2238 03/10/2020 0027 02/20/2020 0233 03/06/2020 0427  BP: (!) 120/58 (!) 107/52  (!) 107/56  Pulse: 90 94 (!) 120 90  Resp: (!) 27 (!) 24  (!) 25  Temp: 97.9 F (36.6 C) 97.6 F (36.4 C)  97.9 F (36.6 C)  TempSrc: Axillary Axillary  Axillary  SpO2: (!) 84% (!) 82% (!) 76% (!) 81%  Weight:      Height:        Intake/Output Summary (Last 24 hours) at 02/13/2020 0738 Last data filed at 02/29/2020 1751 Gross per 24 hour  Intake 120 ml  Output 400 ml  Net -280 ml   Filed Weights   02/07/2020 2139 02/27/20 0408  Weight: 89.4 kg 90.1 kg   General appearance: Awake - overt distress Resp: tachypneic, accessory muscle use Cardio: tachycardic GI: Abdomen is soft.  Nontender Extremities: Right arm 1+ pitting edema/hemiparesis with 0 strength against gravity  Lab Results:  Data Reviewed: I have personally reviewed following labs and imaging studies  CBC: Recent Labs  Lab 02/23/20 0954 02/27/20 0130  WBC 17.0* 14.8*  HGB 12.7 10.9*  HCT 38.3 33.0*  MCV 87.4 88.5  PLT 235 211*   Basic Metabolic Panel: Recent Labs  Lab 02/23/20 0954 02/27/20 0130  NA 133* 135  K 3.8 3.6  CL 96* 97*  CO2 27 29  GLUCOSE 183* 85  BUN 30* 23  CREATININE 0.78 0.65  CALCIUM 7.7*  7.6*   GFR: Estimated Creatinine Clearance: 68.4 mL/min (by C-G formula based on SCr of 0.65 mg/dL).  Liver Function Tests: No results for input(s): AST, ALT, ALKPHOS, BILITOT, PROT, ALBUMIN in the last 168 hours. CBG: Recent Labs  Lab 02/28/20 2126 02/29/20 0755 02/29/20 1153 02/29/20 1746 02/29/20 2118  GLUCAP 140* 113* 143* 126* 137*   No results found for this or any previous visit (from the past 240 hour(s)).   Radiology Studies: No results found.   LOS: 22 days   [facility] Pager: Secure chat/epic messenger  02/25/2020, 7:38 AM

## 2020-03-13 NOTE — Death Summary Note (Signed)
Death Summary  Dajanee Voorheis AXE:[id number] DOB: 10-Nov-1945 DOA: February 26, 2020  PCP: Pcp, No  Admit date: 02/26/20 Date of Death: March 19, 2020  Final Diagnoses:  Principal Problem:   COVID-19 virus infection Active Problems:   Sepsis ([place])   Acute hypoxemic respiratory failure ([place])   GI bleed   Atrial fibrillation with rapid ventricular response [facility])  Hospital Course:  75 year old female with past medical history of essential hypertension, peripheral vascular disease, hyperlipidemia who tested positive for Covid on 12/23 presented on 02/26/23 with worsening weakness. Was started on steroids Remdesivir and baricitinib. Subsequently found to have new onset atrial fibrillation and started on Cardizem infusion. There was also concern for GI bleeding [facility] gastroenterology was consulted.  No endoscopies were performed. Over the course of the hospitalization patient has had increasing oxygen requirements.  Respiratory status initially improving but prolonged hospital stay complicated by worsening weakness, fatigue, and severe hypoxia. Patient continued to decline and ultimately died at 18:45 on 03/19/2020 after being made comfort measures - family at bedside during time of passing.  Time: 18:45  Signed:  Little Ishikawa DO  [facility] 03/19/2020, 7:09 PM

## 2020-03-13 NOTE — Progress Notes (Signed)
Patient ID: Diane Mendoza, female   DOB: 11-25-1945, 75 y.o.   MRN: [id number]  This NP visited patient at the bedside as a follow up to  yesterday's [place] for palliative medicine and emotional support  Patient has had further decline over the past 24 hours; worsening respiratory status and altered mental status.  Visited patient at bedside she is tachypneic and with decreased responsiveness, attempts at speech are garbled.  Spoke to both patient's son and daughter regarding the current medical situation..  They understand the seriousness of the patient's current medical situation and the likely short-term prognosis.  Education offered on the difference between aggressive medical intervention path and a palliative comfort path for this patient at this time and the situation was offered.  Both son and daughter verbalized an understanding of the poor prognosis and ultimately they hope for comfort and dignity for their mother at this time.  Decision to shift to a full comfort path is made   Plan of care -DNR/DNI -No artificial feeding or hydration now or in the future - No further diagnostics, no escalation of care -Wean high flow oxygen off, supporting patient only with facemask -Symptom management, discussed with pharmacy    -Dilaudid 1 mg sublingual every 2 hours as needed for pain or dyspnea    -Ativan 1 mg p.o./sublingual every hour as needed for agitation/anxiety -Comfort and dignity allowing for natural death -Prognosis is likely hours to days, expect hospital death   Education offered regarding the Natural trajectory and expectations at end of life   Questions and concerns addressed   Discussed with Dr Avon Gully and Wells Guiles RN  Total time spent on the unit was 45 minutes  Greater than 50% of the time was spent in counseling and coordination of care  Wadie Lessen NP  Palliative Medicine Team Team Phone # [phone] Pager [phone]

## 2020-03-13 DEATH — deceased

## 2020-04-10 DEATH — deceased

## 2022-04-11 IMAGING — CT CT ANGIO CHEST
2 of 6 series · 18 of 46 positions shown · IV contrast (omnipaque)
Comparison: None.

CLINICAL DATA: COVID positive shortness of breath

EXAM:
CT ANGIOGRAPHY CHEST WITH CONTRAST
TECHNIQUE: Multidetector CT imaging of the chest was performed using the
standard protocol during bolus administration of intravenous
contrast. Multiplanar CT image reconstructions and MIPs were
obtained to evaluate the vascular anatomy.
CONTRAST:  75mL OMNIPAQUE IOHEXOL 350 MG/ML SOLN

[Series 6: thins · axial · 0.90mm/px · z∈[+1125,+1351]mm · 15 of 248 slices shown]
[im 11/248  lung]
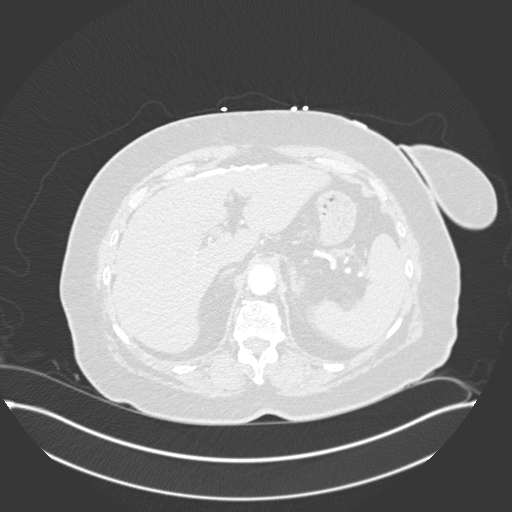
[im 33/248  soft-tissue]
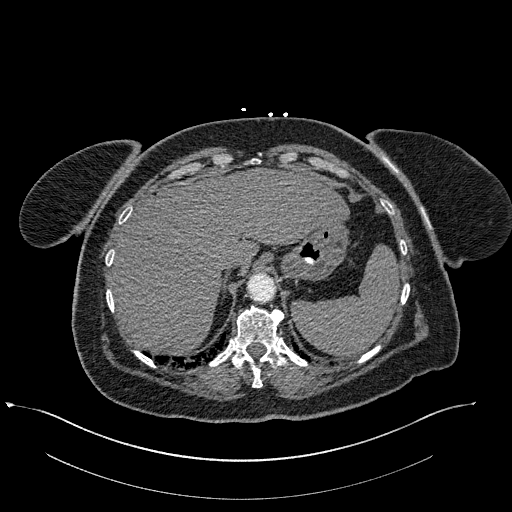
[im 43/248  lung]
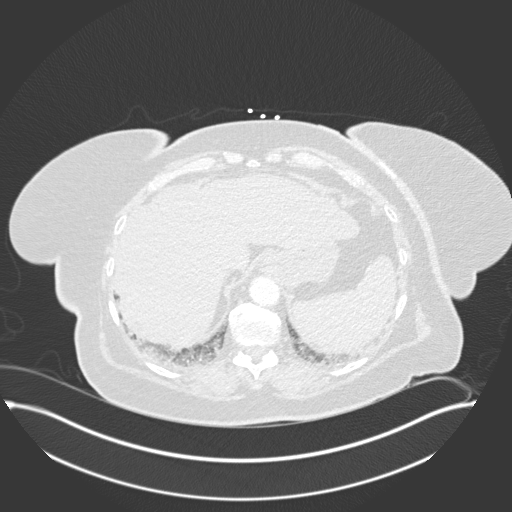
[im 65/248  soft-tissue]
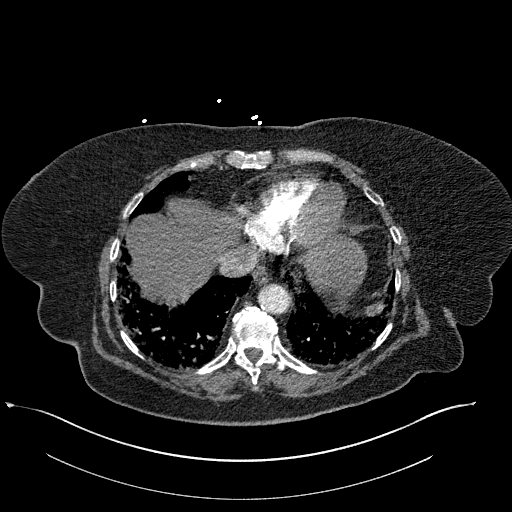
[im 76/248  lung]
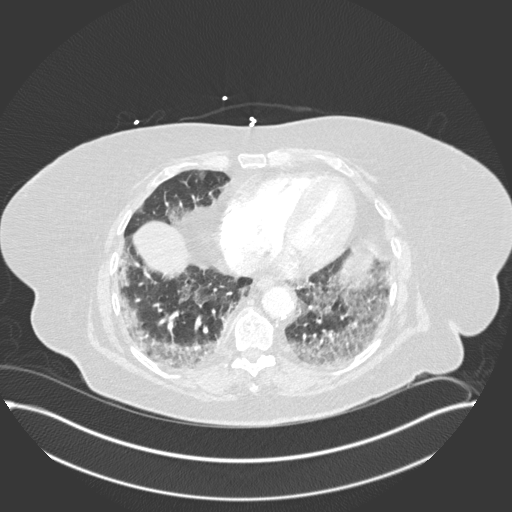
[im 97/248  soft-tissue]
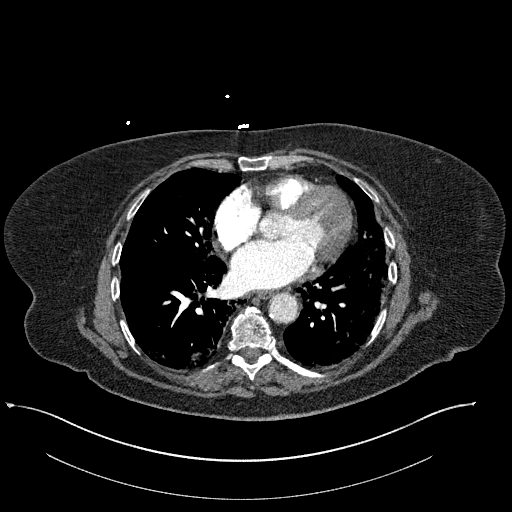
[im 108/248  lung]
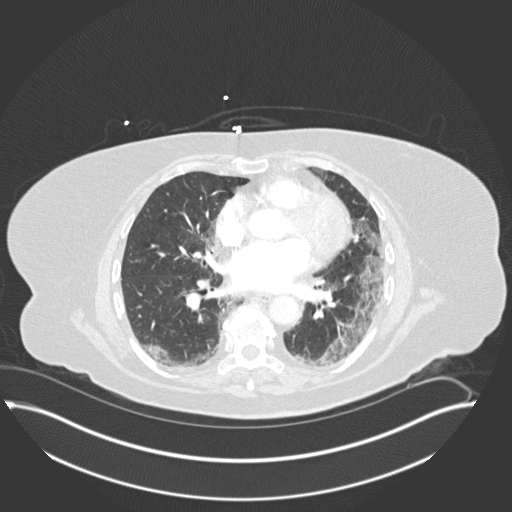
[im 129/248  soft-tissue]
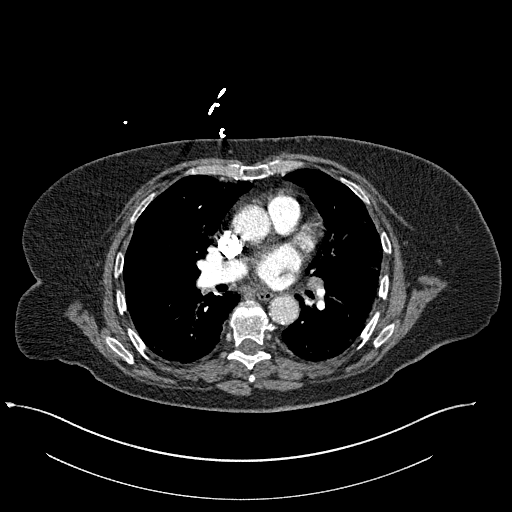
[im 140/248  lung]
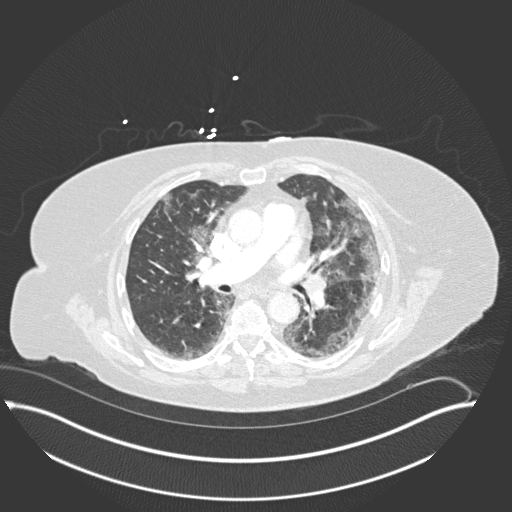
[im 151/248  soft-tissue]
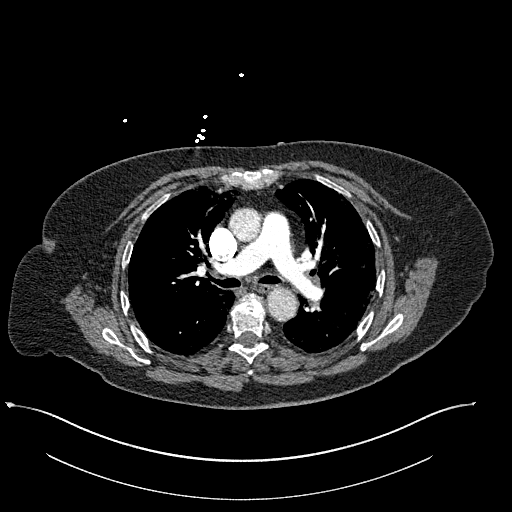
[im 172/248  lung]
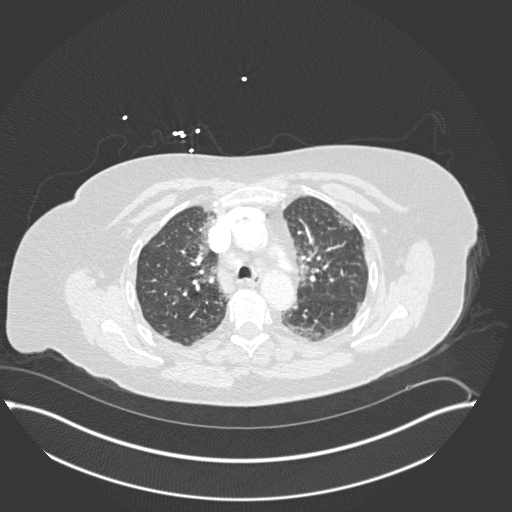
[im 183/248  soft-tissue]
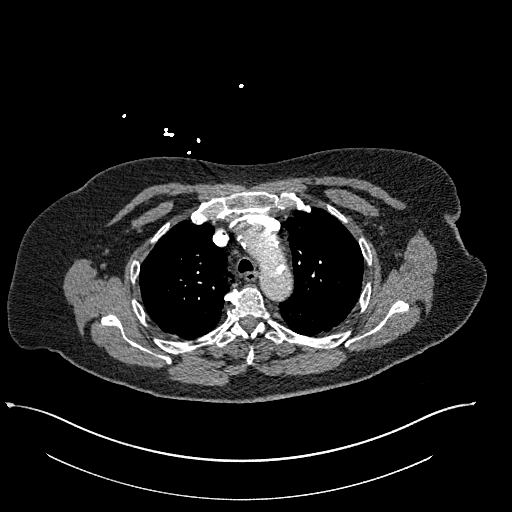
[im 205/248  lung]
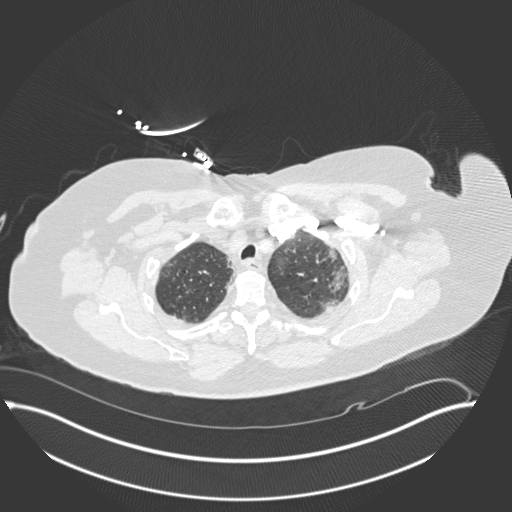
[im 215/248  soft-tissue]
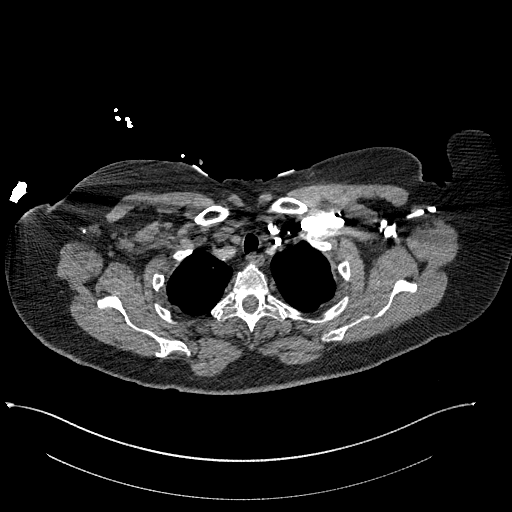
[im 237/248  lung]
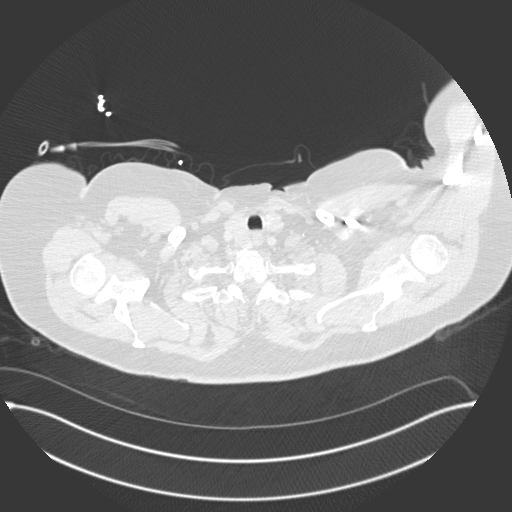

[Series 8: coronal mpr · coronal · 0.50mm/px · 3 of 126 slices shown]
[im 32/126  soft-tissue]
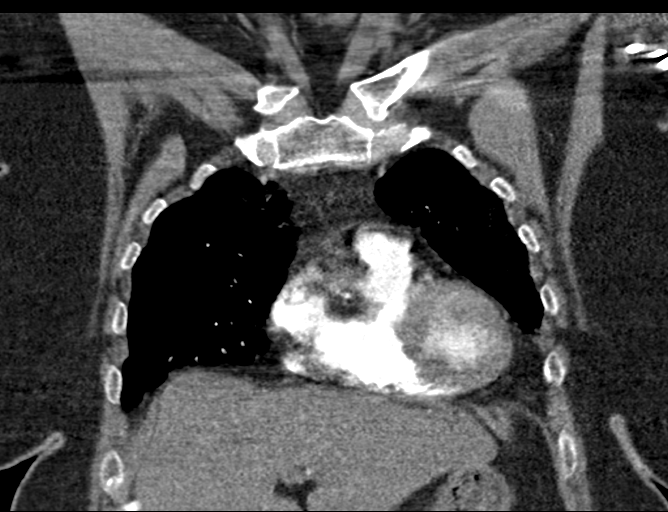
[im 63/126  soft-tissue]
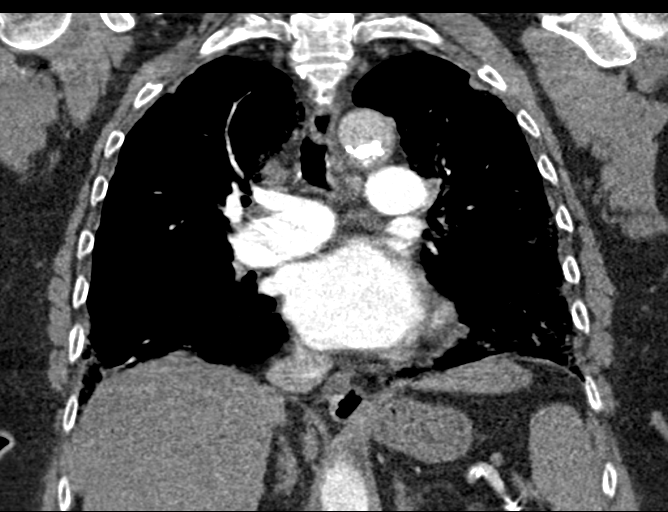
[im 94/126  soft-tissue]
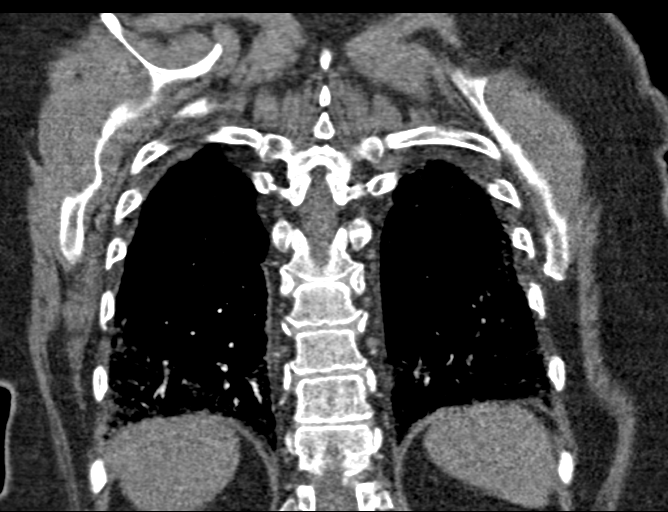

[18 of 46 positions shown; findings below may reference images not displayed]

FINDINGS: Cardiovascular: There is a optimal opacification of the pulmonary
arteries. There is no central,segmental, or subsegmental filling
defects within the pulmonary arteries. The heart is normal in size.
No pericardial effusion or thickening. No evidence right heart
strain. There is normal three-vessel brachiocephalic anatomy without
proximal stenosis. Scattered aortic atherosclerosis is seen.
Coronary artery calcifications are present.

Mediastinum/Nodes: No hilar, mediastinal, or axillary adenopathy.
Thyroid gland, trachea, and esophagus demonstrate no significant
findings.

Lungs/Pleura: Multifocal patchy airspace opacities are seen
predominantly within the periphery of both lungs. This is most
notable at both lung bases. No pleural effusion or pneumothorax.

Upper Abdomen: No acute abnormalities present in the visualized
portions of the upper abdomen.

Musculoskeletal: No chest wall abnormality. No acute or significant
osseous findings.

Review of the MIP images confirms the above findings.
IMPRESSION: No central, segmental, or subsegmental pulmonary embolism.

Multifocal patchy airspace opacities throughout both lungs,
predominantly at both lung bases consistent with atypical viral
pneumonia.

## 2022-04-19 IMAGING — DX DG CHEST 1V PORT
1 series · 1 of 1 positions shown · non-contrast
Comparison: 02/11/2020

CLINICAL DATA: COVID pneumonia

EXAM:
PORTABLE CHEST 1 VIEW

[chest ap]
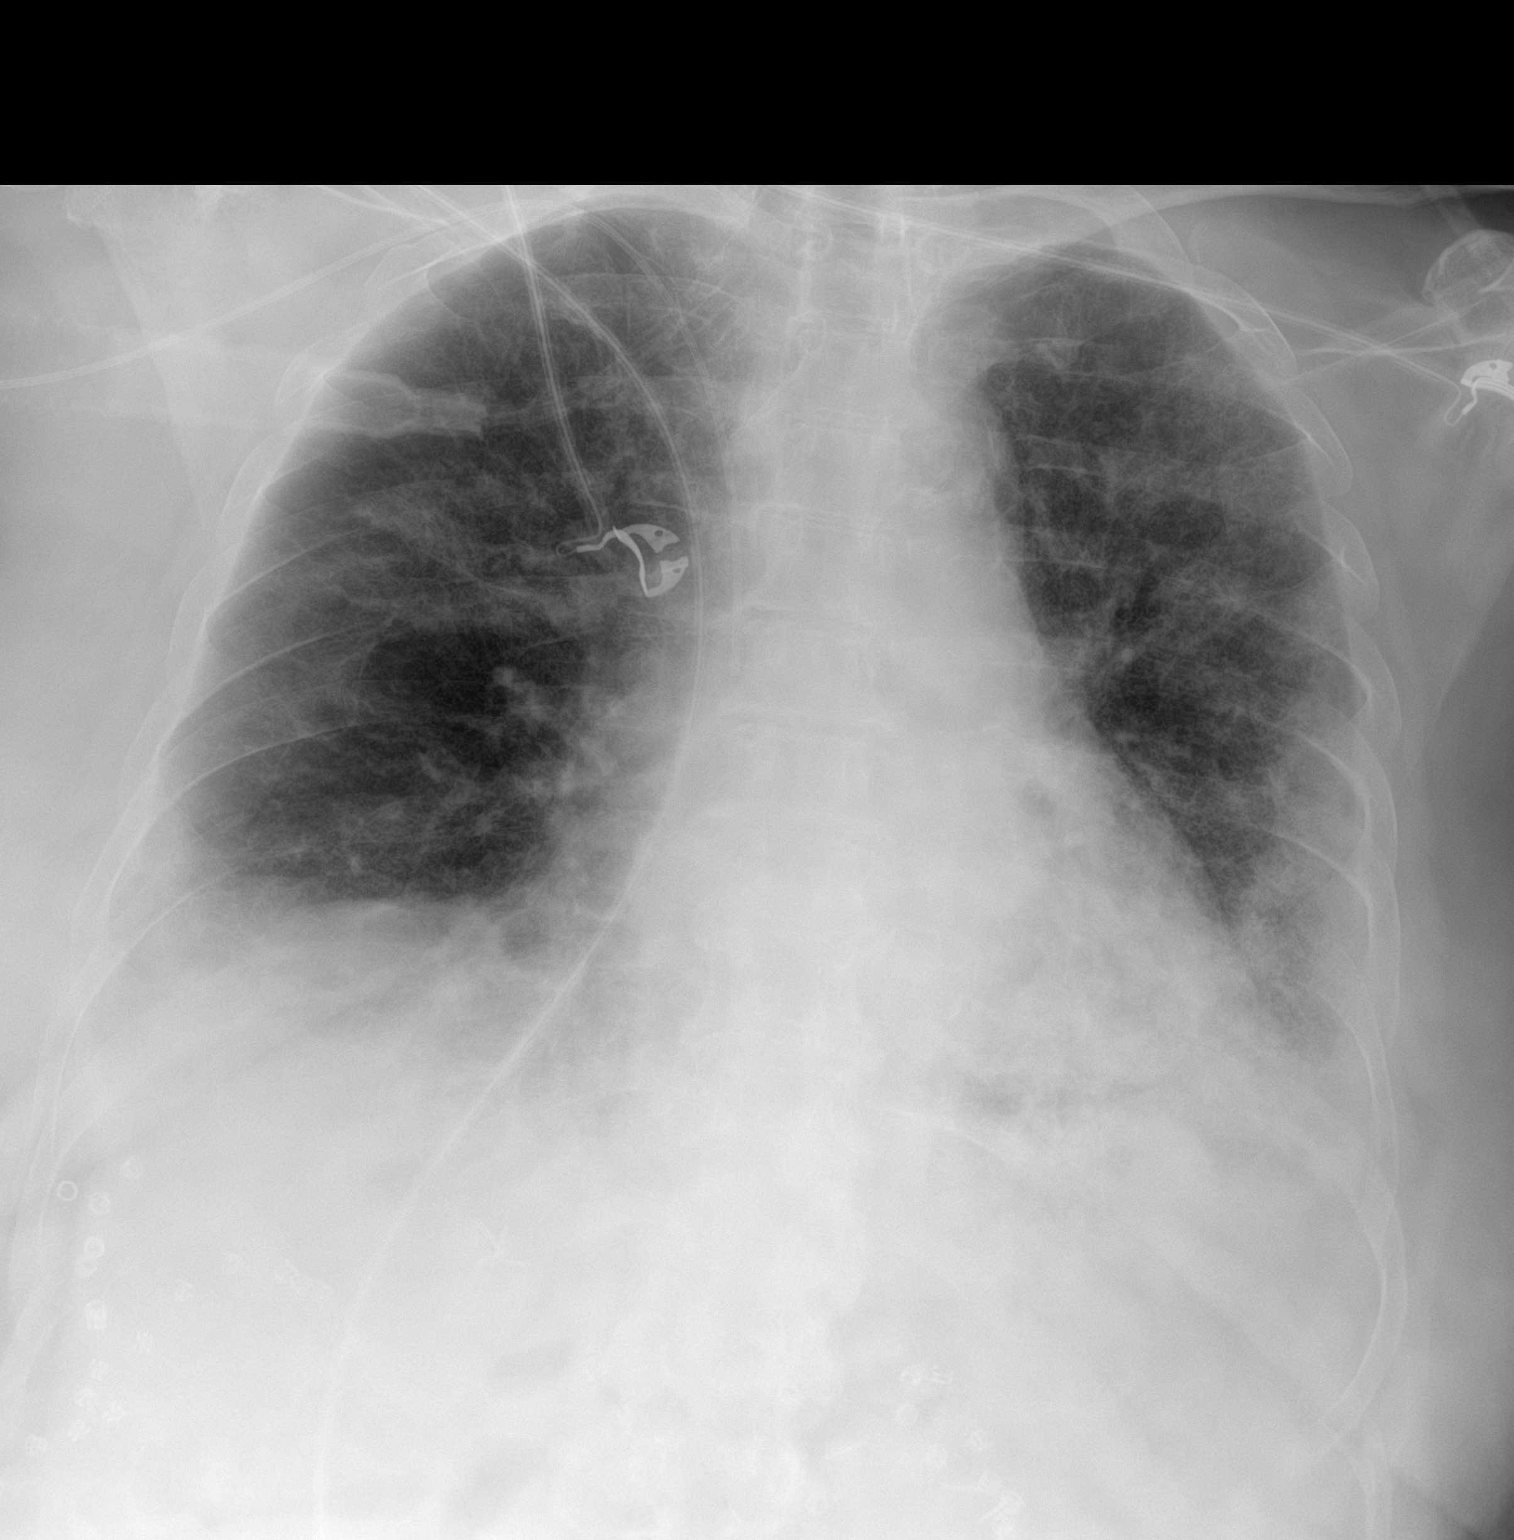

[1 of 1 positions shown; findings below may reference images not displayed]

FINDINGS: Pulmonary insufflation has decreased and lung volumes are small,
though symmetric. Superimposed pulmonary infiltrates appear
improved, particularly within the perihilar and right lower lung
zone. No pneumothorax. Tiny bilateral pleural effusions may be
present. Cardiac size within normal limits. No acute bone
abnormality. Right upper extremity PICC line tip is seen at the
superior cavoatrial junction.
IMPRESSION: Improving multifocal pulmonary infiltrates, likely infectious.

Diminishing pulmonary insufflation.
